# Patient Record
Sex: Female | Born: 1940 | Race: White | Hispanic: No | Marital: Married | Smoking: Current every day smoker
Health system: Southern US, Community
[De-identification: ages and names within clinical notes are randomized; demographics above are authoritative.]

## PROBLEM LIST (undated history)

## (undated) DIAGNOSIS — R000 Tachycardia, unspecified: Secondary | ICD-10-CM

## (undated) DIAGNOSIS — N184 Chronic kidney disease, stage 4 (severe): Secondary | ICD-10-CM

## (undated) DIAGNOSIS — F419 Anxiety disorder, unspecified: Secondary | ICD-10-CM

## (undated) DIAGNOSIS — R51 Headache: Secondary | ICD-10-CM

## (undated) DIAGNOSIS — N390 Urinary tract infection, site not specified: Secondary | ICD-10-CM

## (undated) DIAGNOSIS — I2510 Atherosclerotic heart disease of native coronary artery without angina pectoris: Secondary | ICD-10-CM

## (undated) DIAGNOSIS — Z720 Tobacco use: Secondary | ICD-10-CM

## (undated) DIAGNOSIS — M545 Low back pain, unspecified: Secondary | ICD-10-CM

## (undated) DIAGNOSIS — K219 Gastro-esophageal reflux disease without esophagitis: Secondary | ICD-10-CM

## (undated) DIAGNOSIS — I700 Atherosclerosis of aorta: Secondary | ICD-10-CM

## (undated) DIAGNOSIS — C541 Malignant neoplasm of endometrium: Secondary | ICD-10-CM

## (undated) DIAGNOSIS — F32A Depression, unspecified: Secondary | ICD-10-CM

## (undated) DIAGNOSIS — D72829 Elevated white blood cell count, unspecified: Secondary | ICD-10-CM

## (undated) DIAGNOSIS — C55 Malignant neoplasm of uterus, part unspecified: Secondary | ICD-10-CM

## (undated) DIAGNOSIS — R0602 Shortness of breath: Secondary | ICD-10-CM

## (undated) DIAGNOSIS — Z9981 Dependence on supplemental oxygen: Secondary | ICD-10-CM

## (undated) DIAGNOSIS — G8929 Other chronic pain: Secondary | ICD-10-CM

## (undated) DIAGNOSIS — F329 Major depressive disorder, single episode, unspecified: Secondary | ICD-10-CM

## (undated) DIAGNOSIS — J449 Chronic obstructive pulmonary disease, unspecified: Secondary | ICD-10-CM

## (undated) DIAGNOSIS — R519 Headache, unspecified: Secondary | ICD-10-CM

## (undated) DIAGNOSIS — I739 Peripheral vascular disease, unspecified: Secondary | ICD-10-CM

## (undated) DIAGNOSIS — Z9289 Personal history of other medical treatment: Secondary | ICD-10-CM

## (undated) DIAGNOSIS — K922 Gastrointestinal hemorrhage, unspecified: Secondary | ICD-10-CM

## (undated) DIAGNOSIS — I4510 Unspecified right bundle-branch block: Secondary | ICD-10-CM

## (undated) DIAGNOSIS — E7800 Pure hypercholesterolemia, unspecified: Secondary | ICD-10-CM

## (undated) DIAGNOSIS — I509 Heart failure, unspecified: Secondary | ICD-10-CM

## (undated) DIAGNOSIS — D649 Anemia, unspecified: Secondary | ICD-10-CM

## (undated) DIAGNOSIS — I10 Essential (primary) hypertension: Secondary | ICD-10-CM

## (undated) DIAGNOSIS — I219 Acute myocardial infarction, unspecified: Secondary | ICD-10-CM

## (undated) DIAGNOSIS — M316 Other giant cell arteritis: Secondary | ICD-10-CM

## (undated) DIAGNOSIS — M1990 Unspecified osteoarthritis, unspecified site: Secondary | ICD-10-CM

## (undated) HISTORY — DX: Elevated white blood cell count, unspecified: D72.829

## (undated) HISTORY — DX: Anxiety disorder, unspecified: F41.9

## (undated) HISTORY — DX: Gastro-esophageal reflux disease without esophagitis: K21.9

## (undated) HISTORY — PX: CORONARY ANGIOPLASTY WITH STENT PLACEMENT: SHX49

## (undated) HISTORY — DX: Atherosclerotic heart disease of native coronary artery without angina pectoris: I25.10

## (undated) HISTORY — PX: FEMORAL ARTERY STENT: SHX1583

## (undated) HISTORY — DX: Tachycardia, unspecified: R00.0

## (undated) HISTORY — PX: FOREARM FRACTURE SURGERY: SHX649

## (undated) HISTORY — PX: CAROTID STENT: SHX1301

## (undated) HISTORY — PX: TONSILLECTOMY: SUR1361

## (undated) HISTORY — DX: Dependence on supplemental oxygen: Z99.81

## (undated) HISTORY — DX: Tobacco use: Z72.0

## (undated) HISTORY — DX: Pure hypercholesterolemia, unspecified: E78.00

## (undated) HISTORY — PX: ILIAC ARTERY STENT: SHX1786

## (undated) HISTORY — DX: Unspecified right bundle-branch block: I45.10

## (undated) HISTORY — PX: VAGINAL HYSTERECTOMY: SUR661

## (undated) HISTORY — PX: WRIST FRACTURE SURGERY: SHX121

## (undated) HISTORY — DX: Essential (primary) hypertension: I10

## (undated) HISTORY — DX: Malignant neoplasm of endometrium: C54.1

## (undated) HISTORY — DX: Atherosclerosis of aorta: I70.0

## (undated) HISTORY — DX: Chronic obstructive pulmonary disease, unspecified: J44.9

## (undated) HISTORY — DX: Peripheral vascular disease, unspecified: I73.9

## (undated) HISTORY — PX: REDUCTION MAMMAPLASTY: SUR839

---

## 2000-08-21 ENCOUNTER — Encounter: Payer: Self-pay | Admitting: Family Medicine

## 2000-08-21 ENCOUNTER — Encounter: Admission: RE | Admit: 2000-08-21 | Discharge: 2000-08-21 | Payer: Self-pay | Admitting: Family Medicine

## 2001-10-10 ENCOUNTER — Encounter: Admission: RE | Admit: 2001-10-10 | Discharge: 2001-10-10 | Payer: Self-pay | Admitting: Family Medicine

## 2001-10-10 ENCOUNTER — Encounter: Payer: Self-pay | Admitting: Family Medicine

## 2001-10-23 ENCOUNTER — Encounter: Payer: Self-pay | Admitting: Family Medicine

## 2001-10-23 ENCOUNTER — Ambulatory Visit (HOSPITAL_COMMUNITY): Admission: RE | Admit: 2001-10-23 | Discharge: 2001-10-23 | Payer: Self-pay | Admitting: Family Medicine

## 2001-10-26 ENCOUNTER — Encounter: Payer: Self-pay | Admitting: Family Medicine

## 2001-10-26 ENCOUNTER — Ambulatory Visit (HOSPITAL_COMMUNITY): Admission: RE | Admit: 2001-10-26 | Discharge: 2001-10-26 | Payer: Self-pay | Admitting: Family Medicine

## 2001-11-07 ENCOUNTER — Encounter: Admission: RE | Admit: 2001-11-07 | Discharge: 2001-11-07 | Payer: Self-pay | Admitting: *Deleted

## 2001-11-07 ENCOUNTER — Encounter: Payer: Self-pay | Admitting: *Deleted

## 2001-12-11 ENCOUNTER — Encounter: Admission: RE | Admit: 2001-12-11 | Discharge: 2001-12-11 | Payer: Self-pay | Admitting: Family Medicine

## 2001-12-11 ENCOUNTER — Encounter: Payer: Self-pay | Admitting: Family Medicine

## 2002-02-10 ENCOUNTER — Observation Stay (HOSPITAL_COMMUNITY): Admission: RE | Admit: 2002-02-10 | Discharge: 2002-02-10 | Payer: Self-pay | Admitting: Orthopedic Surgery

## 2002-02-10 ENCOUNTER — Encounter: Payer: Self-pay | Admitting: Orthopedic Surgery

## 2002-06-10 ENCOUNTER — Encounter: Payer: Self-pay | Admitting: *Deleted

## 2002-06-10 ENCOUNTER — Encounter: Admission: RE | Admit: 2002-06-10 | Discharge: 2002-06-10 | Payer: Self-pay | Admitting: *Deleted

## 2006-07-26 ENCOUNTER — Ambulatory Visit (HOSPITAL_COMMUNITY): Admission: RE | Admit: 2006-07-26 | Discharge: 2006-07-26 | Payer: Self-pay | Admitting: Family Medicine

## 2006-11-02 ENCOUNTER — Encounter: Admission: RE | Admit: 2006-11-02 | Discharge: 2006-11-02 | Payer: Self-pay | Admitting: Family Medicine

## 2006-12-20 ENCOUNTER — Encounter (INDEPENDENT_AMBULATORY_CARE_PROVIDER_SITE_OTHER): Payer: Self-pay | Admitting: Gastroenterology

## 2006-12-20 ENCOUNTER — Ambulatory Visit (HOSPITAL_COMMUNITY): Admission: RE | Admit: 2006-12-20 | Discharge: 2006-12-20 | Payer: Self-pay | Admitting: Gastroenterology

## 2007-11-12 ENCOUNTER — Ambulatory Visit: Payer: Self-pay | Admitting: Hematology and Oncology

## 2007-11-13 ENCOUNTER — Ambulatory Visit (HOSPITAL_COMMUNITY): Admission: RE | Admit: 2007-11-13 | Discharge: 2007-11-13 | Payer: Self-pay | Admitting: Hematology and Oncology

## 2007-11-13 LAB — CBC WITH DIFFERENTIAL/PLATELET
BASO%: 0.3 % (ref 0.0–2.0)
Basophils Absolute: 0 10*3/uL (ref 0.0–0.1)
Eosinophils Absolute: 0.1 10*3/uL (ref 0.0–0.5)
HCT: 39.7 % (ref 34.8–46.6)
HGB: 13.5 g/dL (ref 11.6–15.9)
MONO#: 0.7 10*3/uL (ref 0.1–0.9)
NEUT%: 74.1 % (ref 39.6–76.8)
WBC: 10.7 10*3/uL — ABNORMAL HIGH (ref 3.9–10.0)
lymph#: 1.9 10*3/uL (ref 0.9–3.3)

## 2007-11-13 LAB — MORPHOLOGY

## 2007-11-15 LAB — PROTEIN ELECTROPHORESIS, SERUM, WITH REFLEX
Albumin ELP: 52.2 % — ABNORMAL LOW (ref 55.8–66.1)
Alpha-1-Globulin: 5.4 % — ABNORMAL HIGH (ref 2.9–4.9)
Beta 2: 4.6 % (ref 3.2–6.5)
Gamma Globulin: 17.9 % (ref 11.1–18.8)

## 2007-11-15 LAB — COMPREHENSIVE METABOLIC PANEL
CO2: 25 mEq/L (ref 19–32)
Calcium: 9.4 mg/dL (ref 8.4–10.5)
Chloride: 99 mEq/L (ref 96–112)
Creatinine, Ser: 1.41 mg/dL — ABNORMAL HIGH (ref 0.40–1.20)
Glucose, Bld: 86 mg/dL (ref 70–99)
Total Bilirubin: 0.4 mg/dL (ref 0.3–1.2)

## 2007-11-15 LAB — LACTATE DEHYDROGENASE: LDH: 132 U/L (ref 94–250)

## 2007-11-15 LAB — ANA: Anti Nuclear Antibody(ANA): NEGATIVE

## 2007-11-22 LAB — BCR/ABL

## 2008-02-06 ENCOUNTER — Ambulatory Visit: Payer: Self-pay | Admitting: Vascular Surgery

## 2008-03-21 ENCOUNTER — Encounter: Payer: Self-pay | Admitting: Pulmonary Disease

## 2008-03-21 ENCOUNTER — Ambulatory Visit (HOSPITAL_COMMUNITY): Admission: RE | Admit: 2008-03-21 | Discharge: 2008-03-21 | Payer: Self-pay | Admitting: Cardiovascular Disease

## 2008-04-14 ENCOUNTER — Ambulatory Visit: Payer: Self-pay | Admitting: Cardiology

## 2008-04-15 ENCOUNTER — Inpatient Hospital Stay (HOSPITAL_COMMUNITY): Admission: EM | Admit: 2008-04-15 | Discharge: 2008-04-17 | Payer: Self-pay | Admitting: Emergency Medicine

## 2008-05-08 ENCOUNTER — Encounter (HOSPITAL_COMMUNITY): Admission: RE | Admit: 2008-05-08 | Discharge: 2008-06-04 | Payer: Self-pay | Admitting: Cardiovascular Disease

## 2008-05-20 ENCOUNTER — Ambulatory Visit: Payer: Self-pay | Admitting: *Deleted

## 2008-06-11 ENCOUNTER — Ambulatory Visit: Payer: Self-pay | Admitting: Vascular Surgery

## 2008-07-24 ENCOUNTER — Ambulatory Visit: Payer: Self-pay | Admitting: Pulmonary Disease

## 2008-07-24 DIAGNOSIS — I509 Heart failure, unspecified: Secondary | ICD-10-CM | POA: Insufficient documentation

## 2008-07-24 DIAGNOSIS — E785 Hyperlipidemia, unspecified: Secondary | ICD-10-CM

## 2008-07-24 DIAGNOSIS — J438 Other emphysema: Secondary | ICD-10-CM

## 2008-07-24 DIAGNOSIS — I10 Essential (primary) hypertension: Secondary | ICD-10-CM | POA: Insufficient documentation

## 2008-07-24 DIAGNOSIS — J9610 Chronic respiratory failure, unspecified whether with hypoxia or hypercapnia: Secondary | ICD-10-CM | POA: Insufficient documentation

## 2008-07-25 ENCOUNTER — Inpatient Hospital Stay (HOSPITAL_BASED_OUTPATIENT_CLINIC_OR_DEPARTMENT_OTHER): Admission: RE | Admit: 2008-07-25 | Discharge: 2008-07-25 | Payer: Self-pay | Admitting: Cardiovascular Disease

## 2008-07-28 ENCOUNTER — Telehealth (INDEPENDENT_AMBULATORY_CARE_PROVIDER_SITE_OTHER): Payer: Self-pay | Admitting: *Deleted

## 2008-08-15 ENCOUNTER — Ambulatory Visit: Payer: Self-pay | Admitting: Vascular Surgery

## 2008-08-21 ENCOUNTER — Ambulatory Visit: Payer: Self-pay | Admitting: Pulmonary Disease

## 2008-08-30 ENCOUNTER — Encounter: Payer: Self-pay | Admitting: Pulmonary Disease

## 2008-12-10 ENCOUNTER — Ambulatory Visit: Payer: Self-pay | Admitting: Vascular Surgery

## 2008-12-29 ENCOUNTER — Ambulatory Visit: Payer: Self-pay | Admitting: Pulmonary Disease

## 2009-06-10 LAB — LIPID PANEL
Cholesterol: 161 mg/dL (ref 0–200)
Triglycerides: 211 mg/dL — AB (ref 40–160)

## 2009-06-29 ENCOUNTER — Ambulatory Visit: Payer: Self-pay | Admitting: Pulmonary Disease

## 2009-07-01 ENCOUNTER — Ambulatory Visit: Payer: Self-pay | Admitting: Vascular Surgery

## 2009-12-08 ENCOUNTER — Encounter: Admission: RE | Admit: 2009-12-08 | Discharge: 2009-12-08 | Payer: Self-pay | Admitting: Family Medicine

## 2009-12-31 ENCOUNTER — Ambulatory Visit: Payer: Self-pay | Admitting: Internal Medicine

## 2009-12-31 DIAGNOSIS — Z8542 Personal history of malignant neoplasm of other parts of uterus: Secondary | ICD-10-CM

## 2010-01-07 ENCOUNTER — Ambulatory Visit: Admission: RE | Admit: 2010-01-07 | Discharge: 2010-01-07 | Payer: Self-pay | Admitting: Gynecologic Oncology

## 2010-01-07 ENCOUNTER — Telehealth: Payer: Self-pay | Admitting: Pulmonary Disease

## 2010-01-28 ENCOUNTER — Ambulatory Visit: Payer: Self-pay | Admitting: Vascular Surgery

## 2010-02-09 ENCOUNTER — Ambulatory Visit (HOSPITAL_COMMUNITY): Admission: RE | Admit: 2010-02-09 | Discharge: 2010-02-09 | Payer: Self-pay | Admitting: Gynecology

## 2010-02-23 ENCOUNTER — Ambulatory Visit (HOSPITAL_COMMUNITY): Admission: RE | Admit: 2010-02-23 | Discharge: 2010-02-24 | Payer: Self-pay | Admitting: Obstetrics & Gynecology

## 2010-02-23 ENCOUNTER — Encounter: Payer: Self-pay | Admitting: Obstetrics & Gynecology

## 2010-03-15 ENCOUNTER — Ambulatory Visit: Payer: Self-pay | Admitting: Cardiovascular Disease

## 2010-04-08 ENCOUNTER — Ambulatory Visit: Admission: RE | Admit: 2010-04-08 | Discharge: 2010-04-08 | Payer: Self-pay | Admitting: Gynecologic Oncology

## 2010-06-30 ENCOUNTER — Ambulatory Visit: Admit: 2010-06-30 | Payer: Self-pay | Admitting: Pulmonary Disease

## 2010-07-06 ENCOUNTER — Ambulatory Visit: Payer: Self-pay | Admitting: Cardiovascular Disease

## 2010-07-06 NOTE — Assessment & Plan Note (Signed)
Summary: rov for emphysema   Copy to:  [facility] Primary Provider/Referring Provider:  Doristine Counter  CC:  Pt is here for a 6 month f/u appt.  Pt states breathing is unchanged from last visit.  Pt denied any complaints with cough.  Pt denied any new complaints.  Pt is currently smoking 2 cigs per day.  Pt states she is currently only using neb tx once a day but is going to restart bid.  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  She is still smoking, and still not using her nebs compliantly.  She denies any recent acute exacerbation.  She denies signficant cough, purulence, congestion, or worsening sob.  She feels her exertional tolerance is at her usual baseline.  Current Medications (verified): 1)  Adult Aspirin Ec Low Strength 81 Mg Tbec (Aspirin) .... Take 1 Tablet By Mouth Once A Day 2)  Plavix 75 Mg Tabs (Clopidogrel Bisulfate) .... Take 1 Tablet By Mouth Once A Day 3)  Diltiazem Hcl Cr 240 Mg Xr24h-Cap (Diltiazem Hcl) .... Take 1 Tablet By Mouth Once A Day 4)  Effexor Xr 75 Mg Xr24h-Cap (Venlafaxine Hcl) .... Take 1 Tablet By Mouth Once A Day 5)  Hydrochlorothiazide 12.5 Mg Tabs (Hydrochlorothiazide) .... Take 1 Tablet By Mouth Once A Day 6)  Carvedilol 6.25 Mg Tabs (Carvedilol) .... Take 1 Tablet By Mouth Two Times A Day 7)  Nitroglycerin 0.4 Mg Subl (Nitroglycerin) .... As Needed 8)  Ventolin Hfa 108 (90 Base) Mcg/act Aers (Albuterol Sulfate) .... Inhale 2 Puffs Every 4 Hours As Needed 9)  Spiriva Handihaler 18 Mcg  Caps (Tiotropium Bromide Monohydrate) .... One Puff in Handihaler Daily 10)  Brovana 15 Mcg/16ml Nebu (Arformoterol Tartrate) .Marland Kitchen.. 1 in Nebulizer Once Daily 11)  Pulmicort 0.5 Mg/15ml Susp (Budesonide) .Marland Kitchen.. 1 in Nebulizer Once Daily 12)  Pepcid Ac 10 Mg Tabs (Famotidine) .... Take 1 Tablet By Mouth Once A Day 13)  Stool Softner 14)  Laxative  Otc 15)  Tylenol Arthritis Pain 650 Mg Cr-Tabs  (Acetaminophen) 16)  Advil 17)  Nicoderm Cq 21 Mg/24hr Pt24 (Nicotine) .... Take As Directed  Allergies (verified): 1)  ! Tetracycline 2)  ! * Ivp Dye  Social History: Patient states former smoker. Quit smoking 11/09.  Smoked 50+ years 1ppd.  Pt restarted smoking July 2010.  2 cigs per day. 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    Review of Systems      See HPI  Vital Signs:  Patient profile:   70 year old female Height:      68.5 inches Weight:      213.38 pounds BMI:     32.09 O2 Sat:      96 % on 3 LPM pulsed Temp:     98.2 degrees F oral Pulse rate:   85 / minute BP sitting:   130 / 62  (right arm) Cuff size:   large  Vitals Entered By: Arman Filter LPN (June 29, 2009 10:13 AM)  O2 Flow:  3 LPM pulsed CC: Pt is here for a 6 month f/u appt.  Pt states breathing is unchanged from last visit.  Pt denied any complaints with cough.  Pt denied any new complaints.  Pt is currently smoking 2 cigs per day.  Pt states she is currently only using neb tx once a day but is going to restart bid.   Comments Medications reviewed  with patient Arman Filter LPN  June 29, 2009 10:23 AM    Physical Exam  General:  ow female in nad Nose:  no drainage noted. Lungs:  decreased bs throughout, no wheezing or rhonchi Heart:  rrr, no mrg Extremities:  1+ ankle edema, no cyanosis Neurologic:  alert and oriented, moves all 4.   Impression & Recommendations:  Problem # 1:  EMPHYSEMA (ICD-492.8)  the pt has emphysema with chronic respiratory failure.  She is near her usual baseline, but unfortunately is still smoking and not staying compliant with her meds.  We are doing everything we can medically, and further improvement will only come with behavioral changes.  She will f/u with me in 6mos.  Medications Added to Medication List This Visit: 1)  Brovana 15 Mcg/34ml Nebu (Arformoterol tartrate) .Marland Kitchen.. 1 in nebulizer once daily 2)  Pulmicort 0.5 Mg/38ml Susp (Budesonide) .Marland Kitchen..  1 in nebulizer once daily 3)  Nicoderm Cq 21 Mg/24hr Pt24 (Nicotine) .... Take as directed  Other Orders: Est. Patient Level III (28413)  Patient Instructions: 1)  stop smoking 100% 2)  stay on nebs two times a day, along with other meds. 3)  consider pulmonary rehab program 4)  followup with me in 6mos.   Immunization History:  Influenza Immunization History:    Influenza:  historical (03/06/2009)  Pneumovax Immunization History:    Pneumovax:  historical (06/07/2007)

## 2010-07-06 NOTE — Progress Notes (Signed)
Summary: surgical clearance- lmtcb x 1   Phone Note From Other Clinic Call back at [phone]   Caller: gyn oncology ( nancy Call For: clance Summary of Call: pt need surgical clearance Initial call taken by: Rickard Patience,  January 07, 2010 4:31 PM  Follow-up for Phone Call        pt was last seen by [facility] on 12-31-2009.  called and spoke with nurse, Harriett Sine, who states pt has endometrial cancer and they are wanting to do a vaginal hysterectomy.  Surgery has not been scheduled yet.  Harriett Sine is aware KC out of office this week and will return next week.  Harriett Sine was ok for this message to wait until Dr. Shelle Iron returned to the office next week.  Arman Filter LPN  January 07, 2010 4:44 PM   Additional Follow-up for Phone Call Additional follow up Details #1::        megan, she was cleared in my last note.  Can fax that to them. Additional Follow-up by: Barbaraann Share MD,  January 08, 2010 9:14 AM    Additional Follow-up for Phone Call Additional follow up Details #2::    [facility]- we just need a fax # Vernie Murders  January 08, 2010 9:28 AM   called and fax number is [phone]----the last ov note has been faxed over for surgical clearance---ok per [facility]. Randell Loop CMA  January 08, 2010 10:21 AM

## 2010-07-06 NOTE — Assessment & Plan Note (Signed)
Summary: rov for emphysema   Copy to:  [facility] Primary Provider/Referring Provider:  Doristine Counter  CC:  Pt is here for a 6 month f/u appt on her emphysema.   Pt states breathing is unchanged from last visit.  still has sob with exertion.  Pt c/o chest congestion and coughing up white sputum.  Pt states she has increased smoking but refused to tell me how much.  Pt states she has recently been dx with uterine cancer. Marland Kitchen  History of Present Illness: the pt comes in today for f/u of her known emphysema with chronic respiratory failure.  She is staying compliant with her med changes and her oxygen therapy.  Unfortunately, she continues to smoke, and has a chronic cough with white foamy mucus.  She has recently been diagnosed with uterine cancer, and may need hysterectomy.  Current Medications (verified): 1)  Adult Aspirin Ec Low Strength 81 Mg Tbec (Aspirin) .... Take 1 Tablet By Mouth Once A Day 2)  Plavix 75 Mg Tabs (Clopidogrel Bisulfate) .... Take 1 Tablet By Mouth Once A Day 3)  Diltiazem Hcl Cr 240 Mg Xr24h-Cap (Diltiazem Hcl) .... Take 1 Tablet By Mouth Once A Day 4)  Effexor Xr 75 Mg Xr24h-Cap (Venlafaxine Hcl) .... Take 1 Tablet By Mouth Once A Day 5)  Hydrochlorothiazide 12.5 Mg Tabs (Hydrochlorothiazide) .... Take 1 Tablet By Mouth Once A Day 6)  Carvedilol 6.25 Mg Tabs (Carvedilol) .... Take 1 Tablet By Mouth Two Times A Day 7)  Nitroglycerin 0.4 Mg Subl (Nitroglycerin) .... As Needed 8)  Ventolin Hfa 108 (90 Base) Mcg/act Aers (Albuterol Sulfate) .... Inhale 2 Puffs Every 4 Hours As Needed 9)  Spiriva Handihaler 18 Mcg  Caps (Tiotropium Bromide Monohydrate) .... One Puff in Handihaler Daily 10)  Brovana 15 Mcg/71ml Nebu (Arformoterol Tartrate) .Marland Kitchen.. 1 Vial in Nebulizer Two Times A Day 11)  Pulmicort 0.5 Mg/94ml Susp (Budesonide) .Marland Kitchen.. 1 Vial Via Nebulizer Two Times A Day 12)  Pepcid Ac 10 Mg Tabs (Famotidine) .... Take 1 Tablet By Mouth Once A Day As Needed 13)  Stool Softner 14)  Laxative   Otc 15)  Tylenol Arthritis Pain 650 Mg Cr-Tabs (Acetaminophen) 16)  Advil 17)  Iron 325 (65 Fe) Mg Tabs (Ferrous Sulfate) .... Take 1 Tablet By Mouth Once A Day 18)  Vitamin C 500 Mg Tabs (Ascorbic Acid) .... Take 1 Tablet By Mouth Once A Day 19)  Vitamin D 500 .... Take 1 Tablet By Mouth Once A Day  Allergies (verified): 1)  ! Tetracycline 2)  ! * Ivp Dye  Past History:  Past Medical History: Emphysema Hyperlipidemia Hypertension Myocardial Infarction/ PCI 11/09 by [facility]--s/p stenting iliacs UTERINE CANCER (ICD-V10.42)  Review of Systems       The patient complains of shortness of breath with activity, productive cough, acid heartburn, indigestion, headaches, and hand/feet swelling.  The patient denies shortness of breath at rest, non-productive cough, coughing up blood, chest pain, irregular heartbeats, loss of appetite, weight change, abdominal pain, difficulty swallowing, sore throat, tooth/dental problems, nasal congestion/difficulty breathing through nose, sneezing, itching, ear ache, anxiety, depression, joint stiffness or pain, rash, change in color of mucus, and fever.    Vital Signs:  Patient profile:   70 year old female Height:      68.5 inches Weight:      212.25 pounds BMI:     31.92 O2 Sat:      98 % on 3 LPM pulsed Temp:     97.8 degrees F  oral Pulse rate:   82 / minute BP sitting:   130 / 70  (right arm) Cuff size:   large  Vitals Entered By: Arman Filter LPN (December 31, 2009 3:58 PM)  O2 Flow:  3 LPM pulsed CC: Pt is here for a 6 month f/u appt on her emphysema.   Pt states breathing is unchanged from last visit.  still has sob with exertion.  Pt c/o chest congestion and coughing up white sputum.  Pt states she has increased smoking but refused to tell me how much.  Pt states she has recently been dx with uterine cancer.  Comments Medications reviewed with patient Arman Filter LPN  December 31, 2009 3:58 PM    Physical Exam  General:  obese female  in nad Lungs:  decreased bs throughout, no wheezing or rhonchi Heart:  rrr Extremities:  mild LE edema, no cyanosis Neurologic:  alert and oriented, moves all 4.   Impression & Recommendations:  Problem # 1:  EMPHYSEMA (ICD-492.8)  the pt has known emphysema, and is on a very good bronchodilator regimen.  She continues to smoke, and understands that she will not improve until she totally quits.  I have asked her to stay on current meds, and to work on smoking cessation.  She has no pulmonary contraindications for hysterectomy, but is at moderate to high risk for postop pulmonary complications.  Problem # 2:  CHRONIC RESPIRATORY FAILURE (ICD-518.83)  the pt has adequate sats on current supplemental oxygen.     Medications Added to Medication List This Visit: 1)  Pepcid Ac 10 Mg Tabs (Famotidine) .... Take 1 tablet by mouth once a day as needed 2)  Iron 325 (65 Fe) Mg Tabs (Ferrous sulfate) .... Take 1 tablet by mouth once a day 3)  Vitamin C 500 Mg Tabs (Ascorbic acid) .... Take 1 tablet by mouth once a day 4)  Vitamin D 500  .... Take 1 tablet by mouth once a day  Other Orders: Est. Patient Level III (16109)  Patient Instructions: 1)  no change in meds. 2)  stop smoking.  Don't forget those who stop smoking 2 weeks prior to general anesthesia have better outcomes. 3)  followup with me in 6mos.

## 2010-07-14 ENCOUNTER — Encounter: Payer: Self-pay | Admitting: Pulmonary Disease

## 2010-07-14 ENCOUNTER — Ambulatory Visit (INDEPENDENT_AMBULATORY_CARE_PROVIDER_SITE_OTHER): Payer: Federal, State, Local not specified - PPO | Admitting: Pulmonary Disease

## 2010-07-14 DIAGNOSIS — J438 Other emphysema: Secondary | ICD-10-CM

## 2010-07-14 DIAGNOSIS — J9610 Chronic respiratory failure, unspecified whether with hypoxia or hypercapnia: Secondary | ICD-10-CM

## 2010-07-16 ENCOUNTER — Telehealth (INDEPENDENT_AMBULATORY_CARE_PROVIDER_SITE_OTHER): Payer: Self-pay | Admitting: *Deleted

## 2010-07-22 ENCOUNTER — Ambulatory Visit (INDEPENDENT_AMBULATORY_CARE_PROVIDER_SITE_OTHER): Payer: Federal, State, Local not specified - PPO | Admitting: Cardiovascular Disease

## 2010-07-22 DIAGNOSIS — I119 Hypertensive heart disease without heart failure: Secondary | ICD-10-CM

## 2010-07-22 DIAGNOSIS — J449 Chronic obstructive pulmonary disease, unspecified: Secondary | ICD-10-CM

## 2010-07-22 NOTE — Assessment & Plan Note (Signed)
Summary: rov for emphysema, chronic respiratory failure   Copy to:  [facility] Primary Provider/Referring Provider:  Doristine Counter  CC:  6 month f/u appt for Emphysema.   Pt requesting 90 day rx for Spiriva and Brovana and Pulmicort.  And also requests just a 30 day supply for Ventolin.  Currently still smoking "at least 10 cigs a day."   C/o increased sob with exertion and coughing up clear to yellow thick sputum. Marland Kitchen  History of Present Illness: the pt comes in today for f/u of her known emphysema with chronic respiratory failure.  She is staying on her bronchodilator regimen, but is only using once a day instead of prescribed twice a day.  She is staying on her oxygen.  She feels her exertional tolerance is at her usual baseline, and she continues with mild cough productive of scant mucus due to ongoing smoking.    Preventive Screening-Counseling & Management  Alcohol-Tobacco     Smoking Status: current     Smoking Cessation Counseling: yes     Packs/Day: 0.5     Tobacco Counseling: to quit use of tobacco products  Current Medications (verified): 1)  Adult Aspirin Ec Low Strength 81 Mg Tbec (Aspirin) .... Take 1 Tablet By Mouth Once A Day 2)  Plavix 75 Mg Tabs (Clopidogrel Bisulfate) .... Take 1 Tablet By Mouth Once A Day 3)  Diltiazem Hcl Cr 240 Mg Xr24h-Cap (Diltiazem Hcl) .... Take 1 Tablet By Mouth Once A Day 4)  Effexor Xr 75 Mg Xr24h-Cap (Venlafaxine Hcl) .... Take 1 Tablet By Mouth Once A Day 5)  Hydrochlorothiazide 12.5 Mg Tabs (Hydrochlorothiazide) .... Take 1 Tablet By Mouth Once A Day 6)  Carvedilol 6.25 Mg Tabs (Carvedilol) .... Take 1 Tablet By Mouth Two Times A Day 7)  Nitroglycerin 0.4 Mg Subl (Nitroglycerin) .... As Needed 8)  Ventolin Hfa 108 (90 Base) Mcg/act Aers (Albuterol Sulfate) .... Inhale 2 Puffs Every 4 Hours As Needed 9)  Spiriva Handihaler 18 Mcg  Caps (Tiotropium Bromide Monohydrate) .... One Puff in Handihaler Daily 10)  Brovana 15 Mcg/35ml Nebu (Arformoterol  Tartrate) .Marland Kitchen.. 1 Vial in Nebulizer Once Daily 11)  Pulmicort 0.5 Mg/62ml Susp (Budesonide) .Marland Kitchen.. 1 Vial Via Nebulizer Once Daily 12)  Pepcid Ac 10 Mg Tabs (Famotidine) .... Take 1 Tablet By Mouth Once A Day As Needed 13)  Stool Softner 14)  Laxative  Otc 15)  Tylenol Arthritis Pain 650 Mg Cr-Tabs (Acetaminophen) 16)  Advil 17)  Vitamin C 500 Mg Tabs (Ascorbic Acid) .... Take 1 Tablet By Mouth Once A Day 18)  Vitamin D 500 .... Take 1 Tablet By Mouth Once A Day 19)  Amlodipine Besylate 5 Mg Tabs (Amlodipine Besylate) .... Take 1/2 Tab By Mouth Daily 20)  Allopurinol 100 Mg Tabs (Allopurinol) .... Take 1 Tablet By Mouth Two Times A Day  Allergies (verified): 1)  ! Tetracycline 2)  ! * Ivp Dye  Social History: Patient states former smoker. Quit smoking 11/09.  Smoked 50+ years 1ppd.  Pt restarted smoking July 2010.  10 cigs a day. 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   Smoking Status:  current Packs/Day:  0.5  Review of Systems       The patient complains of shortness of breath with activity, productive cough, acid heartburn, indigestion, loss of appetite, weight change, anxiety, hand/feet swelling, and change in color of mucus.  The patient denies shortness of breath at rest, non-productive cough, coughing up blood, chest pain, irregular heartbeats, abdominal pain,  difficulty swallowing, sore throat, tooth/dental problems, headaches, nasal congestion/difficulty breathing through nose, sneezing, itching, ear ache, depression, joint stiffness or pain, rash, and fever.    Vital Signs:  Patient profile:   70 year old female Height:      68.5 inches Weight:      196.13 pounds BMI:     29.49 O2 Sat:      96 % on 3 LPM pulsed Temp:     97.7 degrees F oral Pulse rate:   81 / minute BP sitting:   134 / 72  (right arm) Cuff size:   large  Vitals Entered By: Arman Filter LPN (July 14, 2010 10:49 AM)  O2 Flow:  3 LPM pulsed CC: 6 month f/u appt for Emphysema.   Pt  requesting 90 day rx for Spiriva, Brovana and Pulmicort.  And also requests just a 30 day supply for Ventolin.  Currently still smoking "at least 10 cigs a day."   C/o increased sob with exertion and coughing up clear to yellow thick sputum.  Comments Medications reviewed with patient Arman Filter LPN  July 14, 2010 10:50 AM    Physical Exam  General:  wd female in nad Lungs:  decreased bs throughout, no wheezing noted. Heart:  rrr, no mrg Extremities:  mild ankle edema, no cyanosis  Neurologic:  alert and oriented, moves all 4.   Impression & Recommendations:  Problem # 1:  EMPHYSEMA (ICD-492.8)  the pt has known emphysema with chronic respiratory failure.  She is on a very good bronchodilator regimen, but unfortunately continues to smoke.  She understands that her breathing will never improve until she quits smoking, and that she is prone to recurrent pulmonary infections as well.  I have asked her to take her neb treatments twice a day , and to stay as active as possible.    Medications Added to Medication List This Visit: 1)  Brovana 15 Mcg/67ml Nebu (Arformoterol tartrate) .Marland Kitchen.. 1 vial in nebulizer once daily 2)  Pulmicort 0.5 Mg/39ml Susp (Budesonide) .Marland Kitchen.. 1 vial via nebulizer once daily 3)  Amlodipine Besylate 5 Mg Tabs (Amlodipine besylate) .... Take 1/2 tab by mouth daily 4)  Allopurinol 100 Mg Tabs (Allopurinol) .... Take 1 tablet by mouth two times a day  Other Orders: Tobacco use cessation intermediate 3-10 minutes (99406) Est. Patient Level III (04540)  Patient Instructions: 1)  stop smoking! 2)  no change in pulmonary meds, but take brovana/budesonide twice a day. 3)  followup with me in 6mos, or sooner if having issues.   Prescriptions: VENTOLIN HFA 108 (90 BASE) MCG/ACT AERS (ALBUTEROL SULFATE) Inhale 2 puffs every 4 hours as needed  #1 x 11   Entered and Authorized by:   Barbaraann Share MD   Signed by:   Barbaraann Share MD on 07/14/2010   Method used:   Print  then Give to Patient   RxID:   [id number] PULMICORT 0.5 MG/2ML SUSP (BUDESONIDE) 1 vial via nebulizer once daily  #180 x 4   Entered and Authorized by:   Barbaraann Share MD   Signed by:   Barbaraann Share MD on 07/14/2010   Method used:   Print then Give to Patient   RxID:   [id number] 15 MCG/2ML NEBU (ARFORMOTEROL TARTRATE) 1 vial in nebulizer once daily  #180 x 4   Entered and Authorized by:   Barbaraann Share MD   Signed by:   Barbaraann Share MD on 07/14/2010  Method used:   Print then Give to Patient   RxID:   [id number] SPIRIVA HANDIHALER 18 MCG  CAPS (TIOTROPIUM BROMIDE MONOHYDRATE) one puff in handihaler daily  #90 x 4   Entered and Authorized by:   Barbaraann Share MD   Signed by:   Barbaraann Share MD on 07/14/2010   Method used:   Print then Give to Patient   RxID:   [id number]

## 2010-07-22 NOTE — Progress Notes (Signed)
Summary: clarrification on med instructions  Phone Note Call from Patient Call back at Home Phone [phone]   Caller: Spouse Roger  Call For: clance Summary of Call: spouse wants to know if there is a generic for spiriva avail through [employer]. ALSO: wants to know if there is a generic (or a cheaper version) of pulmicort, ALSO: wants clarification re: directions for brovana as he thought pt was to take 2 vials daily.  Initial call taken by: Tivis Ringer, CNA,  July 16, 2010 11:54 AM  Follow-up for Phone Call        Called, spoke with pt's spouse, Fredrik Cove.  He is requesting clarrification on brovana and budesonide directions.  Per pt instructions from 07/14/10, no change in pulmonary meds, but take brovana/budesonide twice a day.  However, the rxs were both written for once daily.  KC, pls advise if pt is supposed to take these meds once or twice daily.  Thanks! Follow-up by: Gweneth Dimitri RN,  July 16, 2010 2:03 PM  Additional Follow-up for Phone Call Additional follow up Details #1::        brovana and budesonide are supposed to be two times a day please send a new rx if needed. Additional Follow-up by: Barbaraann Share MD,  July 16, 2010 4:31 PM    Additional Follow-up for Phone Call Additional follow up Details #2::    Called, spoke with pt.  She was informed of above per [facility] and verbalized understanding of this.  Requesting to pick up new rxs so they can send them in to mail order when ready.  Rxs printed and signed by [facility].  They were placed at front for pick up -- pt aware. Follow-up by: Gweneth Dimitri RN,  July 16, 2010 4:52 PM  New/Updated Medications: BROVANA 15 MCG/2ML NEBU (ARFORMOTEROL TARTRATE) 1 vial in nebulizer two times a day PULMICORT 0.5 MG/2ML SUSP (BUDESONIDE) 1 vial via nebulizer two times a day Prescriptions: PULMICORT 0.5 MG/2ML SUSP (BUDESONIDE) 1 vial via nebulizer two times a day  #180 x 0   Entered by:   Gweneth Dimitri RN   Authorized by:    Barbaraann Share MD   Signed by:   Gweneth Dimitri RN on 07/16/2010   Method used:   Print then Give to Patient   RxID:   [id number] BROVANA 15 MCG/2ML NEBU (ARFORMOTEROL TARTRATE) 1 vial in nebulizer two times a day  #180 x 0   Entered by:   Gweneth Dimitri RN   Authorized by:   Barbaraann Share MD   Signed by:   Gweneth Dimitri RN on 07/16/2010   Method used:   Print then Give to Patient   RxID:   [id number]

## 2010-08-10 ENCOUNTER — Encounter: Payer: Self-pay | Admitting: Cardiovascular Disease

## 2010-08-10 DIAGNOSIS — Z9981 Dependence on supplemental oxygen: Secondary | ICD-10-CM | POA: Insufficient documentation

## 2010-08-10 DIAGNOSIS — K219 Gastro-esophageal reflux disease without esophagitis: Secondary | ICD-10-CM | POA: Insufficient documentation

## 2010-08-10 DIAGNOSIS — R000 Tachycardia, unspecified: Secondary | ICD-10-CM | POA: Insufficient documentation

## 2010-08-10 DIAGNOSIS — I4510 Unspecified right bundle-branch block: Secondary | ICD-10-CM | POA: Insufficient documentation

## 2010-08-10 DIAGNOSIS — Z720 Tobacco use: Secondary | ICD-10-CM | POA: Insufficient documentation

## 2010-08-10 DIAGNOSIS — I700 Atherosclerosis of aorta: Secondary | ICD-10-CM | POA: Insufficient documentation

## 2010-08-10 DIAGNOSIS — J449 Chronic obstructive pulmonary disease, unspecified: Secondary | ICD-10-CM | POA: Insufficient documentation

## 2010-08-10 DIAGNOSIS — F419 Anxiety disorder, unspecified: Secondary | ICD-10-CM | POA: Insufficient documentation

## 2010-08-10 DIAGNOSIS — N289 Disorder of kidney and ureter, unspecified: Secondary | ICD-10-CM | POA: Insufficient documentation

## 2010-08-10 DIAGNOSIS — I739 Peripheral vascular disease, unspecified: Secondary | ICD-10-CM | POA: Insufficient documentation

## 2010-08-10 DIAGNOSIS — I2510 Atherosclerotic heart disease of native coronary artery without angina pectoris: Secondary | ICD-10-CM | POA: Insufficient documentation

## 2010-08-18 ENCOUNTER — Ambulatory Visit (INDEPENDENT_AMBULATORY_CARE_PROVIDER_SITE_OTHER): Payer: Federal, State, Local not specified - PPO | Admitting: Nurse Practitioner

## 2010-08-18 ENCOUNTER — Other Ambulatory Visit: Payer: Self-pay | Admitting: Cardiovascular Disease

## 2010-08-18 DIAGNOSIS — I2510 Atherosclerotic heart disease of native coronary artery without angina pectoris: Secondary | ICD-10-CM

## 2010-08-18 DIAGNOSIS — I10 Essential (primary) hypertension: Secondary | ICD-10-CM

## 2010-08-18 DIAGNOSIS — N189 Chronic kidney disease, unspecified: Secondary | ICD-10-CM

## 2010-08-18 DIAGNOSIS — N289 Disorder of kidney and ureter, unspecified: Secondary | ICD-10-CM

## 2010-08-19 LAB — CBC
HCT: 31.8 % — ABNORMAL LOW (ref 36.0–46.0)
HCT: 32.8 % — ABNORMAL LOW (ref 36.0–46.0)
Hemoglobin: 10.6 g/dL — ABNORMAL LOW (ref 12.0–15.0)
MCH: 30 pg (ref 26.0–34.0)
MCH: 30.2 pg (ref 26.0–34.0)
MCHC: 33.6 g/dL (ref 30.0–36.0)
MCV: 89.8 fL (ref 78.0–100.0)
MCV: 90.5 fL (ref 78.0–100.0)
Platelets: 269 10*3/uL (ref 150–400)
RBC: 3.52 MIL/uL — ABNORMAL LOW (ref 3.87–5.11)
RDW: 15.6 % — ABNORMAL HIGH (ref 11.5–15.5)
WBC: 10.7 10*3/uL — ABNORMAL HIGH (ref 4.0–10.5)

## 2010-08-19 LAB — SAMPLE TO BLOOD BANK

## 2010-08-19 LAB — COMPREHENSIVE METABOLIC PANEL
Albumin: 3.6 g/dL (ref 3.5–5.2)
Alkaline Phosphatase: 71 U/L (ref 39–117)
BUN: 21 mg/dL (ref 6–23)
Creatinine, Ser: 1.65 mg/dL — ABNORMAL HIGH (ref 0.4–1.2)
Glucose, Bld: 104 mg/dL — ABNORMAL HIGH (ref 70–99)
Potassium: 4.7 mEq/L (ref 3.5–5.1)
Total Protein: 7.6 g/dL (ref 6.0–8.3)

## 2010-08-19 LAB — DIFFERENTIAL
Basophils Absolute: 0 10*3/uL (ref 0.0–0.1)
Basophils Relative: 0 % (ref 0–1)
Lymphocytes Relative: 14 % (ref 12–46)
Monocytes Relative: 7 % (ref 3–12)
Neutro Abs: 8.3 10*3/uL — ABNORMAL HIGH (ref 1.7–7.7)
Neutrophils Relative %: 77 % (ref 43–77)

## 2010-08-19 LAB — BASIC METABOLIC PANEL
Creatinine, Ser: 1.55 mg/dL — ABNORMAL HIGH (ref 0.4–1.2)
Sodium: 137 mEq/L (ref 135–145)

## 2010-08-19 LAB — SURGICAL PCR SCREEN: MRSA, PCR: NEGATIVE

## 2010-08-19 LAB — CREATININE, SERUM: GFR calc non Af Amer: 36 mL/min — ABNORMAL LOW (ref >60)

## 2010-08-20 ENCOUNTER — Other Ambulatory Visit: Payer: Federal, State, Local not specified - PPO

## 2010-08-23 ENCOUNTER — Other Ambulatory Visit: Payer: Federal, State, Local not specified - PPO

## 2010-08-24 ENCOUNTER — Ambulatory Visit
Admission: RE | Admit: 2010-08-24 | Discharge: 2010-08-24 | Disposition: A | Discharge status: Home / Self Care | Payer: Federal, State, Local not specified - PPO | Source: Ambulatory Visit | Attending: Cardiovascular Disease | Admitting: Cardiovascular Disease

## 2010-08-24 DIAGNOSIS — N289 Disorder of kidney and ureter, unspecified: Secondary | ICD-10-CM

## 2010-08-27 ENCOUNTER — Other Ambulatory Visit: Payer: Self-pay | Admitting: Cardiovascular Disease

## 2010-08-27 DIAGNOSIS — I10 Essential (primary) hypertension: Secondary | ICD-10-CM

## 2010-08-30 ENCOUNTER — Encounter (INDEPENDENT_AMBULATORY_CARE_PROVIDER_SITE_OTHER): Payer: Federal, State, Local not specified - PPO | Admitting: Cardiology

## 2010-08-30 DIAGNOSIS — I10 Essential (primary) hypertension: Secondary | ICD-10-CM

## 2010-09-03 ENCOUNTER — Telehealth: Payer: Self-pay | Admitting: Pulmonary Disease

## 2010-09-03 ENCOUNTER — Encounter: Payer: Self-pay | Admitting: Cardiovascular Disease

## 2010-09-03 ENCOUNTER — Ambulatory Visit (INDEPENDENT_AMBULATORY_CARE_PROVIDER_SITE_OTHER): Payer: Federal, State, Local not specified - PPO | Admitting: Cardiovascular Disease

## 2010-09-03 VITALS — BP 150/70 | HR 72 | Wt 195.0 lb

## 2010-09-03 DIAGNOSIS — I10 Essential (primary) hypertension: Secondary | ICD-10-CM

## 2010-09-03 DIAGNOSIS — I739 Peripheral vascular disease, unspecified: Secondary | ICD-10-CM

## 2010-09-03 DIAGNOSIS — I2510 Atherosclerotic heart disease of native coronary artery without angina pectoris: Secondary | ICD-10-CM

## 2010-09-03 NOTE — Patient Instructions (Addendum)
Stop smoking.  Call us back sooner if the leg pain persists or if you develop discoloration on your feet or toes.

## 2010-09-03 NOTE — Assessment & Plan Note (Signed)
She's not had any episodes of chest pain. I've asked her to stop smoking.

## 2010-09-03 NOTE — Progress Notes (Signed)
History of Present Illness: Wanda Yang is an elderly female with a history of coronary artery disease. She is status post PTCA and stenting of her left circumflex artery as well as a right coronary artery. She also has a history of severe COPD and is on home oxygen. Despite this she continues to smoke. She has a history of hypertension.  She also complains of some leg pain. It was actually her husband who told me about the leg pain. She admits Not been able to walk much because the pain but she really did not want to have anything done about that at this time.  She was seen by a very last week. Because of her high blood pressure she had a renal ultrasound which revealed a small right renal kidney. She was told that it may be nonfunctional.  Current Outpatient Prescriptions on File Prior to Visit  Medication Sig Dispense Refill  . ALBUTEROL IN Inhale into the lungs as needed.        Marland Kitchen arformoterol (BROVANA) 15 MCG/2ML NEBU Take 15 mcg by nebulization 2 (two) times daily.        . Ascorbic Acid (VITAMIN C) 500 MG tablet Take 500 mg by mouth daily.        Marland Kitchen aspirin 81 MG tablet Take 81 mg by mouth daily.        . budesonide (PULMICORT) 0.5 MG/2ML nebulizer solution Take 0.5 mg by nebulization 2 (two) times daily.        . cholecalciferol (VITAMIN D-400) 400 UNITS TABS Take 400 Units by mouth daily.        . clopidogrel (PLAVIX) 75 MG tablet Take 75 mg by mouth daily.        Marland Kitchen diltiazem (DILACOR XR) 240 MG 24 hr capsule Take 240 mg by mouth daily.        . Famotidine (PEPCID PO) Take by mouth as needed.        . nebivolol (BYSTOLIC) 10 MG tablet Take 5 mg by mouth daily.       . nitroGLYCERIN (NITROSTAT) 0.4 MG SL tablet Place 0.4 mg under the tongue every 5 (five) minutes as needed.        . OXYGEN-HELIUM IN Inhale 2 L into the lungs daily.        . rosuvastatin (CRESTOR) 10 MG tablet Take 5 mg by mouth daily.        Marland Kitchen tiotropium (SPIRIVA) 18 MCG inhalation capsule Place 18 mcg into inhaler and inhale  daily.        Marland Kitchen venlafaxine (EFFEXOR) 75 MG tablet Take 75 mg by mouth daily.        Marland Kitchen allopurinol (ZYLOPRIM) 100 MG tablet Take 100 mg by mouth 2 (two) times daily.        Marland Kitchen amLODipine (NORVASC) 5 MG tablet Take 5 mg by mouth daily.        . furosemide (LASIX) 40 MG tablet Take 40 mg by mouth every other day as needed.        . hydrochlorothiazide 25 MG tablet Take 25 mg by mouth daily.        Marland Kitchen HYDROCODONE-HOMATROPINE PO Take by mouth as needed.        . potassium chloride (KLOR-CON) 10 MEQ CR tablet Take 10 mEq by mouth daily.          Allergies  Allergen Reactions  . Ambien   . Uloric (Febuxostat)   . Vicodin (Hydrocodone-Acetaminophen)   . Tetracycline Rash  Past Medical History  Diagnosis Date  . CAD (coronary artery disease)   . Atherosclerosis of abdominal aorta   . On home oxygen therapy   . Tobacco abuse   . HTN (hypertension)   . COPD (chronic obstructive pulmonary disease)   . PVD (peripheral vascular disease)   . Anxiety   . GERD (gastroesophageal reflux disease)   . RBBB (right bundle branch block)   . Tachycardia   . High cholesterol   . Renal insufficiency   . Leukocytosis   . Endometrial carcinoma     Past Surgical History  Procedure Date  . Coronary angioplasty   . Abdominal hysterectomy   . Carotid stent   . Tonsillectomy   . Arm surgery     PLATES AND SCREWS  . Breast reduction surgery     History  Smoking status  . Former Smoker  . Types: Cigarettes  . Quit date: 04/06/2008  Smokeless tobacco  . Not on file    History  Alcohol Use No    Family History  Problem Relation Age of Onset  . Heart attack Father   . Coronary artery disease Father   . Leukemia Mother   . Leukemia Brother     Reviw of Systems:  The patient denies any heat or cold intolerance.  No weight gain or weight loss.  The patient denies headaches or blurry vision.  There is no cough or sputum production.  The patient denies dizziness.  There is no hematuria or  hematochezia.  The patient denies any muscle aches or arthritis.  The patient denies any rash.  The patient denies frequent falling or instability.  There is no history of depression or anxiety.  All other systems were reviewed and are negative.  Physical Exam: BP 150/70  Pulse 72  Wt 195 lb (88.451 kg) The patient is alert and oriented x 3.  The mood and affect are normal.  The skin is warm and dry.  Color is normal.  The HEENT exam reveals that the sclera are nonicteric.  The mucous membranes are moist.  The carotids are 2+ without bruits.  There is no thyromegaly.  There is no JVD.  The lungs are clear.  The chest wall is non tender.  The heart exam reveals a regular rate with a normal S1 and S2.  There are no murmurs, gallops, or rubs.  The PMI is not displaced.   Abdominal exam reveals good bowel sounds.  There is no guarding or rebound.  There is no hepatosplenomegaly or tenderness.  There are no masses.  Exam of the legs reveal no clubbing, cyanosis, or edema.  The legs are without rashes.  The distal pulses Very weak.  Cranial nerves II - XII are intact.  Motor and sensory functions are intact.  The gait is normal.  ECG:  Assessment / Plan:

## 2010-09-03 NOTE — Telephone Encounter (Signed)
Faxed Renal Artery Duplex to Amy at [facility] ([phone]).

## 2010-09-03 NOTE — Assessment & Plan Note (Signed)
Her leg pains are most likely due to claudication. She was not interested in following up with any additional studies at this time. She has seen Dr. Darrick Penna in the past for peripheral vascular disease. I've encouraged her to call him if she has any worsening of her claudication.

## 2010-09-03 NOTE — Assessment & Plan Note (Signed)
Her blood pressure remains elevated. I told her that much of this because she continues to smoke. She has severe peripheral vascular disease I suspect. We will continue with her same medications. I've asked her to watch her salt intake. She has a moderately elevated creatinine and her renal dysfunction is also starting to affect her blood pressure.

## 2010-09-09 ENCOUNTER — Encounter: Payer: Self-pay | Admitting: Cardiovascular Disease

## 2010-09-24 ENCOUNTER — Telehealth: Payer: Self-pay | Admitting: Cardiovascular Disease

## 2010-09-24 NOTE — Telephone Encounter (Signed)
Faxed Renal Arterial Duplex to Diane @ [facility] ([phone]).

## 2010-10-06 ENCOUNTER — Encounter (HOSPITAL_COMMUNITY): Payer: Federal, State, Local not specified - PPO | Attending: Nephrology

## 2010-10-06 DIAGNOSIS — D509 Iron deficiency anemia, unspecified: Secondary | ICD-10-CM | POA: Insufficient documentation

## 2010-10-13 ENCOUNTER — Encounter (HOSPITAL_COMMUNITY): Payer: Federal, State, Local not specified - PPO

## 2010-10-15 ENCOUNTER — Encounter: Payer: Self-pay | Admitting: Cardiovascular Disease

## 2010-10-19 NOTE — Assessment & Plan Note (Signed)
OFFICE VISIT   Wanda Yang, Wanda Yang  DOB:  1940/10/04                                       12/10/2008  [id number]#:[id number]   Wanda Yang returns for followup today.  We have been following her for  bilateral lower extremity claudication.  She also has significant COPD  and now requires 3 liters of oxygen continuously.  Unfortunately, she  continues to smoke.  I cautioned her against smoking during the use of  home oxygen therapy today.  Hopefully, she will not continue to do this  in the future due to the fire risk.   She states that she still has some symptoms in her right leg consistent  with claudication.  This usually occurs at 1 to 2 blocks.  She states  that these usually occurs before shortness of breath.  She does not feel  significantly debilitated by her symptoms but overall she is a little  bit depressed due to her multiple comorbidities and she is not able to  do the things that she wishes she could do secondary to her shortness of  breath and claudication symptoms.   On physical exam today, blood pressure is 148/79 in the right arm, pulse  is 90 and regular.  O2 saturations 94% on 3 liters O2.  Lower extremity  exam shows 2+ femoral pulses bilaterally.  She has 1+ dorsalis pedis  pulse and has absent pedal pulses on the left foot.  She had bilateral  ABIs performed today which were 0.94 on the right and triphasic.  They  were 0.63 on the left and monophasic.   Although Wanda Yang has some complaints of claudication symptoms in  her right leg, her ABIs actually are fairly reasonable in the right leg.  I believe that some of her symptoms in the right leg are probably  multifactorial with musculoskeletal problems contributing as well as  arterial occlusive disease.  The left lower extremity actually has a  significantly lower ABI than the right but she does not complain of  claudication symptoms in this leg.  However, she is overall very  debilitated by her COPD and requires home oxygen.  I would not consider  any revascularization in her unless she had limb-threatening ischemia.  She is certainly not near that at this point.  I believe the best option  for her is continued medical management with risk factor modification.  She will continue to take her aspirin and Plavix.  She will follow up in  6 months' time for repeat ABIs.  She will also try to quit smoking.  Several minutes were devoted today to smoking cessation counseling.   Janetta Hora. Fields, MD  Electronically Signed   CEF/MEDQ  D:  12/10/2008  T:  12/11/2008  Job:  [id number]   cc:   [name], M.D.

## 2010-10-19 NOTE — Discharge Summary (Signed)
Wanda Yang, Wanda Yang              ACCOUNT NO.:  [id number]   MEDICAL RECORD NO.:  [id number]          PATIENT TYPE:  INP   LOCATION:  6522                         FACILITY:  [facility]   PHYSICIAN:  [name], M.D. DATE OF BIRTH:  Dec 08, 1940   DATE OF ADMISSION:  04/15/2008  DATE OF DISCHARGE:  04/17/2008                               DISCHARGE SUMMARY   DISCHARGE DIAGNOSES:  1. Coronary artery disease - status post percutaneous transluminal      coronary angioplasty and stenting of the mid-right coronary artery      and the first obtuse marginal artery.  2. Severe chronic obstructive pulmonary disease.  3. Presumed hyperlipidemia.  4. Hypertension.  5. Long history of cigarette smoking.   DISCHARGE MEDICATIONS:  1. Aspirin 81 mg a day.  2. Plavix 75 mg a day.  3. Nitroglycerin 0.4 mg sublingually as needed.  4. Crestor 20 mg a day.  5. Cardizem CD 240 mg a day.  6. Albuterol HFA as needed.  7. Effexor 75 mg a day.  8. Prednisone 10 mg a day or as otherwise directed by Dr. Ace Gins.  9. Pepcid AC as needed.  10.Hydrochlorothiazide 12.5 mg a day.  11.Coreg 6.25 mg twice a day.   DISPOSITION:  The patient will see Dr. Elease Hashimoto in 1-2 weeks.  She is to  see Dr. Larina Bras in a week or so for her wheezing.   Dr. Larina Bras will also handle the prednisone taper following this week.   HISTORY:  Wanda Yang is a 70 year old female with a long history of  COPD.  She was recently admitted with some chest and throat pain and was  found to have positive cardiac enzymes.  She was admitted for further  evaluation.  Please see dictated H&P for further details.   HOSPITAL COURSE:  1. Coronary artery disease.  The patient had positive cardiac enzymes.      She had a heart catheterization later that day.  She was found to      have an extremely tight right coronary artery, stenosis run off,      and severe left circumflex stenosis.  Her left anterior descending      artery had  minor luminal irregularities.  She underwent successful      PTCA and stenting of her right coronary artery using a 3.0 x 18-mm      Promus stent.  It was postdilated using a 3.25-mm balloon.   The circumflex artery was stented the following day.  We have replaced  another 3.0 x 18-mm stent postdilated using a 3.25-mm balloon.   The patient tolerated the procedure quite well.  She did not have any  significant EKG changes.  She will be followed by Dr. Elease Hashimoto and was  also enrolled in cardiac rehab.  1. Respiratory.  The patient had some wheezing.  During the admission,      we continued the prednisone and gave her albuterol.  She complained      of having some worsening shortness breath for which she took Advair  and the Spiriva.  We will have her see Dr. Larina Bras for further      management of this.      [name], M.D.  Electronically Signed     PJN/MEDQ  D:  04/17/2008  T:  04/17/2008  Job:  [id number]

## 2010-10-19 NOTE — Procedures (Signed)
VASCULAR LAB EXAM   INDICATION:  Leg claudication, history of stenting, evaluate for PAD.   HISTORY:  Diabetes:  No.  Cardiac:  Stenting.  Hypertension:  Yes.  Other:  Known left lower extremity PVD.   EXAM:  Left lower extremity arterial duplex.   IMPRESSION:  1. Biphasic Doppler waveforms noted in the left common femoral and      profunda femoral arteries and monophasic Doppler waveforms noted in      the popliteal and tibial arteries with no increase in velocities.  2. No flow could be detected in the left proximal to distal      superficial femoral artery, which is suggestive of a possible total      occlusion.  3. Preliminary report was faxed to Dr. Harvie Bridge office on May 20, 2008.     ___________________________________________  P. Liliane Bade, M.D.   CH/MEDQ  D:  05/20/2008  T:  05/20/2008  Job:  [id number]

## 2010-10-19 NOTE — H&P (Signed)
Wanda Yang, Wanda Yang              ACCOUNT NO.:  [id number]   MEDICAL RECORD NO.:  [id number]          PATIENT TYPE:  INP   LOCATION:  6522                         FACILITY:  [facility]   PHYSICIAN:  Wanda Friends. Dietrich Pates, MD, FACCDATE OF BIRTH:  [birth year]   DATE OF ADMISSION:  04/14/2008  DATE OF DISCHARGE:                              HISTORY & PHYSICAL   PRIMARY CARE PHYSICIAN:  Wanda Gins, MD   PRIMARY CARDIOLOGIST:  Wanda Mixer, MD   HISTORY OF PRESENT ILLNESS:  A 70 year old woman without known coronary  artery disease with a history of peripheral vascular disease presents  with a probable acute coronary syndrome.  Wanda Yang's vascular  history dates back to 2003 when right and left iliac stents were placed  for claudication.  She has done very well with respect to peripheral  vascular disease since that procedure.  She has severe COPD with 50-pack-  year history of cigarette smoking that continues and is chronically  short of breath with class III symptoms.  She has been followed by Dr.  Elease Yang for history of supraventricular tachycardia.  The exact nature of  her arrhythmia is uncertain.  She has been treated with diltiazem and  low-dose beta-blockers because of her history of bronchospasm with  uncertain control of her arrhythmia.  The night prior to admission, she  developed an episode of tachy palpitations associated with severe neck  pain that was not clearly related to neck position or movement.  There  was an associated increase in her baseline dyspnea.  She had no  diaphoresis or nausea.  Symptoms persisted for a matter of hours.  She  felt worse when she was supine, so she sat up in a chair most of the  night.  Eventually, she returned to her baseline state, but neck  discomfort recurred this evening prompting her husband to drive her to  the emergency department.  Initial EKG showed a normal sinus rhythm with  a right bundle-branch block, which is old and  nonspecific ST-T wave  abnormalities.  Initial cardiac markers were abnormal with a troponin of  0.21 and a CPK-MB of 14.  Symptoms have resolved spontaneously.  She  again feels back to baseline.  She had a minimal amount of associated  discomfort in the left midchest as well as some left arm discomfort, but  she has experienced similar left arm symptoms in the past without any of  the other aspects of her current symptom complex.   She has noted intermittent palpitations for years, but dates her current  arrhythmia to 6 months ago when treatment with beta-blockers was  stopped.  She developed increasing palpitations.  Initial medical  management by her primary care physician was unsuccessful prompting  referral to Wanda Yang.  An echocardiogram and pharmacologic stress  tests were reportedly negative.  Minor modifications remained in her  medical regime, but palpitations have continued.  She has also noted  increasing pedal edema.   PAST MEDICAL HISTORY:  Otherwise notable for a left arm fracture in 2003  requiring ORIF.  She has had some hyperlipidemia, but this  was not  thought to be severe enough to prompt pharmacologic therapy.  She had  colonic polyps excised via colonoscopy by Wanda Yang a few years ago.   SOCIAL HISTORY:  Worked inside the home; continues to smoke 1 pack of  cigarettes per day.  Married and lives locally; no history of excessive  alcohol use.   FAMILY HISTORY:  Positive for myocardial infarction in her father at the  age of 47.   REVIEW OF SYSTEMS:  Notable for intermittent blurred vision, which she  attributes to her hypertension and to her drugs for hypertension,  chronic changes in the skin over her nose resulting in bluish  discoloration.  All other systems reviewed and are negative.   ALLERGIES:  She reports an allergy to TETRACYCLINE and to INTRAVENOUS  CONTRAST (developed urticaria in the past).   CURRENT MEDICATIONS:  1. Aspirin 81 mg daily.  2.  Carvedilol 6.25 mg b.i.d.  3. Diltiazem 180 mg daily.  4. Effexor 75 mg daily.  5. Hydrochlorothiazide 12.5 mg daily.  6. Nexium 40 mg every other day.  7. Ventolin by MDI, which she uses rarely.   PHYSICAL EXAMINATION:  GENERAL:  On exam, pleasant woman in no acute  distress.  VITAL SIGNS:  The heart rate is 95 and regular, respirations 16 and  unlabored, temperature 97.9, blood pressure initially 195/96 and  subsequently 155/81, O2 saturation 96% on nasal oxygen.  HEENT:  Bluish discoloration over the anterior aspect of the nose; EOMs  full; normal lids and conjunctiva; normal oral mucosa.  NECK:  No jugular venous distention; normal carotid upstrokes without  bruits.  ENDOCRINE:  No thyromegaly.  HEMATOPOIETIC:  No adenopathy.  LUNGS:  Prolonged expiratory phase with expiratory wheezing.  CARDIAC:  Normal first and second heart sounds; fourth heart sound  present.  ABDOMEN:  Soft and nontender; liver edge at right costal margin; no  masses; no organomegaly; no bruits.  EXTREMITIES:  Pitting pretibial and ankle edema of 2+; distal pulses  intact.  NEUROLOGIC:  Symmetric strength and tone; normal cranial nerves.   LABORATORY DATA:  EKG:  Sinus tachycardia; borderline left atrial  abnormality; right bundle-branch block; right axis deviation;  nonspecific ST-T wave abnormality.  When compared to a prior tracing of  February 10, 2002, conduction abnormalities are unchanged; ST-T waves  were normal on the prior tracing.   Laboratory otherwise notable for a white count of 15,000 with normal  hemoglobin and elevated polys.  Chemistry profile shows mildly elevated  BUN and creatinine of 29 and 1.4 respectively.  Glucose is 133.  CPK 79  with a CPK-MB of 7.7 and a troponin of 0.41.   Chest x-ray:  Normal heart size; increased interstitial markings;  emphysematous changes.  No acute abnormalities.   IMPRESSION:  [name] presents with symptoms that are far from  classic, but  certainly worrisome for an acute ischemic syndrome.  She  has changed EKG, but no diagnostic findings for ischemia.  She has  abnormal cardiac markers, but this minor degree of elevation could be  caused by chronic obstructive pulmonary disease and very mild renal  insufficiency, perhaps with some superimposed congestive heart failure.  Initial treatment will be with aspirin, clopidogrel, intravenous  heparin, and intravenous nitroglycerin.  Her low-dose beta-blocker will  be discontinued due to bronchospasm, which will be treated with  nebulizer therapy.  She needs to discontinue cigarettes.  Nicotine  replacement will be offered.  Prednisone will be given in light of her  history of contrast allergy  in preparation for possible coronary angiography.  She also received  intramuscular dexamethasone from her primary care physician within the  past day or two.  She will be kept n.p.o. and hydrated.  Wanda Yang will  render the final decision regarding cardiac catheterization when he sees  her in the morning.      Wanda Friends. Dietrich Pates, MD, [facility]  Electronically Signed     RMR/MEDQ  D:  04/15/2008  T:  04/15/2008  Job:  [id number]

## 2010-10-19 NOTE — Assessment & Plan Note (Signed)
OFFICE VISIT   Wanda Yang, Wanda Yang  DOB:  07-30-1940                                       08/15/2008  [id number]#:[id number]   Wanda Yang presents today with CT of her aorta to rule out  aneurysmal pathology.  I have been asked to see her cardiac cath films  by Dr. Kristeen Miss due to irregular flow in her infrarenal aorta.  It  appeared that she did have some evidence of mural thrombus, and it was  impossible to rule out an aneurysm based on the single injection at the  time of her cardiac catheterization.  I had recommended that she undergo  CT scan, and I am seeing her today for evaluation of this.  She had seen  Dr. Fabienne Bruns in our office with an extensive prior workup for her  lower extremity arterial insufficiency on June 11, 2008.  She is now  on home oxygen with worsening COPD and emphysema.  She denies  claudication-type symptoms and does not have any prior history of  aneurysm.   PHYSICAL EXAMINATION:  Well-developed white female appearing stated age  of 75.  She does have shortness of breath and is on chronic oxygen  therapy.  ABDOMEN:  Exam reveals moderate obesity.  She does have palpable femoral pulses bilaterally and actually does have  palpable right dorsalis pedis pulse.   I reviewed her CAT scan with her.  This does show extensive  atherosclerotic changes of her aorta but does not show any evidence of  aneurysm.  She has a lot of calcification.  I discussed this with Wanda Yang and her husband present.  She had been scheduled to have a  followup with Dr. Darrick Penna, and we will have her see him again for  continued evaluation of her arterial insufficiency with Dr. Darrick Penna in 3  months.   Wanda Yang, M.D.  Electronically Signed   TFE/MEDQ  D:  08/15/2008  T:  08/18/2008  Job:  [id number]   cc:   [name], M.D.

## 2010-10-19 NOTE — Assessment & Plan Note (Signed)
OFFICE VISIT   KINGA, CASSAR  DOB:  May 10, 1941                                       06/11/2008  ZOXWR#:[id number]   Ms. Jeane is a 70 year old female referred by Dr. Elease Hashimoto for  evaluation of lower extremity claudication symptoms.  Her primary  atherosclerotic risk factors include coronary artery disease,  hypertension and elevated cholesterol.  She is a former smoker, but quit  in November 2009.  She has also had bilateral iliac stents placed in  2004.  Her primary complaints are of pain and swelling in both legs,  left greater than right.  She also has some burning in her toes at  nighttime.  She also complains of right thigh pain with walking.  She  states that these symptoms have been going on for several years.   PAST MEDICAL HISTORY:  1. Coronary stenting on two prior occasions.  2. She also has COPD.  3. Otherwise, history is as mentioned above.   FAMILY HISTORY:  Her father had coronary artery disease at an age less  than 74.   SOCIAL HISTORY:  She is married and has two children.  Smoking history  as listed above.  She does not consume alcohol regularly.   REVIEW OF SYSTEMS:  CONSTITUTIONAL:  She is 5 feet 8 and 204 pounds.  She has had some recent weight gain.  CARDIAC:  She has shortness of  breath with exertion and a cardiac murmur.  PULMONARY:  She has occasional wheezing.  GI:  She has reflux.  ORTHOPEDIC:  She has multiple joint arthritis.  PSYCHIATRIC:  She has some anxiety.  HEMATOLOGIC:  She has had a history of chronic anemia.   ENT, neurologic and renal review of systems are all negative.   MEDICATIONS:  1. Effexor caps 75 mg once a day.  2. Nexium.  3. Esomeprazole 40 mg every other day.  4. Hydrochlorothiazide 12.5 mg once a day.  5. Aspirin 81 mg once a day.  6. Ventolin inhaler p.r.n.  7. Advair 250/50 twice a day.  8. Diltiazem 240 mg once a day.   ALLERGIES:  SHE HAS HAD A REACTION TO CONTRAST MEDIA IN THE  PAST, WHICH  CAUSED HER TO HAVE HIVES.  THIS OCCURRED WITH HER ILIAC STENTING.  SHE  ALSO DEVELOPS HIVES WITH TETRACYCLINE.   PHYSICAL EXAMINATION:  VITAL SIGNS:  Blood pressure is 144/79 in the  right arm.  Pulse is 79 and regular.  HEENT:  Unremarkable.  NECK:  Has 2+ carotid pulses without bruit.  CHEST:  Clear to auscultation.  CARDIAC:  Regular rate and rhythm without murmur.  ABDOMEN:  Soft, nontender, nondistended with no masses.  EXTREMITIES:  She has 2+ femoral pulses bilaterally.  She has absent  popliteal and pedal pulses bilaterally.  She has trace edema in both  lower extremities.  She has a few scattered varicosities in the calf in  both legs.  Upper extremities, she has 2+ brachial and radial pulse in  the right arm.  She has absent brachial and radial pulse in the left  arm.   She has no ulcerations on the feet.   She had arterial duplex with ABIs performed on May 20, 2008.  This  showed an ABI on the right side of 1.04 with triphasic Doppler signals.  On the left side, the ABI was  0.62 with biphasic signal.  Duplex  ultrasound also showed occlusion of her left proximal to distal  superficial femoral artery.   In summary, Ms. Markwell primarily complains of some swelling and  burning sensation in both feet.  She actually does not classically  describe claudication symptoms in her left lower extremity.  It is  difficult to determine how disabled she is from her claudication since  she also has dyspnea with minimal amounts of exertion.  I believe the  best option for her initially will be conservative management.  I have  started on walking program of 30 minutes daily.  Also risk factor  modification will hopefully improve her lower extremity symptoms  somewhat.  I did discuss with her the possibility of intervention at  some point in the future.  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  I do not know that her symptoms are  debilitating enough or that her overall condition would be good enough  for an elective femoral popliteal bypass for claudication symptoms  alone.  We will see how she does with her walking program and she will  return for follow-up in 3 months' time.  She had ABIs with exercise at  that time.   Janetta Hora. Fields, MD  Electronically Signed   CEF/MEDQ  D:  06/11/2008  T:  06/12/2008  Job:  [id number]   cc:   Ace Gins, MD  [name], M.D.

## 2010-10-19 NOTE — H&P (Signed)
NAME:  MIKE, BERNTSEN NO.:  [id number]   MEDICAL RECORD NO.:  [id number]           PATIENT TYPE:   LOCATION:                                 FACILITY:   PHYSICIAN:  [name], M.D. DATE OF BIRTH:  07-25-1940   DATE OF ADMISSION:  DATE OF DISCHARGE:                              HISTORY & PHYSICAL   Taylor Spilde is a 70 year old female with a history of end-stage COPD  and coronary artery disease.  She is admitted to the hospital now with  episodes of increased shortness of breath and chest pain for a heart  catheterization.   Ms. Holzheimer is a 70 year old female with a history of COPD.  She also  has a history of peripheral vascular disease.  She has a history of  coronary artery disease and we have performed PTCA and stenting of her  right coronary artery and her circumflex artery.   Her right coronary artery has a 3.0 x 18 mm Promus positioned stent.  It  was post dilated using a 3.25 x 15 mm Voyager [place].   The circumflex stent is also a 3.0 x 18 mm XIENCE stent.  It was  postdilated using a 3.25 x 15-mm Quantum Maverick up to 14 atmospheres.   The circumflex did have a 50% stenosis in the distal aspect of the  vessel.  The stenosis remained unchanged and we did not treat that.   She initially did fairly well, but over the past week or so has  developed severe shortness of breath.   It should be noted that at the time of her initial heart  catheterization, the circumflex stenosis was about 85-90%.  The right  coronary artery had 95% stenosis mid vessel.   The patient denies any syncope or presyncope.  She denies any cough or  sputum production.  She denies any wheezing.  She quit smoking in  November 2009.   CURRENT MEDICATIONS:  1. Effexor 75 mg a day.  2. Hydrochlorothiazide 12.5 mg a day.  3. Aspirin 81 mg a day.  4. Carvedilol 12.5 mg twice a day.  5. Diltiazem slow release 240 mg a day.  6. Plavix 75 mg a day.  7. Crestor 20 mg a  day.  8. Vitamin C.  9. Vitamin D once a day.  10.Omega-3 fatty acids once a day.   She is allergic to TETRACYCLINE.  IV DYE causes a rash.  She tolerated  her heart catheterizations without any incident.   PAST MEDICAL HISTORY:  1. Coronary artery disease.  She is status post PTCA and stenting of      her left circumflex artery and a right coronary artery.  2. Severe end-stage COPD.  3. Peripheral vascular disease.  She is status post iliac stenting.  4. Hypertension.   SOCIAL HISTORY:  The patient quit smoking in November 2009.   FAMILY HISTORY:  Negative for cardiac disease.   REVIEW OF SYSTEMS:  As noted in the HPI.  All other systems were  reviewed and are negative.   PHYSICAL EXAMINATION:  GENERAL:  She is a middle-aged  female, who  appears to be somewhat older than her stated age.  She is alert and  oriented x3.  Mood and affect are normal.  VITAL SIGNS:  Her weight is 207, blood pressure is 146/84, heart rate of  88.  HEENT:  2+ carotids.  There is no bruits, no JVD, no thyromegaly.  She  is atraumatic and normocephalic.  NECK:  Supple.  LUNGS:  Clear.  BACK:  Nontender.  HEART:  Regular rate, S1 and S2.  There is no chest wall tenderness.  PMI is nondisplaced.  ABDOMEN:  Good bowel sounds.  There is no guarding or rebound.  There is  no hepatosplenomegaly.  SKIN:  Warm and dry.  EXTREMITIES:  She has no clubbing, cyanosis, or edema.  She has 1+  pulses.  NEUROLOGIC:  Cranial nerves II through XII are intact.  Her motor and  sensory function intact.  Her gait is normal.   Ms. Verdejo presents with some severe shortness of breath and chest  pain.  I worry that she may have restenosis of one of her stents.  We  have decided to proceed with heart catheterization for further  evaluation.  We have discussed the risks, benefits, and options of heart  catheterization.  She understands and agrees to proceed.      [name], M.D.  Electronically  Signed     PJN/MEDQ  D:  07/23/2008  T:  07/24/2008  Job:  [id number]   cc:   Marjory Lies, M.D.

## 2010-10-19 NOTE — Cardiovascular Report (Signed)
NAMEMarland Kitchen  CHUNDRA, SAUERWEIN NO.:  [id number]   MEDICAL RECORD NO.:  [id number]          PATIENT TYPE:  INP   LOCATION:  6522                         FACILITY:  [facility]   PHYSICIAN:  [name], M.D. DATE OF BIRTH:  06/23/1940   DATE OF PROCEDURE:  04/16/2008  DATE OF DISCHARGE:                            CARDIAC CATHETERIZATION   Wanda Yang is a 70 year old female with a history of severe COPD.  She has coronary artery disease.  She underwent PTCA and stenting of her  right coronary artery yesterday.  She is brought back today for the  second of the stage procedure for PCI of her left circumflex artery.   The procedure was PTCA and stenting of the left circumflex artery.   The right femoral artery was easily cannulated using modified Seldinger  technique.   Angiomax was given.  ACT was 351.   The left main was engaged using a 3.5 CLS catheter.  Angiography  revealed an 80-90% eccentric left circumflex stenosis just prior to the  bifurcation of the large obtuse marginal branch.  There is a 50%  stenosis in the distal aspect of the circumflex vessel.   A 3.0 x 15 mm apex was positioned across the stenosis.  It was inflated  up to 6 atmospheres for 30 seconds.  This resulted in some improvement  of the lumen, but with a definite dissection plane.  At this point, a  Promus 3.0 x 18 mm stent was positioned across the entire lesion  including the dissection flap.  It was deployed at 12 atmospheres for 20  seconds.   Post-stent dilatation was achieved using a 3.25 x 15 mm Quantum  [place].  It was positioned distally and the stent was inflated up to  14 atmospheres for 30 seconds.  It was then pulled back proximally and  then inflated up to 14 atmospheres for 30 seconds.  This gave [place] a very  nice angiographic result.  The remaining of the circumflex artery looked  good at the bifurcation.  The 50% stenosis in the distal more aspect of  the circumflex artery  remained unchanged and we decided not to  angioplasty that.  We will treat this lesion medically.   COMPLICATIONS:  None.   CONCLUSION:  Successful PTCA and stenting of the left circumflex artery.      [name], M.D.  Electronically Signed     PJN/MEDQ  D:  04/16/2008  T:  04/16/2008  Job:  [id number]   cc:   Ursula Beath, MD

## 2010-10-19 NOTE — Op Note (Signed)
Wanda Yang, Wanda Yang              ACCOUNT NO.:  [id number]   MEDICAL RECORD NO.:  [id number]          PATIENT TYPE:  AMB   LOCATION:  ENDO                         FACILITY:  [facility]   PHYSICIAN:  Anselmo Rod, M.D.  DATE OF BIRTH:  February 16, 1941   DATE OF PROCEDURE:  12/20/2006  DATE OF DISCHARGE:                               OPERATIVE REPORT   PROCEDURE PERFORMED:  Esophagogastroduodenoscopy with small-bowel  biopsies.   ENDOSCOPIST:  Anselmo Rod, M.D.   INSTRUMENT USED:  Pentax video panendoscope.   INDICATIONS FOR PROCEDURE:  A 70 year old white female with a history of  blood in her stool, reflux and severe gas and bloating undergoing an  EGD.  The patient had a history of anemia, rule out peptic ulcer  disease, sprue etc.   PREPROCEDURE PREPARATION:  Informed consent was procured from the  patient. The patient was fasted for 8 hours prior to the procedure. The  risks and benefits of the procedure were discussed with the patient in  detail.   PREPROCEDURE PHYSICAL:  VITAL SIGNS:  The patient had stable vital  signs.  NECK:  Supple.  CHEST:  Clear to auscultation.  S1 and S2 regular.  ABDOMEN:  Soft with normal bowel sounds.   DESCRIPTION OF PROCEDURE:  The patient was placed in the left lateral  decubitus position and sedated with 50 mcg of Fentanyl and 5 mg of  Versed given intravenously in slow incremental doses. Once the patient  was adequately sedated and maintained on low-flow oxygen and continuous  cardiac monitoring, the Pentax video panendoscope was advanced through  the mouthpiece over the tongue, into the esophagus under direct vision.  The entire esophagus was widely patent, with no evidence of ring,  stricture, masses, esophagitis or Barrett's mucosa. The scope was then  advanced into the stomach.  Moderate antral gastritis was noted; the  rest of the gastric mucosa appeared healthy. There was no evidence of a  hiatal hernia on high retroflexion. The  proximal small bowel appeared  normal. Small-bowel biopsies were done to rule out sprue. There was no  outlet obstruction.  The patient tolerated the procedure well without  immediate complications.   IMPRESSION:  1. Normal-appearing esophagus.  2. Moderate antral gastritis.  3. Normal proximal small bowel.  4. Small bowel biopsies done to rule out sprue.   RECOMMENDATIONS:  1. Await pathology results.  2. Avoid all nonsteroidals including aspirin for the next 2 weeks.  3. Proceed with a colonoscopy at this time.  Further recommendations      are made thereafter.      Anselmo Rod, M.D.  Electronically Signed     JNM/MEDQ  D:  12/20/2006  T:  12/20/2006  Job:  [id number]   cc:   Ace Gins, MD

## 2010-10-19 NOTE — Cardiovascular Report (Signed)
NAME:  [name], [name] NO.:  [id number]   MEDICAL RECORD NO.:  [id number]           PATIENT TYPE:   LOCATION:                                 FACILITY:   PHYSICIAN:  [name], M.D. DATE OF BIRTH:  Aug 05, 1940   DATE OF PROCEDURE:  04/15/2008  DATE OF DISCHARGE:                            CARDIAC CATHETERIZATION   Wanda Yang is a 70 year old female with a history of end-stage COPD.  She presented to the hospital with episodes of chest pressure and  palpitations last night.  She had minimally positive cardiac enzymes  consistent with an acute coronary syndrome.  She was set up for cath  this afternoon.   The procedure was left heart catheterization with coronary angiography  with PTCA and stenting of the mid right coronary artery.   The right femoral artery was easily cannulated using modified Seldinger  technique.   HEMODYNAMICS:  LV pressure is 153/12 with an aortic pressure of 154/70.   Angiography:  Left main:  The left main is fairly smooth and normal.   The left anterior descending artery has mild irregularities.  The first  diagonal artery has mild irregularities.   The left circumflex artery is a fairly large vessel.  There is an 85-90%  stenosis just prior to giving off the first obtuse marginal artery.  There is a 50% stenosis in the distal circumflex, just after this 90%  lesion.   The first obtuse marginal artery is a moderate-sized vessel and has  minor luminal irregularities.   The right coronary artery is a large vessel.  This is dominant.  There  is a 95% stenosis in the mid vessel.  The posterior descending artery is  unremarkable.   The left ventriculogram was performed in 30 RAO position.  It reveals  mild inferobasilar hypokinesis.  The overall ejection fraction is normal  with an EF of 60%.   PCI:  The right coronary artery was engaged using a 6-French Judkins  right 4 guide.  Angiomax was given.  ACT was 322.  A  [place] guidewire  was placed down into the distal right coronary artery.  Predilatation  was achieved using a 3.0 x 15 mm Apex.  We inflated it up to 8  atmospheres for 35 seconds.  This gave [place] a little bit better opening  but with a clear-cut dissection.  There was some degree of dissection  needed before the case.   At this point, a 3.0 x 18 mm Promus stent was positioned across the  stenosis.  It was deployed at 12 atmospheres for 19 seconds.  Post stent  dilatation was achieved using a 3.25 x 15 mm Voyager [place].  It was  positioned distally and the stent balloon was inflated up to 14  atmospheres for 20 seconds.  It was then pulled back to the proximal  edge of the stent and was inflated up to 16 atmospheres for 14 seconds.  This gave [place] a very nice angiographic result.   COMPLICATIONS:  None.   CONCLUSION:  Successful PTCA and stenting of the mid right coronary  artery.  We will schedule the second of the staged procedure for the PCI  of the circumflex artery tomorrow.   TOTAL CONTRAST USED:  Approximately 210 mL.      [name], M.D.  Electronically Signed     PJN/MEDQ  D:  04/15/2008  T:  04/16/2008  Job:  [id number]

## 2010-10-19 NOTE — Op Note (Signed)
Wanda Yang, Wanda Yang              ACCOUNT NO.:  [id number]   MEDICAL RECORD NO.:  [id number]          PATIENT TYPE:  AMB   LOCATION:  ENDO                         FACILITY:  [facility]   PHYSICIAN:  Anselmo Rod, M.D.  DATE OF BIRTH:  1941-05-13   DATE OF PROCEDURE:  12/20/2006  DATE OF DISCHARGE:                               OPERATIVE REPORT   PROCEDURE PERFORMED:  Colonoscopy with snare polypectomy x 5.   ENDOSCOPIST:  Anselmo Rod, M.D.   INSTRUMENT USED:  Pentax video colonoscope.   INDICATIONS FOR PROCEDURE:  A 70 year old white female with a history of  rectal bleeding and a personal history of colon polyps removed in the  past  undergoing a repeat colonoscopy for CRC screening to rule out  colonic polyps, masses, etc.  Patient has a history of hemorrhoids.   PRE-PROCEDURE PREPARATION:  Informed consent was procured from the  patient. The patient fasted for eight hours prior to the procedure and  prepped with a bottle of magnesium citrate and a gallon of NuLyte the  night prior to the procedure.  She also took two tablets of Dulcolax  prior to magnesium citrate.  Risks and benefits of the procedure,  including a 10% missed rate of cancer and polyps, were discussed with  the patient as well. The patient received 1 gm of Ancef for MVP  prophylaxis prior to the procedure.   PRE-PROCEDURE PHYSICAL EXAMINATION:  VITAL SIGNS:  Stable vital signs.  NECK:  Supple.  CHEST:  Clear to auscultation.  HEART:  S1 and S2.  Regular.  ABDOMEN:  Soft with normal bowel sounds.   DESCRIPTION OF PROCEDURE:  The patient was placed in the left lateral  decubitus position and sedated with an additional 100 mcg of Fentanyl  and 8 mg of Versed given intravenously in slow, incremental doses.  Once  the patient was adequately sedated, maintained on low flow oxygen, and  continuous cardiac monitoring, the Pentax video colonoscope was advanced  from the rectum to the cecum.  Multiple washings  were done. Small  internal hemorrhoids were seen on retroflexion, and small external  hemorrhoids are seen on anal inspection. Two small sessile polyps are  removed by a cold snare from the rectosigmoid colon.  Two polyps were  removed from the left colon at 90 and 40 cm, respectively, by cold snare  as well.  These are small sessile polyps.  One small sessile polyp was  removed via cold snare from the cecum.  The appendicle orifices and  ileocecal valve were clearly visualized and photographed. The terminal  ileum appeared healthy and without lesions. There was no evidence of  diverticulosis.  The patient tolerated the procedure well without  immediate complications.   IMPRESSION:  1. Small internal and external hemorrhoids.  2. Multiple polyps snared from the left colon and rectosigmoid colon      (cold snare x4).  3. Another small sessile polyp removed by cold snare from the cecum.  4. Normal terminal ileum.   RECOMMENDATIONS:  1. Await pathology results.  2. Avoid all nonsteroidals, including aspirin for the next  two weeks.  3. Repeat colonoscopy depending on pathology results.  4. Outpatient followup in the next two weeks.      Anselmo Rod, M.D.  Electronically Signed     JNM/MEDQ  D:  12/20/2006  T:  12/20/2006  Job:  [id number]   cc:   Ace Gins, MD

## 2010-10-19 NOTE — Cardiovascular Report (Signed)
NAME:  Wanda Yang, Wanda Yang NO.:  [id number]   MEDICAL RECORD NO.:  [id number]          PATIENT TYPE:  OIB   LOCATION:  1961                         FACILITY:  [facility]   PHYSICIAN:  [name], M.D. DATE OF BIRTH:  Dec 05, 1940   DATE OF PROCEDURE:  DATE OF DISCHARGE:  07/25/2008                            CARDIAC CATHETERIZATION   Wanda Yang is a 70 year old female with a history of severe COPD.  She has a history of coronary artery disease and is status post stenting  of her left circumflex artery and right coronary artery.  She also has a  history of peripheral vascular disease and is status post stenting of  her iliac artery.   She presents with a progressive shortness of breath especially with  exertion.  She is referred for heart catheterization for further  evaluation.   PROCEDURE:  Left heart catheterization with coronary angiography.   HEMODYNAMICS:  The left ventricular pressure was 141/24.  Aortic  pressure was 141/60.   ANGIOGRAPHY:  Left main.  The left main is fairly short and fairly  normal.  There is almost 2 separate ostia for the LAD and circumflex  artery.   The left anterior descending artery has mild luminal irregularities  proximally.  There is a mid 30-40% stenosis.  There is diffuse mild  disease throughout the course of the LAD.  The distal LAD has mild  irregularities.   The first diagonal artery is quite small, but is unremarkable.  The  second diagonal artery is fairly large sized and has minimal  irregularities.   The left circumflex artery is a large vessel.  The proximal stent is  completely normal.  There is no evidence of in-stent restenosis.  The  obtuse marginal artery is normal.   The distal circumflex artery has a moderate 40% stenosis, but it is not  obstructive.   The right coronary artery is large and dominant.  There is a stent in  the mid vessel.  The stent is completely normal.  There is no edge  dissection.  There is minor to moderate irregularities in the proximal  distal segment.  The posterior descending artery has minor luminal  irregularities.  The posterolateral segment artery is normal.   Left ventriculogram was performed in the 30 RAO position.  It reveals  overall normal/hyperdynamic left ventricular systolic function.  Ejection fraction at least 70-75%.  There are no segmental wall motion  abnormalities.   An aortogram was performed because of some difficulty in getting up to  the central circulation.  It reveals an ulcerated plaque/abdominal  aortic aneurysm just below the renal arteries.  This ulceration is  somewhat ill-defined, but certainly appears to be aneurysmal.  There is  moderate disease in the iliac arteries.  The stent is patent.   COMPLICATIONS:  None.   CONCLUSION:  1. Coronary artery disease, but with patent stents in the left      circumflex artery and right coronary artery.  2. Normal/hyperdynamic left ventricular systolic function.  She may      have some degree of diastolic dysfunction.  She also  has mildly      elevated end-diastolic pressures which we will need to treat.   She has an abdominal aortic aneurysm or at least an ulcerated plaque in  her abdominal aorta.  We have discussed the case with Dr. Gretta Began.  We will get a CT angiogram the next week or so and we will refer the  patient to Dr. Arbie Cookey for further evaluation.  The patient is stable,  leaving the lab.      [name], M.D.  Electronically Signed     PJN/MEDQ  D:  07/25/2008  T:  07/25/2008  Job:  [id number]   cc:   Larina Earthly, M.D.  Marjory Lies, M.D.

## 2010-10-22 NOTE — Op Note (Signed)
Wanda Yang, Wanda Yang                       ACCOUNT NO.:  [id number]   MEDICAL RECORD NO.:  [id number]                   PATIENT TYPE:  OBV   LOCATION:  0445                                 FACILITY:  [facility]   PHYSICIAN:  Almedia Balls. Ranell Patrick, M.D.              DATE OF BIRTH:  February 10, 1941   DATE OF PROCEDURE:  DATE OF DISCHARGE:                                 OPERATIVE REPORT   PREOPERATIVE DIAGNOSIS:  Left wrist fracture, displaced intra-articular.   POSTOPERATIVE DIAGNOSIS:  Left wrist fracture, displaced intra-articular.   PROCEDURE:  Open reduction internal fixation of left distal radius fracture  utilizing hand innovations plate system.   SURGEON:  Almedia Balls. Ranell Patrick, M.D.   ASSISTANT:  Clarene Reamer, P.A.-C.   ANESTHESIA:  General anesthesia.   ESTIMATED BLOOD LOSS:  Minimal.   FLUID REPLACEMENT:  1200 cc crystalloid.   TOURNIQUET TIME:  1 hour and 10 minutes.   COUNTS:  Instrument counts were correct.   COMPLICATIONS:  There were no complications.   ANTIBIOTICS:  No antibiotics were given.   INDICATIONS:  The patient is a 70 year old female who fell onto an  outstretched left upper extremity and sustained an injury to the left wrist.  The patient presented to the emergency room with a deformed painful wrist.  X-rays demonstrated a comminuted displaced intra-articular distal radius  fracture.  The patient had a closed reduction under hematoma block which  initially had a successful alignment.  The patient presented one week later  to the emergency clinic, and there was noted to be loss of alignment.  The  patient was referred to Dr. Ranell Patrick' service for evaluation and possible  surgical treatment.  After discussing the patient and husband concerns over  loss of dorsal height and volar tilt and the possible long-term  complications, namely arthritis and instability, surgery is recommended.  The patient and her husband concurred.  Informed consent was obtained.   DESCRIPTION OF PROCEDURE:  After an adequate level of anesthesia was  achieved, and  g of Ancef was preoperatively, the patient was positioned  supine on the operating table.  A nonsterile tourniquet on the left proximal  arm.  The left arm was then prepped and draped in the usual sterile fashion.  After elevation and exsanguination of the arm using an Esmarch bandage, a  longitudinal incision was created overlying the flexor carpi radialis tendon  near the wrist.  Dissection was carried down through the subcutaneous  tissues using tenotomy scissors.  Loupe magnification was used.  The FCR was  identified, its sheath opened.  The tendon retracted medially and then the  floor of the sheath incised utilizing scissors.  The pronator quadratus was  identified and incised sharply using the Bovie electrocautery down to the  distal radius.  The fracture site was encountered.  Subperiosteal dissection  of the distal radial fracture was done.  There was noted to be a comminuted  fracture which was displaced.  This was reduced using manual traction and a  reduction maneuver.  C-arm fluoroscopy was used to demonstrate acceptable  induction.  Next, a hand innovations distal radial plate was applied to the  volar radius and attached utilizing bicortical screws for the radial shaft.  A combination of threaded locking screws and threaded locking pins were  utilized to fix the distal fragment.  There was noted to be a small bipunch  portion of the lunate fossa of the distal radius which was nearly  anatomically reduced but was so small that it could not hold a screw.  At  this point, the arm was fluoroscoped with multiple planes and demonstrated  appropriate fracture reduction.  Once this was confirmed, the wound was  thoroughly irrigated.  The pronator quadratus was repaired using 2-0 Vicryl,  subcu closure with 2-0 Vicryl, and 4-0 Monocryl for the skin.  Steri-Strips  were applied followed by a sterile  dressing and an above elbow splint.  The  patient was taken to the post anesthesia care unit in stable condition after  surgery.                                               Almedia Balls. Ranell Patrick, M.D.    SRN/MEDQ  D:  02/10/2002  T:  02/10/2002  Job:  [id number]

## 2010-11-09 ENCOUNTER — Telehealth: Payer: Self-pay | Admitting: Cardiovascular Disease

## 2010-11-09 NOTE — Telephone Encounter (Signed)
Has to have 2 teeth pulled on June 11. Does she need to come off of her plavix and asprin? Just leave message on machine she has another md appointment today.

## 2010-11-09 NOTE — Telephone Encounter (Signed)
Per dr Elease Hashimoto, only go off if dentist advises to,which she said he did. She was told to hold plavix 5 days and to continue asa. Pt verbalized understanding not to stop asa.Alfonso Ramus RN

## 2011-01-12 ENCOUNTER — Ambulatory Visit: Payer: Federal, State, Local not specified - PPO | Admitting: Pulmonary Disease

## 2011-02-03 ENCOUNTER — Ambulatory Visit (INDEPENDENT_AMBULATORY_CARE_PROVIDER_SITE_OTHER): Payer: Federal, State, Local not specified - PPO | Admitting: Pulmonary Disease

## 2011-02-03 ENCOUNTER — Encounter: Payer: Self-pay | Admitting: Pulmonary Disease

## 2011-02-03 VITALS — BP 152/74 | HR 85 | Temp 98.2°F | Ht 67.0 in | Wt 198.8 lb

## 2011-02-03 DIAGNOSIS — J449 Chronic obstructive pulmonary disease, unspecified: Secondary | ICD-10-CM

## 2011-02-03 DIAGNOSIS — Z23 Encounter for immunization: Secondary | ICD-10-CM

## 2011-02-03 MED ORDER — ARFORMOTEROL TARTRATE 15 MCG/2ML IN NEBU: 15.0000 ug | INHALATION_SOLUTION | Freq: Two times a day (BID) | RESPIRATORY_TRACT | Status: DC

## 2011-02-03 MED ORDER — BUDESONIDE 0.5 MG/2ML IN SUSP: 0.5000 mg | Freq: Two times a day (BID) | RESPIRATORY_TRACT | Status: DC

## 2011-02-03 NOTE — Assessment & Plan Note (Signed)
The patient has known severe emphysema with chronic respiratory failure, and unfortunately continues to smoke.  She really has no intention of quitting, and is totally noncompliant with her bronchodilator regimen.  It'll be she is wearing her oxygen consistently.  I have asked her to take her neb treatments b.i.d. As prescribed, to stop smoking, and to work on getting some form of exercise.

## 2011-02-03 NOTE — Progress Notes (Signed)
  Subjective:    Patient ID: Wanda Yang, female    DOB: 05/03/1941, 70 y.o.   MRN: [id number]  HPI The patient comes in today for followup of her known severe emphysema with chronic respiratory failure.  She continues to be noncompliant with her inhaler regimen, and also continues to smoke.  She is basically told me that she has no intention of quitting.  She denies any acute exacerbation recently, or a pulmonary infection.  She continues on her oxygen.   Review of Systems  Constitutional: Negative for fever and unexpected weight change.  HENT: Negative for ear pain, nosebleeds, congestion, sore throat, rhinorrhea, sneezing, trouble swallowing, dental problem, postnasal drip and sinus pressure.   Eyes: Positive for redness. Negative for itching.  Respiratory: Positive for cough. Negative for chest tightness, shortness of breath and wheezing.   Cardiovascular: Positive for leg swelling. Negative for palpitations.  Gastrointestinal: Positive for nausea. Negative for vomiting.  Genitourinary: Negative for dysuria.  Musculoskeletal: Positive for joint swelling.  Skin: Negative for rash.  Neurological: Positive for headaches.  Hematological: Bruises/bleeds easily.  Psychiatric/Behavioral: Negative for dysphoric mood. The patient is not nervous/anxious.        Objective:   Physical Exam Overweight female in no acute distress Nose is patent without discharge or obvious purulent. Chest with decreased breath sounds throughout, no wheezes or rhonchi Cardiac exam with regular rate and rhythm, 2/6 systolic murmur Lower extremities with 1+ edema bilaterally, no cyanosis Alert and oriented, moves all 4 extremities.       Assessment & Plan:

## 2011-02-03 NOTE — Patient Instructions (Signed)
Will give flu shot today Take nebulizer treatments twice a day Stop smoking followup with me in 6mos

## 2011-02-03 NOTE — Progress Notes (Signed)
Addended by: Salli Quarry on: 02/03/2011 12:48 PM   Modules accepted: Orders

## 2011-03-08 LAB — CBC
HCT: 31.8 — ABNORMAL LOW
HCT: 35.4 — ABNORMAL LOW
Hemoglobin: 11.9 — ABNORMAL LOW
Hemoglobin: 13.7
MCHC: 32.5
MCHC: 33.5
MCHC: 33.9
MCV: 89.7
MCV: 91.9
Platelets: 217
Platelets: 249
RBC: 3.12 — ABNORMAL LOW
RBC: 3.95
RBC: 4.47
RDW: 16.4 — ABNORMAL HIGH
WBC: 15.1 — ABNORMAL HIGH
WBC: 18.3 — ABNORMAL HIGH

## 2011-03-08 LAB — COMPREHENSIVE METABOLIC PANEL
AST: 31
Albumin: 2.9 — ABNORMAL LOW
Alkaline Phosphatase: 95
CO2: 27
Chloride: 100
Creatinine, Ser: 1.43 — ABNORMAL HIGH
GFR calc Af Amer: 44 — ABNORMAL LOW
GFR calc non Af Amer: 37 — ABNORMAL LOW
Potassium: 4.3
Total Bilirubin: 0.5

## 2011-03-08 LAB — DIFFERENTIAL
Basophils Absolute: 0
Basophils Relative: 0
Lymphocytes Relative: 5 — ABNORMAL LOW
Monocytes Absolute: 0.5
Neutro Abs: 13.9 — ABNORMAL HIGH
Neutrophils Relative %: 92 — ABNORMAL HIGH

## 2011-03-08 LAB — BASIC METABOLIC PANEL
BUN: 27 — ABNORMAL HIGH
BUN: 28 — ABNORMAL HIGH
BUN: 43 — ABNORMAL HIGH
CO2: 24
CO2: 25
Calcium: 8.7
Chloride: 106
Creatinine, Ser: 1.32 — ABNORMAL HIGH
Creatinine, Ser: 1.41 — ABNORMAL HIGH
Creatinine, Ser: 1.53 — ABNORMAL HIGH
GFR calc Af Amer: 45 — ABNORMAL LOW
GFR calc non Af Amer: 40 — ABNORMAL LOW
Glucose, Bld: 129 — ABNORMAL HIGH
Potassium: 3.9

## 2011-03-08 LAB — POCT I-STAT, CHEM 8
BUN: 29 — ABNORMAL HIGH
Chloride: 102
Creatinine, Ser: 1.4 — ABNORMAL HIGH
Glucose, Bld: 133 — ABNORMAL HIGH
Hemoglobin: 14.3
Potassium: 4
Sodium: 135

## 2011-03-08 LAB — TROPONIN I: Troponin I: 0.41 — ABNORMAL HIGH

## 2011-03-08 LAB — PROTIME-INR: INR: 1.1

## 2011-03-08 LAB — CARDIAC PANEL(CRET KIN+CKTOT+MB+TROPI)
Relative Index: 12.3 — ABNORMAL HIGH
Troponin I: 2.94

## 2011-03-08 LAB — CK TOTAL AND CKMB (NOT AT ARMC)
CK, MB: 7.7 — ABNORMAL HIGH
Relative Index: INVALID

## 2011-03-08 LAB — POCT CARDIAC MARKERS
CKMB, poc: 14.1
Troponin i, poc: 0.21 — ABNORMAL HIGH

## 2011-03-08 LAB — LIPID PANEL: Cholesterol: 233 — ABNORMAL HIGH

## 2011-03-08 LAB — APTT: aPTT: 94 — ABNORMAL HIGH

## 2011-03-09 ENCOUNTER — Encounter: Payer: Self-pay | Admitting: Cardiovascular Disease

## 2011-04-06 ENCOUNTER — Ambulatory Visit (INDEPENDENT_AMBULATORY_CARE_PROVIDER_SITE_OTHER): Payer: Federal, State, Local not specified - PPO | Admitting: Cardiovascular Disease

## 2011-04-06 ENCOUNTER — Encounter: Payer: Self-pay | Admitting: Cardiovascular Disease

## 2011-04-06 VITALS — BP 127/77 | HR 72 | Ht 67.5 in | Wt 196.1 lb

## 2011-04-06 DIAGNOSIS — I2510 Atherosclerotic heart disease of native coronary artery without angina pectoris: Secondary | ICD-10-CM

## 2011-04-06 NOTE — Patient Instructions (Signed)
Your physician wants you to follow-up in: 6 months You will receive a reminder letter in the mail two months in advance. If you don't receive a letter, please call our office to schedule the follow-up appointment.  Your physician recommends that you return for a FASTING lipid profile: 6 months   Your physician recommends that you continue on your current medications as directed. Please refer to the Current Medication list given to you today.  

## 2011-04-06 NOTE — Progress Notes (Signed)
Wanda Yang Date of Birth  1940-12-23 [facility] [address]    [address] [place], [place]  [address] [phone]  Fax  [phone]  History of Present Illness:  70 yo female with hx of CAD - s/p PCI.  She has a significant history of COPD. And partially she continues to smoke. She quit for about 4 weeks but unfortunately started back.  Has a history of hypertension. She also has a history of an abdominal aortic ulceration  Current Outpatient Prescriptions on File Prior to Visit  Medication Sig Dispense Refill  . albuterol (PROVENTIL HFA;VENTOLIN HFA) 108 (90 BASE) MCG/ACT inhaler Inhale 2 puffs into the lungs every 6 (six) hours as needed.        Marland Kitchen allopurinol (ZYLOPRIM) 100 MG tablet Take 100 mg by mouth 2 (two) times daily.        Marland Kitchen arformoterol (BROVANA) 15 MCG/2ML NEBU Take 2 mLs (15 mcg total) by nebulization 2 (two) times daily.  360 mL  3  . aspirin 81 MG tablet Take 81 mg by mouth daily.        . budesonide (PULMICORT) 0.5 MG/2ML nebulizer solution Take 2 mLs (0.5 mg total) by nebulization 2 (two) times daily.  360 mL  3  . clopidogrel (PLAVIX) 75 MG tablet Take 75 mg by mouth daily.        Marland Kitchen diltiazem (DILACOR XR) 240 MG 24 hr capsule Take 240 mg by mouth daily.        . Famotidine (PEPCID PO) Take by mouth as needed.        . furosemide (LASIX) 40 MG tablet Take 40 mg by mouth daily.       . nebivolol (BYSTOLIC) 10 MG tablet Take 5 mg by mouth daily.       . nitroGLYCERIN (NITROSTAT) 0.4 MG SL tablet Place 0.4 mg under the tongue every 5 (five) minutes as needed.        . OXYGEN-HELIUM IN Inhale 2 L into the lungs daily.        . rosuvastatin (CRESTOR) 10 MG tablet Take 10 mg by mouth daily.       Marland Kitchen tiotropium (SPIRIVA) 18 MCG inhalation capsule Place 18 mcg into inhaler and inhale daily.        Marland Kitchen venlafaxine (EFFEXOR) 75 MG tablet Take 75 mg by mouth daily.          Allergies  Allergen Reactions  . Ambien   . Uloric (Febuxostat)   . Vicodin  (Hydrocodone-Acetaminophen)   . Tetracycline Rash    Past Medical History  Diagnosis Date  . CAD (coronary artery disease)   . Atherosclerosis of abdominal aorta   . On home oxygen therapy   . Tobacco abuse   . HTN (hypertension)   . COPD (chronic obstructive pulmonary disease)   . PVD (peripheral vascular disease)   . Anxiety   . GERD (gastroesophageal reflux disease)   . RBBB (right bundle branch block)   . Tachycardia   . High cholesterol   . Renal insufficiency   . Leukocytosis   . Endometrial carcinoma     Past Surgical History  Procedure Date  . Coronary angioplasty   . Abdominal hysterectomy   . Carotid stent   . Tonsillectomy   . Arm surgery     PLATES AND SCREWS  . Breast reduction surgery     History  Smoking status  . Current Everyday Smoker -- 1.0 packs/day for 53 years  . Types:  Cigarettes  Smokeless tobacco  . Not on file    History  Alcohol Use No    Family History  Problem Relation Age of Onset  . Heart attack Father   . Coronary artery disease Father   . Leukemia Mother   . Leukemia Brother     Reviw of Systems:  Reviewed in the HPI.  All other systems are negative.  Physical Exam: BP 127/77  Pulse 72  Ht 5' 7.5" (1.715 m)  Wt 196 lb 1.9 oz (88.959 kg)  BMI 30.26 kg/m2 The patient is alert and oriented x 3.  The mood and affect are normal.   Skin: warm and dry.  Color is normal.    HEENT:   Normal carotids, no jvd  Lungs: decreased breath sounds in the upper lung fields   Heart: RR    Abdomen: non tender, normal BS  Extremities:  Trace- 1+ edema  Neuro:  Normal.    EKG: Normal sinus rhythm. She has a right bundle branch block.  There are no changes from her previous tracing.  Assessment / Plan:

## 2011-04-06 NOTE — Assessment & Plan Note (Signed)
Wanda Yang is doing very well. We'll continue with her same medications. Unfortunately she continues to smoke at least a half pack cigarettes a. I'll see her  again in 6 months. We'll check a lipid level, basic metabolic profile, and HFP at that time.

## 2011-06-08 ENCOUNTER — Encounter: Payer: Self-pay | Admitting: Gynecologic Oncology

## 2011-06-10 ENCOUNTER — Encounter: Payer: Self-pay | Admitting: Gynecology

## 2011-06-10 ENCOUNTER — Other Ambulatory Visit: Payer: Self-pay | Admitting: *Deleted

## 2011-06-10 ENCOUNTER — Ambulatory Visit: Payer: Federal, State, Local not specified - PPO | Attending: Gynecology | Admitting: Gynecology

## 2011-06-10 ENCOUNTER — Other Ambulatory Visit (HOSPITAL_COMMUNITY)
Admission: RE | Admit: 2011-06-10 | Discharge: 2011-06-10 | Disposition: A | Discharge status: Home / Self Care | Payer: Federal, State, Local not specified - PPO | Source: Ambulatory Visit | Attending: Gynecology | Admitting: Gynecology

## 2011-06-10 VITALS — BP 142/64 | HR 78 | Temp 97.7°F | Resp 22 | Ht 66.73 in | Wt 196.4 lb

## 2011-06-10 DIAGNOSIS — Z01419 Encounter for gynecological examination (general) (routine) without abnormal findings: Secondary | ICD-10-CM | POA: Insufficient documentation

## 2011-06-10 DIAGNOSIS — J449 Chronic obstructive pulmonary disease, unspecified: Secondary | ICD-10-CM | POA: Insufficient documentation

## 2011-06-10 DIAGNOSIS — I10 Essential (primary) hypertension: Secondary | ICD-10-CM | POA: Insufficient documentation

## 2011-06-10 DIAGNOSIS — I739 Peripheral vascular disease, unspecified: Secondary | ICD-10-CM | POA: Insufficient documentation

## 2011-06-10 DIAGNOSIS — I2510 Atherosclerotic heart disease of native coronary artery without angina pectoris: Secondary | ICD-10-CM | POA: Insufficient documentation

## 2011-06-10 DIAGNOSIS — F17200 Nicotine dependence, unspecified, uncomplicated: Secondary | ICD-10-CM | POA: Insufficient documentation

## 2011-06-10 DIAGNOSIS — C541 Malignant neoplasm of endometrium: Secondary | ICD-10-CM

## 2011-06-10 DIAGNOSIS — Z79899 Other long term (current) drug therapy: Secondary | ICD-10-CM | POA: Insufficient documentation

## 2011-06-10 DIAGNOSIS — J4489 Other specified chronic obstructive pulmonary disease: Secondary | ICD-10-CM | POA: Insufficient documentation

## 2011-06-10 DIAGNOSIS — I700 Atherosclerosis of aorta: Secondary | ICD-10-CM | POA: Insufficient documentation

## 2011-06-10 DIAGNOSIS — Z9981 Dependence on supplemental oxygen: Secondary | ICD-10-CM | POA: Insufficient documentation

## 2011-06-10 DIAGNOSIS — C549 Malignant neoplasm of corpus uteri, unspecified: Secondary | ICD-10-CM | POA: Insufficient documentation

## 2011-06-10 DIAGNOSIS — K219 Gastro-esophageal reflux disease without esophagitis: Secondary | ICD-10-CM | POA: Insufficient documentation

## 2011-06-10 MED ORDER — DILTIAZEM HCL ER 240 MG PO CP24: 240.0000 mg | ORAL_CAPSULE | Freq: Every day | ORAL | Status: DC

## 2011-06-10 NOTE — Patient Instructions (Signed)
Return to GYN oncology clinic in one year

## 2011-06-10 NOTE — Telephone Encounter (Signed)
Fax Received. Refill Completed. Wanda Yang (R.M.A)   

## 2011-06-10 NOTE — Progress Notes (Signed)
Consult Note: Gyn-Onc   Wanda Yang 71 y.o. female  Chief Complaint  Patient presents with  . Endometrial cancer    Follow-up    Interval History: The patient returns today for scheduled followup. Since her last visit she saw Dr. Teodora Yang. She reports no GI or GU symptoms are functional status is stable. 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  HPI: The patient underwent a vaginal hysterectomy and bilateral salpingo-oophorectomy on 02/23/2010 for endometrial cancer. Final pathology showed a grade 2 endometrial carcinoma confined to an endometrial polyp. No additional therapy was recommended. (Patient underwent vaginal hysterectomy because of her significant medical comorbidities)  Allergies  Allergen Reactions  . Ambien   . Uloric (Febuxostat)   . Vicodin (Hydrocodone-Acetaminophen)   . Tetracycline Rash    Past Medical History  Diagnosis Date  . CAD (coronary artery disease)   . Atherosclerosis of abdominal aorta   . On home oxygen therapy   . Tobacco abuse   . HTN (hypertension)   . COPD (chronic obstructive pulmonary disease)   . PVD (peripheral vascular disease)   . Anxiety   . GERD (gastroesophageal reflux disease)   . RBBB (right bundle branch block)   . Tachycardia   . High cholesterol   . Renal insufficiency   . Leukocytosis   . Endometrial carcinoma     Past Surgical History  Procedure Date  . Coronary angioplasty   . Abdominal hysterectomy   . Carotid stent   . Tonsillectomy   . Arm surgery     PLATES AND SCREWS  . Breast reduction surgery     Current Outpatient Prescriptions  Medication Sig Dispense Refill  . albuterol (PROVENTIL HFA;VENTOLIN HFA) 108 (90 BASE) MCG/ACT inhaler Inhale 2 puffs into the lungs every 6 (six) hours as needed.        Marland Kitchen arformoterol (BROVANA) 15 MCG/2ML NEBU Take 2 mLs (15 mcg total) by nebulization 2 (two) times  daily.  360 mL  3  . aspirin 81 MG tablet Take 81 mg by mouth daily.        . budesonide (PULMICORT) 0.5 MG/2ML nebulizer solution Take 2 mLs (0.5 mg total) by nebulization 2 (two) times daily.  360 mL  3  . Casanthranol-Docusate Sodium 30-100 MG CAPS Take by mouth as needed.        . clopidogrel (PLAVIX) 75 MG tablet Take 75 mg by mouth daily.        . Famotidine (PEPCID PO) Take by mouth as needed.        . febuxostat (ULORIC) 40 MG tablet Take 40 mg by mouth daily.        . furosemide (LASIX) 40 MG tablet Take 40 mg by mouth daily.       . IRON PO Take 65 mg by mouth daily.        . nebivolol (BYSTOLIC) 10 MG tablet Take 5 mg by mouth daily.       . nitroGLYCERIN (NITROSTAT) 0.4 MG SL tablet Place 0.4 mg under the tongue every 5 (five) minutes as needed.        . OXYGEN-HELIUM IN Inhale 2 L into the lungs daily.        . rosuvastatin (CRESTOR) 10 MG tablet Take 10 mg by mouth daily.       Marland Kitchen tiotropium (SPIRIVA) 18 MCG inhalation capsule Place 18 mcg into  inhaler and inhale daily.        Marland Kitchen venlafaxine (EFFEXOR) 75 MG tablet Take 75 mg by mouth daily.        . Vitamin D, Ergocalciferol, (DRISDOL) 50000 UNITS CAPS Take 50,000 Units by mouth once a week.        Marland Kitchen allopurinol (ZYLOPRIM) 100 MG tablet Take 100 mg by mouth 2 (two) times daily.        Marland Kitchen diltiazem (DILACOR XR) 240 MG 24 hr capsule Take 1 capsule (240 mg total) by mouth daily.  30 capsule  3    History   Social History  . Marital Status: Married    Spouse Name: N/A    Number of Children: N/A  . Years of Education: N/A   Occupational History  . Not on file.   Social History Main Topics  . Smoking status: Current Everyday Smoker -- 1.0 packs/day for 53 years    Types: Cigarettes  . Smokeless tobacco: Not on file  . Alcohol Use: Yes     occassional  . Drug Use: No  . Sexually Active: No   Other Topics Concern  . Not on file   Social History Narrative  . No narrative on file    Family History  Problem Relation  Age of Onset  . Heart attack Father   . Coronary artery disease Father   . Leukemia Mother   . Leukemia Brother     Review of Systems: 10 point review of systems is negative except as noted above  Vitals: Blood pressure 142/64, pulse 78, temperature 97.7 F (36.5 C), temperature source Oral, resp. rate 22, height 5' 6.73" (1.695 m), weight 196 lb 6.4 oz (89.086 kg).  Physical Exam: General the patient is a pleasant white female with nasal oxygen on.  HEENT is negative  Neck is supple without thyromegaly  There is no supraclavicular or inguinal adenopathy  The abdomen is soft nontender no masses organomegaly ascites or hernias are noted  Pelvic exam  EGBUS vagina bladder urethra are normal  The vaginal cuff is well healed and no lesions are noted Pap smears or obtained  Cervix and uterus are surgically absent  Bimanual examination reveals no masses induration or nodularity  Rectovaginal exam confirms  Lower extremities are 1+ ankle edema.  Assessment/Plan: Stage IA grade 2 endometrial carcinoma undergo initial surgery September 2011. The patient is clinically free of disease  Pap smears or obtained  She return to see Dr. Chevis Yang her in 6 months and return to see [place] in one year   Wanda Corpus, MD 06/10/2011, 3:36 PM                         Consult Note: Gyn-Onc   Wanda Yang 71 y.o. female  Chief Complaint  Patient presents with  . Endometrial cancer    Follow-up    Interval History:   HPI:  Allergies  Allergen Reactions  . Ambien   . Uloric (Febuxostat)   . Vicodin (Hydrocodone-Acetaminophen)   . Tetracycline Rash    Past Medical History  Diagnosis Date  . CAD (coronary artery disease)   . Atherosclerosis of abdominal aorta   . On home oxygen therapy   . Tobacco abuse   . HTN (hypertension)   . COPD (chronic obstructive pulmonary disease)   . PVD (peripheral vascular disease)   . Anxiety   . GERD  (gastroesophageal reflux disease)   . RBBB (right bundle branch block)   .  Tachycardia   . High cholesterol   . Renal insufficiency   . Leukocytosis   . Endometrial carcinoma     Past Surgical History  Procedure Date  . Coronary angioplasty   . Abdominal hysterectomy   . Carotid stent   . Tonsillectomy   . Arm surgery     PLATES AND SCREWS  . Breast reduction surgery     Current Outpatient Prescriptions  Medication Sig Dispense Refill  . albuterol (PROVENTIL HFA;VENTOLIN HFA) 108 (90 BASE) MCG/ACT inhaler Inhale 2 puffs into the lungs every 6 (six) hours as needed.        Marland Kitchen arformoterol (BROVANA) 15 MCG/2ML NEBU Take 2 mLs (15 mcg total) by nebulization 2 (two) times daily.  360 mL  3  . aspirin 81 MG tablet Take 81 mg by mouth daily.        . budesonide (PULMICORT) 0.5 MG/2ML nebulizer solution Take 2 mLs (0.5 mg total) by nebulization 2 (two) times daily.  360 mL  3  . Casanthranol-Docusate Sodium 30-100 MG CAPS Take by mouth as needed.        . clopidogrel (PLAVIX) 75 MG tablet Take 75 mg by mouth daily.        . Famotidine (PEPCID PO) Take by mouth as needed.        . febuxostat (ULORIC) 40 MG tablet Take 40 mg by mouth daily.        . furosemide (LASIX) 40 MG tablet Take 40 mg by mouth daily.       . IRON PO Take 65 mg by mouth daily.        . nebivolol (BYSTOLIC) 10 MG tablet Take 5 mg by mouth daily.       . nitroGLYCERIN (NITROSTAT) 0.4 MG SL tablet Place 0.4 mg under the tongue every 5 (five) minutes as needed.        . OXYGEN-HELIUM IN Inhale 2 L into the lungs daily.        . rosuvastatin (CRESTOR) 10 MG tablet Take 10 mg by mouth daily.       Marland Kitchen tiotropium (SPIRIVA) 18 MCG inhalation capsule Place 18 mcg into inhaler and inhale daily.        Marland Kitchen venlafaxine (EFFEXOR) 75 MG tablet Take 75 mg by mouth daily.        . Vitamin D, Ergocalciferol, (DRISDOL) 50000 UNITS CAPS Take 50,000 Units by mouth once a week.        Marland Kitchen allopurinol (ZYLOPRIM) 100 MG tablet Take 100 mg by  mouth 2 (two) times daily.        Marland Kitchen diltiazem (DILACOR XR) 240 MG 24 hr capsule Take 1 capsule (240 mg total) by mouth daily.  30 capsule  3    History   Social History  . Marital Status: Married    Spouse Name: N/A    Number of Children: N/A  . Years of Education: N/A   Occupational History  . Not on file.   Social History Main Topics  . Smoking status: Current Everyday Smoker -- 1.0 packs/day for 53 years    Types: Cigarettes  . Smokeless tobacco: Not on file  . Alcohol Use: Yes     occassional  . Drug Use: No  . Sexually Active: No   Other Topics Concern  . Not on file   Social History Narrative  . No narrative on file    Family History  Problem Relation Age of Onset  . Heart attack Father   .  Coronary artery disease Father   . Leukemia Mother   . Leukemia Brother     Review of Systems:  Vitals: Blood pressure 142/64, pulse 78, temperature 97.7 F (36.5 C), temperature source Oral, resp. rate 22, height 5' 6.73" (1.695 m), weight 196 lb 6.4 oz (89.086 kg).  Physical Exam:  Assessment/Plan:   Wanda Corpus, MD 06/10/2011, 3:36 PM

## 2011-07-29 ENCOUNTER — Encounter: Payer: Self-pay | Admitting: Pulmonary Disease

## 2011-07-29 ENCOUNTER — Ambulatory Visit (INDEPENDENT_AMBULATORY_CARE_PROVIDER_SITE_OTHER): Payer: Federal, State, Local not specified - PPO | Admitting: Pulmonary Disease

## 2011-07-29 DIAGNOSIS — J449 Chronic obstructive pulmonary disease, unspecified: Secondary | ICD-10-CM

## 2011-07-29 DIAGNOSIS — J9610 Chronic respiratory failure, unspecified whether with hypoxia or hypercapnia: Secondary | ICD-10-CM

## 2011-07-29 MED ORDER — PREDNISONE 10 MG PO TABS: ORAL_TABLET | ORAL | Status: DC

## 2011-07-29 NOTE — Patient Instructions (Signed)
Stay on current medications, and work hard on smoking cessation. Will treat with a short course of prednisone to see if we can improve your breathing. Stay on your oxygen followup with me in 6mos.

## 2011-07-29 NOTE — Assessment & Plan Note (Signed)
The patient has been staying on her bronchodilator regimen and oxygen, but continues to smoke.  Her history today is suggestive of an early COPD exacerbation due to airway inflammation, and therefore I will treat her with a short course of prednisone.  I have stressed to her again that she must stop smoking, or she will continue to have worsening of her lung function as well as recurrent flareups.  She will continue on her current bronchodilator regimen.

## 2011-07-29 NOTE — Progress Notes (Signed)
  Subjective:    Patient ID: Wanda Yang, female    DOB: June 24, 1940, 71 y.o.   MRN: [id number]  HPI The patient comes in today for followup of her known chronic respiratory failure secondary to severe emphysema.  She is wearing oxygen compliantly, and is using her nebulizer treatments.  Unfortunately, she continues to smoke, and notes a 3-4 week history of worsening dyspnea on exertion.  She has a mild increase in cough, but no congestion or purulent mucus.   Review of Systems  Constitutional: Negative for fever and unexpected weight change.  HENT: Positive for nosebleeds and sneezing. Negative for ear pain, congestion, sore throat, rhinorrhea, trouble swallowing, dental problem, postnasal drip and sinus pressure.   Eyes: Positive for itching. Negative for redness.  Respiratory: Positive for chest tightness and shortness of breath. Negative for cough and wheezing.   Cardiovascular: Positive for leg swelling. Negative for palpitations.  Gastrointestinal: Positive for nausea. Negative for vomiting.  Genitourinary: Negative for dysuria.  Musculoskeletal: Positive for joint swelling.  Skin: Negative for rash.  Neurological: Positive for headaches.  Hematological: Bruises/bleeds easily.  Psychiatric/Behavioral: Negative for dysphoric mood. The patient is not nervous/anxious.        Objective:   Physical Exam Overweight female in no acute distress Nose without purulence or discharge noted Chest with decreased breath sounds throughout, no wheezing  Cardiac exam with regular rate and rhythm Lower extremities with 1+ ankle edema, no cyanosis Alert and oriented, moves all 4 extremities.       Assessment & Plan:

## 2011-08-02 ENCOUNTER — Other Ambulatory Visit: Payer: Self-pay | Admitting: Cardiovascular Disease

## 2011-08-08 ENCOUNTER — Telehealth: Payer: Self-pay | Admitting: Pulmonary Disease

## 2011-08-08 ENCOUNTER — Other Ambulatory Visit: Payer: Self-pay | Admitting: Pulmonary Disease

## 2011-08-08 NOTE — Telephone Encounter (Signed)
Because these meds are inhaled, they are unlikely to cause her nose symptoms. Make sure she is not sniffing the meds up her nose.  More than likley is due to dryness from the winter/use of heat.  I would like her to get nasal saline spray and use 3-4 times a day for next 3 weeks to see if things improve.

## 2011-08-08 NOTE — Telephone Encounter (Signed)
Spoke with patient-states she has been having off and on for 2 years now, blood clots from nose. Is wondering if this could be caused by Pulmicort, Brovana, or Spiriva as it started when she started this combo. I informed patient I am not aware of these meds causing nose blood clots but will confirm with [facility] and call her back with response. Please advise. Thanks.

## 2011-08-08 NOTE — Telephone Encounter (Signed)
Pt aware of recs. She confirmed she is using the medications through her mouth. Carron Curie, CMA

## 2011-08-26 ENCOUNTER — Encounter: Payer: Self-pay | Admitting: Cardiovascular Disease

## 2011-09-19 ENCOUNTER — Other Ambulatory Visit: Payer: Self-pay | Admitting: Pulmonary Disease

## 2011-09-19 MED ORDER — ALBUTEROL SULFATE HFA 108 (90 BASE) MCG/ACT IN AERS: 2.0000 | INHALATION_SPRAY | Freq: Four times a day (QID) | RESPIRATORY_TRACT | Status: DC | PRN

## 2011-09-20 ENCOUNTER — Ambulatory Visit (INDEPENDENT_AMBULATORY_CARE_PROVIDER_SITE_OTHER): Payer: Federal, State, Local not specified - PPO | Admitting: Cardiovascular Disease

## 2011-09-20 ENCOUNTER — Encounter: Payer: Self-pay | Admitting: Cardiovascular Disease

## 2011-09-20 VITALS — BP 143/70 | HR 71 | Ht 68.0 in | Wt 197.8 lb

## 2011-09-20 DIAGNOSIS — Z720 Tobacco use: Secondary | ICD-10-CM

## 2011-09-20 DIAGNOSIS — F17200 Nicotine dependence, unspecified, uncomplicated: Secondary | ICD-10-CM

## 2011-09-20 DIAGNOSIS — I2510 Atherosclerotic heart disease of native coronary artery without angina pectoris: Secondary | ICD-10-CM

## 2011-09-20 MED ORDER — NEBIVOLOL HCL 10 MG PO TABS: 5.0000 mg | ORAL_TABLET | Freq: Every day | ORAL | Status: DC

## 2011-09-20 MED ORDER — FUROSEMIDE 40 MG PO TABS: 40.0000 mg | ORAL_TABLET | Freq: Every day | ORAL | Status: DC

## 2011-09-20 MED ORDER — DILTIAZEM HCL ER 240 MG PO CP24: 240.0000 mg | ORAL_CAPSULE | Freq: Every day | ORAL | Status: DC

## 2011-09-20 NOTE — Progress Notes (Signed)
Wanda Yang Date of Birth  Dec 09, 1940 [facility] [address]    [address] [place], [place]  [address] [phone]  Fax  [phone]  Problem: 1. Coronary artery disease-status post PTCA and stenting of her left complex artery as well as right coronary artery 2. Abdominal aortic ulceration/atherosclerosis 3. Continued cigarette use-even while using home oxygen 4. Hypertension 5. COPD 6. Gout- becomes dyspneic with allopurinol  History of Present Illness:  71 yo female with hx of CAD - s/p PCI.  She has a significant history of COPD.  she continues to smoke. She quit for about 4 weeks but unfortunately started back.  Has a history of hypertension. She also has a history of an abdominal aortic ulceration.  She continues to have significant dyspnea.  She complains of leg fatigue. She does not have any claudication.  She's been taking some allopurinol but apparently this makes her even more short of breath.  She also complains of burning in both her feet and toes.    Current Outpatient Prescriptions on File Prior to Visit  Medication Sig Dispense Refill  . albuterol (PROVENTIL HFA;VENTOLIN HFA) 108 (90 BASE) MCG/ACT inhaler Inhale 2 puffs into the lungs every 6 (six) hours as needed.  1 Inhaler  11  . allopurinol (ZYLOPRIM) 100 MG tablet Take 100 mg by mouth 2 (two) times daily.        Marland Kitchen arformoterol (BROVANA) 15 MCG/2ML NEBU Take 2 mLs (15 mcg total) by nebulization 2 (two) times daily.  360 mL  3  . aspirin 81 MG tablet Take 81 mg by mouth daily.        . budesonide (PULMICORT) 0.5 MG/2ML nebulizer solution Take 2 mLs (0.5 mg total) by nebulization 2 (two) times daily.  360 mL  3  . Casanthranol-Docusate Sodium 30-100 MG CAPS Take by mouth as needed.        . clopidogrel (PLAVIX) 75 MG tablet TAKE 1 TABLET DAILY  90 tablet  3  . diltiazem (DILACOR XR) 240 MG 24 hr capsule Take 1 capsule (240 mg total) by mouth daily.  30 capsule  3  . Famotidine (PEPCID PO) Take by  mouth as needed.        . furosemide (LASIX) 40 MG tablet Take 40 mg by mouth daily.       . IRON PO Take 65 mg by mouth daily.        . nebivolol (BYSTOLIC) 10 MG tablet Take 5 mg by mouth daily.       . nitroGLYCERIN (NITROSTAT) 0.4 MG SL tablet Place 0.4 mg under the tongue every 5 (five) minutes as needed.        . OXYGEN-HELIUM IN Inhale 2 L into the lungs daily.        . rosuvastatin (CRESTOR) 10 MG tablet Take 10 mg by mouth daily.       Marland Kitchen SPIRIVA HANDIHALER 18 MCG inhalation capsule INHALE THE CONTENTS OF 1   CAPSULE IN HANDIHALER DAILY  90 capsule  3  . venlafaxine (EFFEXOR) 75 MG tablet Take 75 mg by mouth 2 (two) times daily.         Allergies  Allergen Reactions  . Ambien   . Uloric (Febuxostat)   . Vicodin (Hydrocodone-Acetaminophen)   . Tetracycline Rash    Past Medical History  Diagnosis Date  . CAD (coronary artery disease)   . Atherosclerosis of abdominal aorta   . On home oxygen therapy   . Tobacco abuse   .  HTN (hypertension)   . COPD (chronic obstructive pulmonary disease)   . PVD (peripheral vascular disease)   . Anxiety   . GERD (gastroesophageal reflux disease)   . RBBB (right bundle branch block)   . Tachycardia   . High cholesterol   . Renal insufficiency   . Leukocytosis   . Endometrial carcinoma     Past Surgical History  Procedure Date  . Coronary angioplasty   . Abdominal hysterectomy   . Carotid stent   . Tonsillectomy   . Arm surgery     PLATES AND SCREWS  . Breast reduction surgery     History  Smoking status  . Current Everyday Smoker -- 1.0 packs/day for 53 years  . Types: Cigarettes  Smokeless tobacco  . Not on file  Comment: currently smoking 3/4 ppd.     History  Alcohol Use  . Yes    occassional    Family History  Problem Relation Age of Onset  . Heart attack Father   . Coronary artery disease Father   . Leukemia Mother   . Leukemia Brother     Reviw of Systems:  Reviewed in the HPI.  All other systems are  negative.  Physical Exam: BP 143/70  Pulse 71  Ht 5\' 8"  (1.727 m)  Wt 197 lb 12.8 oz (89.721 kg)  BMI 30.08 kg/m2 The patient is alert and oriented x 3.  The mood and affect are normal.   Skin: warm and dry.  Color is normal.    HEENT:   Normal carotids, no jvd  Lungs: decreased breath sounds in the upper lung fields   Heart: RR    Abdomen: non tender, normal BS  Extremities:  Trace- 1-2+ edema  Neuro:  Normal.    EKG: Normal sinus rhythm. She has a right bundle branch block.  There are no changes from her previous tracing.  Assessment / Plan:

## 2011-09-20 NOTE — Assessment & Plan Note (Signed)
Wanda Yang is not having episodes of chest pain. Once again encouraged her to stop smoking. 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

## 2011-09-20 NOTE — Patient Instructions (Addendum)
Your physician wants you to follow-up in: 6 months You will receive a reminder letter in the mail two months in advance. If you don't receive a letter, please call our office to schedule the follow-up appointment.  Your physician recommends that you return for a FASTING lipid profile: 6 months, PLEASE GET COPY OF LABS BMET/ CHOLESTEROL/ HEPATIC FOR YOUR CHART HERE, THANK YOU  Smoking Cessation This document explains the best ways for you to quit smoking and new treatments to help. It lists new medicines that can double or triple your chances of quitting and quitting for good. It also considers ways to avoid relapses and concerns you may have about quitting, including weight gain. NICOTINE: A POWERFUL ADDICTION If you have tried to quit smoking, you know how hard it can be. It is hard because nicotine is a very addictive drug. For some people, it can be as addictive as heroin or cocaine. Usually, people make 2 or 3 tries, or more, before finally being able to quit. Each time you try to quit, you can learn about what helps and what hurts. Quitting takes hard work and a lot of effort, but you can quit smoking. QUITTING SMOKING IS ONE OF THE MOST IMPORTANT THINGS YOU WILL EVER DO.  You will live longer, feel better, and live better.   The impact on your body of quitting smoking is felt almost immediately:   Within 20 minutes, blood pressure decreases. Pulse returns to its normal level.   After 8 hours, carbon monoxide levels in the blood return to normal. Oxygen level increases.   After 24 hours, chance of heart attack starts to decrease. Breath, hair, and body stop smelling like smoke.   After 48 hours, damaged nerve endings begin to recover. Sense of taste and smell improve.   After 72 hours, the body is virtually free of nicotine. Bronchial tubes relax and breathing becomes easier.   After 2 to 12 weeks, lungs can hold more air. Exercise becomes easier and circulation improves.   Quitting  will reduce your risk of having a heart attack, stroke, cancer, or lung disease:   After 1 year, the risk of coronary heart disease is cut in half.   After 5 years, the risk of stroke falls to the same as a nonsmoker.   After 10 years, the risk of lung cancer is cut in half and the risk of other cancers decreases significantly.   After 15 years, the risk of coronary heart disease drops, usually to the level of a nonsmoker.   If you are pregnant, quitting smoking will improve your chances of having a healthy baby.   The people you live with, especially your children, will be healthier.   You will have extra money to spend on things other than cigarettes.  FIVE KEYS TO QUITTING Studies have shown that these 5 steps will help you quit smoking and quit for good. You have the best chances of quitting if you use them together: 1. Get ready.  2. Get support and encouragement.  3. Learn new skills and behaviors.  4. Get medicine to reduce your nicotine addiction and use it correctly.  5. Be prepared for relapse or difficult situations. Be determined to continue trying to quit, even if you do not succeed at first.  1. GET READY  Set a quit date.   Change your environment.   Get rid of ALL cigarettes, ashtrays, matches, and lighters in your home, car, and place of work.   Do  not let people smoke in your home.   Review your past attempts to quit. Think about what worked and what did not.   Once you quit, do not smoke. NOT EVEN A PUFF!  2. GET SUPPORT AND ENCOURAGEMENT Studies have shown that you have a better chance of being successful if you have help. You can get support in many ways.  Tell your family, friends, and coworkers that you are going to quit and need their support. Ask them not to smoke around you.   Talk to your caregivers (doctor, dentist, nurse, pharmacist, psychologist, and/or smoking counselor).   Get individual, group, or telephone counseling and support. The more  counseling you have, the better your chances are of quitting. Programs are available at [employer] and health centers. Call your local health department for information about programs in your area.   Spiritual beliefs and practices may help some smokers quit.   Quit meters are Photographer that keep track of quit statistics, such as amount of "quit-time," cigarettes not smoked, and money saved.   Many smokers find one or more of the many self-help books available useful in helping them quit and stay off tobacco.  3. LEARN NEW SKILLS AND BEHAVIORS  Try to distract yourself from urges to smoke. Talk to someone, go for a walk, or occupy your time with a task.   When you first try to quit, change your routine. Take a different route to work. Drink tea instead of coffee. Eat breakfast in a different place.   Do something to reduce your stress. Take a hot bath, exercise, or read a book.   Plan something enjoyable to do every day. Reward yourself for not smoking.   Explore interactive web-based programs that specialize in helping you quit.  4. GET MEDICINE AND USE IT CORRECTLY Medicines can help you stop smoking and decrease the urge to smoke. Combining medicine with the above behavioral methods and support can quadruple your chances of successfully quitting smoking. The U.S. Food and Drug Administration (FDA) has approved 7 medicines to help you quit smoking. These medicines fall into 3 categories.  Nicotine replacement therapy (delivers nicotine to your body without the negative effects and risks of smoking):   Nicotine gum: Available over-the-counter.   Nicotine lozenges: Available over-the-counter.   Nicotine inhaler: Available by prescription.   Nicotine nasal spray: Available by prescription.   Nicotine skin patches (transdermal): Available by prescription and over-the-counter.   Antidepressant medicine (helps people abstain from smoking, but  how this works is unknown):   Bupropion sustained-release (SR) tablets: Available by prescription.   Nicotinic receptor partial agonist (simulates the effect of nicotine in your brain):   Varenicline tartrate tablets: Available by prescription.   Ask your caregiver for advice about which medicines to use and how to use them. Carefully read the information on the package.   Everyone who is trying to quit may benefit from using a medicine. If you are pregnant or trying to become pregnant, nursing an infant, you are under age 24, or you smoke fewer than 10 cigarettes per day, talk to your caregiver before taking any nicotine replacement medicines.   You should stop using a nicotine replacement product and call your caregiver if you experience nausea, dizziness, weakness, vomiting, fast or irregular heartbeat, mouth problems with the lozenge or gum, or redness or swelling of the skin around the patch that does not go away.   Do not use any other product containing  nicotine while using a nicotine replacement product.   Talk to your caregiver before using these products if you have diabetes, heart disease, asthma, stomach ulcers, you had a recent heart attack, you have high blood pressure that is not controlled with medicine, a history of irregular heartbeat, or you have been prescribed medicine to help you quit smoking.  5. BE PREPARED FOR RELAPSE OR DIFFICULT SITUATIONS  Most relapses occur within the first 3 months after quitting. Do not be discouraged if you start smoking again. Remember, most people try several times before they finally quit.   You may have symptoms of withdrawal because your body is used to nicotine. You may crave cigarettes, be irritable, feel very hungry, cough often, get headaches, or have difficulty concentrating.   The withdrawal symptoms are only temporary. They are strongest when you first quit, but they will go away within 10 to 14 days.  Here are some difficult  situations to watch for:  Alcohol. Avoid drinking alcohol. Drinking lowers your chances of successfully quitting.   Caffeine. Try to reduce the amount of caffeine you consume. It also lowers your chances of successfully quitting.   Other smokers. Being around smoking can make you want to smoke. Avoid smokers.   Weight gain. Many smokers will gain weight when they quit, usually less than 10 pounds. Eat a healthy diet and stay active. Do not let weight gain distract you from your main goal, quitting smoking. Some medicines that help you quit smoking may also help delay weight gain. You can always lose the weight gained after you quit.   Bad mood or depression. There are a lot of ways to improve your mood other than smoking.  If you are having problems with any of these situations, talk to your caregiver. SPECIAL SITUATIONS AND CONDITIONS Studies suggest that everyone can quit smoking. Your situation or condition can give you a special reason to quit.  Pregnant women/new mothers: By quitting, you protect your baby's health and your own.   Hospitalized patients: By quitting, you reduce health problems and help healing.   Heart attack patients: By quitting, you reduce your risk of a second heart attack.   Lung, head, and neck cancer patients: By quitting, you reduce your chance of a second cancer.   Parents of children and adolescents: By quitting, you protect your children from illnesses caused by secondhand smoke.  QUESTIONS TO THINK ABOUT Think about the following questions before you try to stop smoking. You may want to talk about your answers with your caregiver.  Why do you want to quit?   If you tried to quit in the past, what helped and what did not?   What will be the most difficult situations for you after you quit? How will you plan to handle them?   Who can help you through the tough times? Your family? Friends? Caregiver?   What pleasures do you get from smoking? What ways  can you still get pleasure if you quit?  Here are some questions to ask your caregiver:  How can you help me to be successful at quitting?   What medicine do you think would be best for me and how should I take it?   What should I do if I need more help?   What is smoking withdrawal like? How can I get information on withdrawal?  Quitting takes hard work and a lot of effort, but you can quit smoking. FOR MORE INFORMATION  Smokefree.gov (http://www.davis-sullivan.com/) provides free,  accurate, evidence-based information and professional assistance to help support the immediate and long-term needs of people trying to quit smoking. Document Released: 05/17/2001 Document Revised: 05/12/2011 Document Reviewed: 03/09/2009 [facility] Patient Information 2012 [place], [place].

## 2011-09-20 NOTE — Assessment & Plan Note (Signed)
Once again strongly remind her to stop smoking. We searched [facility]uerger's Disease in the shoulder some pictures of ischemic hands that were due to cigarette smoking. She tells me that her teeth do not look similar to this.  I have warned her about  smoking with her home oxygen. I discussed the fact that the swelling in her feet are due to right-sided heart failure which is due to her COPD.

## 2011-09-28 ENCOUNTER — Other Ambulatory Visit: Payer: Self-pay | Admitting: *Deleted

## 2011-09-28 MED ORDER — ALBUTEROL SULFATE HFA 108 (90 BASE) MCG/ACT IN AERS: 2.0000 | INHALATION_SPRAY | RESPIRATORY_TRACT | Status: DC | PRN

## 2011-10-03 ENCOUNTER — Other Ambulatory Visit: Payer: Self-pay | Admitting: *Deleted

## 2011-10-03 MED ORDER — DILTIAZEM HCL ER 240 MG PO CP24: 240.0000 mg | ORAL_CAPSULE | Freq: Every day | ORAL | Status: DC

## 2011-10-03 MED ORDER — NEBIVOLOL HCL 10 MG PO TABS: 5.0000 mg | ORAL_TABLET | Freq: Every day | ORAL | Status: DC

## 2011-10-03 MED ORDER — FUROSEMIDE 40 MG PO TABS: 40.0000 mg | ORAL_TABLET | Freq: Every day | ORAL | Status: DC

## 2011-11-16 ENCOUNTER — Other Ambulatory Visit: Payer: Self-pay | Admitting: Cardiovascular Disease

## 2011-11-16 MED ORDER — ROSUVASTATIN CALCIUM 10 MG PO TABS: 10.0000 mg | ORAL_TABLET | Freq: Every day | ORAL | Status: DC

## 2011-11-16 NOTE — Telephone Encounter (Signed)
Fax Received. Refill Completed. Tejay Hubert Chowoe (R.M.A)   

## 2011-11-16 NOTE — Telephone Encounter (Signed)
Out of pills 

## 2011-12-28 ENCOUNTER — Telehealth: Payer: Self-pay | Admitting: Cardiovascular Disease

## 2011-12-28 MED ORDER — NEBIVOLOL HCL 10 MG PO TABS: 5.0000 mg | ORAL_TABLET | Freq: Every day | ORAL | Status: DC

## 2011-12-28 NOTE — Telephone Encounter (Signed)
..   Requested Prescriptions   Signed Prescriptions Disp Refills  . nebivolol (BYSTOLIC) 10 MG tablet 90 tablet 2    Sig: Take 0.5 tablets (5 mg total) by mouth daily.    Authorizing Provider: [name]    Ordering User: Christella Hartigan, Daoud Lobue Judie Petit

## 2011-12-28 NOTE — Telephone Encounter (Signed)
New Problem:   Patient's husband called in wanting his wife's nebivolol (BYSTOLIC) 10 MG tablet to be filled for 90 individual tablets, not 45 individual tablets for 90 doses.  Please call back once the order has been placed.

## 2011-12-30 ENCOUNTER — Telehealth: Payer: Self-pay | Admitting: Pulmonary Disease

## 2011-12-30 NOTE — Telephone Encounter (Signed)
Called spoke with patient who reported that she is having some increased SOB - stated she is getting over "a bout with acute bronchitis" and is unable to totally recover.  Pt denies cough, wheezing, tightness in chest, f/c/s.  Advised pt [facility] is not in the office on Monday 7.29.13 but does have an opening on 7.30.13 @ 1030.  Pt declined ov with another provider.  Ov with KC scheduled 7.30.13 @ 1030.  Pt to seek emergency help if her breathing worsens prior to appt.  Pt verbalized her understanding.  Nothing further needed.

## 2012-01-01 ENCOUNTER — Encounter (HOSPITAL_COMMUNITY): Payer: Self-pay | Admitting: *Deleted

## 2012-01-01 ENCOUNTER — Emergency Department (HOSPITAL_COMMUNITY): Payer: Federal, State, Local not specified - PPO

## 2012-01-01 ENCOUNTER — Inpatient Hospital Stay (HOSPITAL_COMMUNITY)
Admission: EM | Admit: 2012-01-01 | Discharge: 2012-01-06 | DRG: 541 | Disposition: A | Discharge status: Home / Self Care | Payer: Federal, State, Local not specified - PPO | Attending: Internal Medicine | Admitting: Internal Medicine

## 2012-01-01 DIAGNOSIS — J9610 Chronic respiratory failure, unspecified whether with hypoxia or hypercapnia: Secondary | ICD-10-CM | POA: Diagnosis present

## 2012-01-01 DIAGNOSIS — I700 Atherosclerosis of aorta: Secondary | ICD-10-CM

## 2012-01-01 DIAGNOSIS — J441 Chronic obstructive pulmonary disease with (acute) exacerbation: Principal | ICD-10-CM | POA: Diagnosis present

## 2012-01-01 DIAGNOSIS — J449 Chronic obstructive pulmonary disease, unspecified: Secondary | ICD-10-CM

## 2012-01-01 DIAGNOSIS — Z7902 Long term (current) use of antithrombotics/antiplatelets: Secondary | ICD-10-CM

## 2012-01-01 DIAGNOSIS — D72829 Elevated white blood cell count, unspecified: Secondary | ICD-10-CM | POA: Diagnosis present

## 2012-01-01 DIAGNOSIS — R000 Tachycardia, unspecified: Secondary | ICD-10-CM

## 2012-01-01 DIAGNOSIS — I739 Peripheral vascular disease, unspecified: Secondary | ICD-10-CM | POA: Diagnosis present

## 2012-01-01 DIAGNOSIS — F17200 Nicotine dependence, unspecified, uncomplicated: Secondary | ICD-10-CM | POA: Diagnosis present

## 2012-01-01 DIAGNOSIS — Z7982 Long term (current) use of aspirin: Secondary | ICD-10-CM

## 2012-01-01 DIAGNOSIS — K219 Gastro-esophageal reflux disease without esophagitis: Secondary | ICD-10-CM

## 2012-01-01 DIAGNOSIS — Z720 Tobacco use: Secondary | ICD-10-CM | POA: Diagnosis present

## 2012-01-01 DIAGNOSIS — Z9981 Dependence on supplemental oxygen: Secondary | ICD-10-CM

## 2012-01-01 DIAGNOSIS — F329 Major depressive disorder, single episode, unspecified: Secondary | ICD-10-CM | POA: Diagnosis present

## 2012-01-01 DIAGNOSIS — E785 Hyperlipidemia, unspecified: Secondary | ICD-10-CM | POA: Diagnosis present

## 2012-01-01 DIAGNOSIS — F419 Anxiety disorder, unspecified: Secondary | ICD-10-CM

## 2012-01-01 DIAGNOSIS — F3289 Other specified depressive episodes: Secondary | ICD-10-CM | POA: Diagnosis present

## 2012-01-01 DIAGNOSIS — I509 Heart failure, unspecified: Secondary | ICD-10-CM | POA: Diagnosis present

## 2012-01-01 DIAGNOSIS — N289 Disorder of kidney and ureter, unspecified: Secondary | ICD-10-CM | POA: Diagnosis present

## 2012-01-01 DIAGNOSIS — N183 Chronic kidney disease, stage 3 unspecified: Secondary | ICD-10-CM | POA: Diagnosis present

## 2012-01-01 DIAGNOSIS — R0602 Shortness of breath: Secondary | ICD-10-CM

## 2012-01-01 DIAGNOSIS — I4510 Unspecified right bundle-branch block: Secondary | ICD-10-CM

## 2012-01-01 DIAGNOSIS — I10 Essential (primary) hypertension: Secondary | ICD-10-CM

## 2012-01-01 DIAGNOSIS — E875 Hyperkalemia: Secondary | ICD-10-CM | POA: Diagnosis present

## 2012-01-01 DIAGNOSIS — I129 Hypertensive chronic kidney disease with stage 1 through stage 4 chronic kidney disease, or unspecified chronic kidney disease: Secondary | ICD-10-CM | POA: Diagnosis present

## 2012-01-01 DIAGNOSIS — D649 Anemia, unspecified: Secondary | ICD-10-CM

## 2012-01-01 DIAGNOSIS — I2510 Atherosclerotic heart disease of native coronary artery without angina pectoris: Secondary | ICD-10-CM | POA: Diagnosis present

## 2012-01-01 DIAGNOSIS — Z8542 Personal history of malignant neoplasm of other parts of uterus: Secondary | ICD-10-CM

## 2012-01-01 DIAGNOSIS — Z79899 Other long term (current) drug therapy: Secondary | ICD-10-CM

## 2012-01-01 LAB — BASIC METABOLIC PANEL
CO2: 27 mEq/L (ref 19–32)
GFR calc non Af Amer: 33 mL/min — ABNORMAL LOW (ref >90)
Glucose, Bld: 106 mg/dL — ABNORMAL HIGH (ref 70–99)
Potassium: 4.1 mEq/L (ref 3.5–5.1)
Sodium: 136 mEq/L (ref 135–145)

## 2012-01-01 LAB — CBC WITH DIFFERENTIAL/PLATELET
HCT: 27.5 % — ABNORMAL LOW (ref 36.0–46.0)
Hemoglobin: 8.7 g/dL — ABNORMAL LOW (ref 12.0–15.0)
Lymphocytes Relative: 11 % — ABNORMAL LOW (ref 12–46)
Lymphs Abs: 1.1 10*3/uL (ref 0.7–4.0)
Neutrophils Relative %: 84 % — ABNORMAL HIGH (ref 43–77)
Platelets: 311 10*3/uL (ref 150–400)

## 2012-01-01 LAB — PROTIME-INR: Prothrombin Time: 14.3 seconds (ref 11.6–15.2)

## 2012-01-01 LAB — PRO B NATRIURETIC PEPTIDE: Pro B Natriuretic peptide (BNP): 1194 pg/mL — ABNORMAL HIGH (ref 0–125)

## 2012-01-01 LAB — COMPREHENSIVE METABOLIC PANEL
Albumin: 3 g/dL — ABNORMAL LOW (ref 3.5–5.2)
Alkaline Phosphatase: 92 U/L (ref 39–117)
BUN: 18 mg/dL (ref 6–23)
Potassium: 4 mEq/L (ref 3.5–5.1)
Total Protein: 7.2 g/dL (ref 6.0–8.3)

## 2012-01-01 LAB — APTT: aPTT: 30 seconds (ref 24–37)

## 2012-01-01 MED ORDER — FUROSEMIDE 10 MG/ML IJ SOLN
40.0000 mg | Freq: Once | INTRAMUSCULAR | Status: AC
  Administered 2012-01-01: 40 mg via INTRAVENOUS
  Filled 2012-01-01: qty 4

## 2012-01-01 MED ORDER — VENLAFAXINE HCL 75 MG PO TABS
75.0000 mg | ORAL_TABLET | Freq: Every day | ORAL | Status: DC
  Administered 2012-01-02 – 2012-01-06 (×5): 75 mg via ORAL
  Filled 2012-01-01 (×5): qty 1

## 2012-01-01 MED ORDER — CLOPIDOGREL BISULFATE 75 MG PO TABS
75.0000 mg | ORAL_TABLET | Freq: Every day | ORAL | Status: DC
  Administered 2012-01-02 – 2012-01-06 (×5): 75 mg via ORAL
  Filled 2012-01-01 (×6): qty 1

## 2012-01-01 MED ORDER — SENNOSIDES-DOCUSATE SODIUM 8.6-50 MG PO TABS
1.0000 | ORAL_TABLET | Freq: Two times a day (BID) | ORAL | Status: DC
  Administered 2012-01-01 – 2012-01-06 (×10): 1 via ORAL
  Filled 2012-01-01 (×11): qty 1

## 2012-01-01 MED ORDER — ALLOPURINOL 100 MG PO TABS
100.0000 mg | ORAL_TABLET | Freq: Two times a day (BID) | ORAL | Status: DC
  Administered 2012-01-01 – 2012-01-02 (×3): 100 mg via ORAL
  Filled 2012-01-01 (×5): qty 1

## 2012-01-01 MED ORDER — DILTIAZEM HCL ER 240 MG PO CP24
240.0000 mg | ORAL_CAPSULE | Freq: Every day | ORAL | Status: DC
  Administered 2012-01-02 – 2012-01-06 (×5): 240 mg via ORAL
  Filled 2012-01-01 (×5): qty 1

## 2012-01-01 MED ORDER — SODIUM CHLORIDE 0.9 % IJ SOLN: 3.0000 mL | INTRAMUSCULAR | Status: DC | PRN

## 2012-01-01 MED ORDER — ALBUTEROL SULFATE HFA 108 (90 BASE) MCG/ACT IN AERS: 2.0000 | INHALATION_SPRAY | RESPIRATORY_TRACT | Status: DC | PRN

## 2012-01-01 MED ORDER — ASPIRIN EC 81 MG PO TBEC
81.0000 mg | DELAYED_RELEASE_TABLET | Freq: Every day | ORAL | Status: DC
  Administered 2012-01-01 – 2012-01-06 (×6): 81 mg via ORAL
  Filled 2012-01-01 (×6): qty 1

## 2012-01-01 MED ORDER — ARFORMOTEROL TARTRATE 15 MCG/2ML IN NEBU
15.0000 ug | INHALATION_SOLUTION | Freq: Two times a day (BID) | RESPIRATORY_TRACT | Status: DC
  Administered 2012-01-01 – 2012-01-03 (×4): 15 ug via RESPIRATORY_TRACT
  Filled 2012-01-01 (×6): qty 2

## 2012-01-01 MED ORDER — NITROGLYCERIN 0.4 MG SL SUBL: 0.4000 mg | SUBLINGUAL_TABLET | SUBLINGUAL | Status: DC | PRN

## 2012-01-01 MED ORDER — BUDESONIDE 0.5 MG/2ML IN SUSP
0.5000 mg | Freq: Two times a day (BID) | RESPIRATORY_TRACT | Status: DC
  Administered 2012-01-01 – 2012-01-06 (×10): 0.5 mg via RESPIRATORY_TRACT
  Filled 2012-01-01 (×12): qty 2

## 2012-01-01 MED ORDER — HEPARIN SODIUM (PORCINE) 5000 UNIT/ML IJ SOLN
5000.0000 [IU] | Freq: Three times a day (TID) | INTRAMUSCULAR | Status: DC
  Administered 2012-01-01 – 2012-01-06 (×14): 5000 [IU] via SUBCUTANEOUS
  Filled 2012-01-01 (×17): qty 1

## 2012-01-01 MED ORDER — TIOTROPIUM BROMIDE MONOHYDRATE 18 MCG IN CAPS
18.0000 ug | ORAL_CAPSULE | Freq: Every day | RESPIRATORY_TRACT | Status: DC
  Administered 2012-01-02 – 2012-01-06 (×5): 18 ug via RESPIRATORY_TRACT
  Filled 2012-01-01 (×2): qty 5

## 2012-01-01 MED ORDER — SODIUM CHLORIDE 0.9 % IV SOLN: 250.0000 mL | INTRAVENOUS | Status: DC | PRN

## 2012-01-01 MED ORDER — ACETAMINOPHEN 325 MG PO TABS: 650.0000 mg | ORAL_TABLET | ORAL | Status: DC | PRN

## 2012-01-01 MED ORDER — FERROUS SULFATE 325 (65 FE) MG PO TABS
325.0000 mg | ORAL_TABLET | Freq: Two times a day (BID) | ORAL | Status: DC
  Administered 2012-01-02 – 2012-01-06 (×9): 325 mg via ORAL
  Filled 2012-01-01 (×11): qty 1

## 2012-01-01 MED ORDER — ASPIRIN 81 MG PO TABS: 81.0000 mg | ORAL_TABLET | Freq: Every day | ORAL | Status: DC

## 2012-01-01 MED ORDER — SODIUM CHLORIDE 0.9 % IJ SOLN
3.0000 mL | Freq: Two times a day (BID) | INTRAMUSCULAR | Status: DC
  Administered 2012-01-01 – 2012-01-02 (×3): 3 mL via INTRAVENOUS

## 2012-01-01 MED ORDER — IRON 325 (65 FE) MG PO TABS: 325.0000 mg | ORAL_TABLET | Freq: Two times a day (BID) | ORAL | Status: DC

## 2012-01-01 MED ORDER — NEBIVOLOL HCL 5 MG PO TABS
5.0000 mg | ORAL_TABLET | Freq: Every day | ORAL | Status: DC
  Administered 2012-01-01 – 2012-01-06 (×6): 5 mg via ORAL
  Filled 2012-01-01 (×6): qty 1

## 2012-01-01 MED ORDER — ONDANSETRON HCL 4 MG/2ML IJ SOLN: 4.0000 mg | Freq: Four times a day (QID) | INTRAMUSCULAR | Status: DC | PRN

## 2012-01-01 MED ORDER — ATORVASTATIN CALCIUM 20 MG PO TABS
20.0000 mg | ORAL_TABLET | Freq: Every day | ORAL | Status: DC
  Administered 2012-01-01 – 2012-01-05 (×5): 20 mg via ORAL
  Filled 2012-01-01 (×6): qty 1

## 2012-01-01 NOTE — ED Notes (Signed)
Patient transported to X-ray 

## 2012-01-01 NOTE — ED Notes (Signed)
Patient resting with husband at bedside.

## 2012-01-01 NOTE — ED Notes (Signed)
Report called to [facility] on 47[address].

## 2012-01-01 NOTE — H&P (Signed)
Wanda Yang is an 71 y.o. female.   Chief Complaint: Shortness of Breath/Dyspnea on Exertion HPI: 71 yo woman with PMH of CAD s/p stenting of LCx and RCA, chronic tobacco use, COPD with last known FEV1 of 0.93, PVD, stage III CKD, prior endometrial cancer s/p resection who has had 2 treated flares of COPD in the last 2 months with prednisone last taken approximately 3 weeks ago. Over the last 4-5 days she has noticed more shortness of breath, decreased exercise tolerance and now some PND waking up her at night over the last 1-2 days. She notes no significant change in diet, no fever/chills/night sweats/nausea/vomiting/diarrhea or other illness/sick contacts. No change in medications. She does not recall significant weight changes although she has not been weighed here yet and her last weight at home ~ 194 lbs was several days ago. She weighs 198 lbs when she visits the physician with clothes on. She called in to her [facility] and wanted an appointment Monday but no visit until Tuesday so she preferred to come to the ER because of progressive SOB/DOE. She has stable 2 pillow orthopnea. She typically notices a good response to oral lasix 20 mg but this has changed slightly.   Past Medical History  Diagnosis Date  . CAD (coronary artery disease)   . Atherosclerosis of abdominal aorta   . On home oxygen therapy   . Tobacco abuse   . HTN (hypertension)   . COPD (chronic obstructive pulmonary disease)   . PVD (peripheral vascular disease)   . Anxiety   . GERD (gastroesophageal reflux disease)   . RBBB (right bundle branch block)   . Tachycardia   . High cholesterol   . Renal insufficiency   . Leukocytosis   . Endometrial carcinoma     Past Surgical History  Procedure Date  . Coronary angioplasty   . Abdominal hysterectomy   . Carotid stent   . Tonsillectomy   . Arm surgery     PLATES AND SCREWS  . Breast reduction surgery   . Femoral artery stent     Family History  Problem  Relation Age of Onset  . Heart attack Father   . Coronary artery disease Father   . Leukemia Mother   . Leukemia Brother    Social History:  reports that she has been smoking Cigarettes.  She has a 53 pack-year smoking history. She does not have any smokeless tobacco history on file. She reports that she drinks alcohol. She reports that she does not use illicit drugs.  Allergies:  Allergies  Allergen Reactions  . Uloric (Febuxostat)   . Vicodin (Hydrocodone-Acetaminophen)   . Zolpidem Tartrate   . Tetracycline Rash      Review of Systems  Constitutional: Negative for fever, chills, weight loss and diaphoresis.  HENT: Negative for hearing loss, neck pain and tinnitus.   Eyes: Negative for blurred vision, double vision and photophobia.  Respiratory: Positive for shortness of breath. Negative for cough, hemoptysis and wheezing.   Cardiovascular: Positive for leg swelling and PND. Negative for chest pain and palpitations.  Gastrointestinal: Negative for nausea, vomiting, abdominal pain and diarrhea.  Genitourinary: Negative for dysuria, urgency and frequency.  Musculoskeletal: Positive for joint pain. Negative for myalgias.  Skin: Negative for itching and rash.  Neurological: Negative for dizziness, tingling, tremors and headaches.  Endo/Heme/Allergies: Negative for polydipsia.  Psychiatric/Behavioral: Positive for depression. Negative for suicidal ideas and hallucinations.       Continues to smoke tobacco  All  other systems reviewed and are negative.    Blood pressure 132/70, pulse 77, temperature 98.2 F (36.8 C), temperature source Oral, resp. rate 20, height 5' 8.5" (1.74 m), weight 87.998 kg (194 lb), SpO2 99.00%. Physical Exam  Nursing note and vitals reviewed. Constitutional: She is oriented to person, place, and time. She appears well-developed and well-nourished. No distress.  HENT:  Head: Normocephalic and atraumatic.  Mouth/Throat: Oropharynx is clear and moist. No  oropharyngeal exudate.       Nose normal shape, no drainage but skin pigmented diffusely  Eyes: Conjunctivae and EOM are normal. Pupils are equal, round, and reactive to light. No scleral icterus.  Neck: Normal range of motion. Neck supple. JVD present. No tracheal deviation present.       JVD 8 cm with +HJR  Cardiovascular: Normal rate, regular rhythm, normal heart sounds and intact distal pulses.  Exam reveals no gallop.   No murmur heard. Respiratory: Effort normal. No respiratory distress. She has no wheezes. She has no rales. She exhibits no tenderness.       Moderately poor air movement but clear, no wheezes or rales  GI: Soft. Bowel sounds are normal. She exhibits no distension. There is no tenderness. There is no rebound.  Musculoskeletal: Normal range of motion. She exhibits edema. She exhibits no tenderness.       Trace pitting edema, + nonpitting edema. Distal pulses 1 - present but not strong  Neurological: She is alert and oriented to person, place, and time. She displays normal reflexes. No cranial nerve deficit. Coordination normal.  Skin: Skin is warm and dry. No rash noted. She is not diaphoretic. No erythema.  Psychiatric: She has a normal mood and affect. Her behavior is normal.  Labs reviewed; K 4.1, Cr 1.54, na 136, Troponin negative, BNP 1200 from 200s, h/h 8.7/27, wbc 10.5, plt 351 PFTs from '07, FEV1 0.93, FVC 1.73, DLCO 8.3, 30%, small airways disease   EKG reviewed and compared with previous; fairly unchanged, RBBB, ST changes inferiorly Chest x-ray reviewed; cephalization, bibasilar opacities TTE reviewed from '09 with normal LVEF  Problem List Shortness of Breath/Dyspnea on Exertion/Decreased Exercise tolerance Lower extremity swelling Elevated BNP Elevated creatinine/Chronic Kidney Disease/insufficiency ? Right heart failure related to COPD/Pulmonary Hypertension Progressive Chronic Respiratory failure/COPD Prior known Coronary Artery Disease Anemia Tobacco  abuse Assessment/Plan 71 yo woman with PMH as above here with progressive SOB with multiple recent treatments for COPD flares who has elevated BNP, moderately elevated JVP, continued tobacco use, new PND with concern for development of worsening heart failure. Differential diagnosis is right heart failure/pulmonary hypertension, new systolic heart failure, recurrent COPD exacerbation, ACS and less likely liver disease or PE (no tachycardia). I favor a diagnosis of progressive heart failure (right-sided) with possible pulmonary hypertension component/etiology. However, chronic respiratory failure 2/2 COPD and continued smoking could be the driving force although exam not consistent with isolated pulmonary issue.  - on asa 81 mg - will trial 40 mg IV lasix, strict ins/outs; if responds then redose tomorrow as appropriate  - given patient is warm/wet, will continue beta blocker (dosed tomorrow AM)  - obtain repeat TTE in AM, order placed now - if signs of significant RVSP then further evaluation - smoking cessation counseling provided but patient prefers to continue smoking given her significant limitations in daily activity. She tells me she knows this is drastically shortening her lifespan.  - continuing home therapy for COPD(pulmicort, arformoterol, spiriva) but deferring initiation of steroids/antibiotics to assess response to focused heart  failure therapy; if no response, then would move towards COPD therapy - continue oxygen therapy at 3L - home dosing - heparin 5k tid for ppx, watch daily cbc  Dylan Monforte 01/01/2012, 6:45 PM

## 2012-01-01 NOTE — ED Notes (Signed)
Patient placed on her home oxygen at 3 liters and was ambulated to the restroom and her Sats dropped to 88%.

## 2012-01-01 NOTE — ED Notes (Signed)
Patient reports she has noticed her heart has been racing and she has increased sob with any activity.  She states her arm and legs get weak.  She also has experienced warm feeling in her arms and legs at times.  Denies any pain at this time

## 2012-01-01 NOTE — ED Provider Notes (Signed)
History     CSN: [id number]  Arrival date & time 01/01/12  1328   First MD Initiated Contact with Patient 01/01/12 [phone]      Chief Complaint  Patient presents with  . Shortness of Breath  . Weakness    (Consider location/radiation/quality/duration/timing/severity/associated sxs/prior treatment) Patient is a 71 y.o. female presenting with shortness of breath and weakness. The history is provided by the patient.  Shortness of Breath  The current episode started 3 to 5 days ago. The onset was gradual. The problem occurs frequently. The problem has been unchanged. The problem is moderate. The symptoms are relieved by rest. The symptoms are aggravated by activity. Associated symptoms include shortness of breath. Pertinent negatives include no chest pain, no chest pressure, no orthopnea, no fever, no rhinorrhea, no sore throat, no stridor, no cough and no wheezing. There was no intake of a foreign body. She was not exposed to toxic fumes. She has not inhaled smoke recently. She has been behaving normally. Urine output has been normal. The last void occurred less than 6 hours ago. There were no sick contacts. She has received no recent medical care.  Weakness Primary symptoms do not include headaches, seizures, dizziness, fever, nausea or vomiting.  Additional symptoms include weakness. Additional symptoms do not include neck stiffness.    Past Medical History  Diagnosis Date  . CAD (coronary artery disease)   . Atherosclerosis of abdominal aorta   . On home oxygen therapy   . Tobacco abuse   . HTN (hypertension)   . COPD (chronic obstructive pulmonary disease)   . PVD (peripheral vascular disease)   . Anxiety   . GERD (gastroesophageal reflux disease)   . RBBB (right bundle branch block)   . Tachycardia   . High cholesterol   . Renal insufficiency   . Leukocytosis   . Endometrial carcinoma     Past Surgical History  Procedure Date  . Coronary angioplasty   . Abdominal  hysterectomy   . Carotid stent   . Tonsillectomy   . Arm surgery     PLATES AND SCREWS  . Breast reduction surgery   . Femoral artery stent     Family History  Problem Relation Age of Onset  . Heart attack Father   . Coronary artery disease Father   . Leukemia Mother   . Leukemia Brother     History  Substance Use Topics  . Smoking status: Current Everyday Smoker -- 1.0 packs/day for 53 years    Types: Cigarettes  . Smokeless tobacco: Not on file   Comment: currently smoking 3/4 ppd.   . Alcohol Use: Yes     occassional    OB History    Grav Para Term Preterm Abortions TAB SAB Ect Mult Living                  Review of Systems  Constitutional: Negative for fever, chills, diaphoresis and fatigue.  HENT: Negative for ear pain, congestion, sore throat, facial swelling, rhinorrhea, mouth sores, trouble swallowing, neck pain and neck stiffness.   Eyes: Negative.   Respiratory: Positive for shortness of breath. Negative for apnea, cough, chest tightness, wheezing and stridor.   Cardiovascular: Negative for chest pain, palpitations, orthopnea and leg swelling.  Gastrointestinal: Negative for nausea, vomiting, abdominal pain, diarrhea and abdominal distention.  Genitourinary: Negative for hematuria, flank pain, vaginal discharge, difficulty urinating and menstrual problem.  Musculoskeletal: Negative for back pain and gait problem.  Skin: Negative for rash and wound.  Neurological: Positive for weakness. Negative for dizziness, tremors, seizures, syncope, facial asymmetry, numbness and headaches.  Psychiatric/Behavioral: Negative.   All other systems reviewed and are negative.    Allergies  Uloric; Vicodin; Zolpidem tartrate; and Tetracycline  Home Medications   Current Outpatient Rx  Name Route Sig Dispense Refill  . ALBUTEROL SULFATE HFA 108 (90 BASE) MCG/ACT IN AERS Inhalation Inhale 2 puffs into the lungs every 4 (four) hours as needed. 1 Inhaler 5  . ALLOPURINOL  100 MG PO TABS Oral Take 100 mg by mouth 2 (two) times daily.      . ARFORMOTEROL TARTRATE 15 MCG/2ML IN NEBU Nebulization Take 2 mLs (15 mcg total) by nebulization 2 (two) times daily. 360 mL 3  . ASPIRIN 81 MG PO TABS Oral Take 81 mg by mouth daily.      . BUDESONIDE 0.5 MG/2ML IN SUSP Nebulization Take 2 mLs (0.5 mg total) by nebulization 2 (two) times daily. 360 mL 3  . CASANTHRANOL-DOCUSATE SODIUM 30-100 MG PO CAPS Oral Take 1 capsule by mouth as needed.     . CLOPIDOGREL BISULFATE 75 MG PO TABS  TAKE 1 TABLET DAILY 90 tablet 3  . DILTIAZEM HCL ER 240 MG PO CP24 Oral Take 1 capsule (240 mg total) by mouth daily. 90 capsule 3  . FUROSEMIDE 40 MG PO TABS Oral Take 1 tablet (40 mg total) by mouth daily. 90 tablet 3  . IRON PO Oral Take 65 mg by mouth daily.      . NEBIVOLOL HCL 10 MG PO TABS Oral Take 0.5 tablets (5 mg total) by mouth daily. 90 tablet 2  . NITROGLYCERIN 0.4 MG SL SUBL Sublingual Place 0.4 mg under the tongue every 5 (five) minutes as needed.      Marland Kitchen ROSUVASTATIN CALCIUM 10 MG PO TABS Oral Take 1 tablet (10 mg total) by mouth daily. 90 tablet 3  . SPIRIVA HANDIHALER 18 MCG IN CAPS  INHALE THE CONTENTS OF 1   CAPSULE IN HANDIHALER DAILY 90 capsule 3  . VENLAFAXINE HCL 75 MG PO TABS Oral Take 75 mg by mouth daily.       BP 159/69  Pulse 70  Temp 98.2 F (36.8 C) (Oral)  Resp 24  Ht 5' 8.5" (1.74 m)  Wt 194 lb (87.998 kg)  BMI 29.07 kg/m2  SpO2 99%  Physical Exam  Nursing note and vitals reviewed. Constitutional: She is oriented to person, place, and time. She appears well-developed and well-nourished. No distress.  HENT:  Head: Normocephalic and atraumatic.  Right Ear: External ear normal.  Left Ear: External ear normal.  Nose: Nose normal.  Mouth/Throat: Oropharynx is clear and moist. No oropharyngeal exudate.  Eyes: Conjunctivae and EOM are normal. Pupils are equal, round, and reactive to light. Right eye exhibits no discharge. Left eye exhibits no discharge.    Neck: Normal range of motion. Neck supple. No JVD present. No tracheal deviation present. No thyromegaly present.  Cardiovascular: Normal rate, regular rhythm, normal heart sounds and intact distal pulses.  Exam reveals no gallop and no friction rub.   No murmur heard. Pulmonary/Chest: Effort normal and breath sounds normal. No respiratory distress. She has no wheezes. She has no rales. She exhibits no tenderness.  Abdominal: Soft. Bowel sounds are normal. She exhibits no distension. There is no tenderness. There is no rebound and no guarding.  Musculoskeletal: Normal range of motion.  Lymphadenopathy:    She has no cervical adenopathy.  Neurological: She is alert and oriented to  person, place, and time. No cranial nerve deficit. Coordination normal.  Skin: Skin is warm. No rash noted. She is not diaphoretic.  Psychiatric: She has a normal mood and affect. Her behavior is normal. Judgment and thought content normal.    ED Course  Procedures (including critical care time)  Labs Reviewed  CBC WITH DIFFERENTIAL - Abnormal; Notable for the following:    RBC 2.99 (*)     Hemoglobin 8.7 (*)     HCT 27.5 (*)     RDW 16.8 (*)     Neutrophils Relative 84 (*)     Neutro Abs 8.8 (*)     Lymphocytes Relative 11 (*)     All other components within normal limits  BASIC METABOLIC PANEL - Abnormal; Notable for the following:    Glucose, Bld 106 (*)     Creatinine, Ser 1.54 (*)     GFR calc non Af Amer 33 (*)     GFR calc Af Amer 38 (*)     All other components within normal limits  PRO B NATRIURETIC PEPTIDE - Abnormal; Notable for the following:    Pro B Natriuretic peptide (BNP) 1194.0 (*)     All other components within normal limits  TROPONIN I  URINALYSIS, ROUTINE W REFLEX MICROSCOPIC   Dg Chest 2 View  01/01/2012  *RADIOLOGY REPORT*  Clinical Data: Shortness of breath  CHEST - 2 VIEW  Comparison: 04/14/2008  Findings: Cardiomediastinal silhouette is stable.  No acute infiltrate or  pleural effusion.  No pulmonary edema.  Stable hyperinflation and streaky bilateral basilar scarring. Mild degenerative changes thoracic spine.  IMPRESSION: No active disease.  Stable hyperinflation and bilateral basilar scarring.  Original Report Authenticated By: Natasha Mead, M.D.     1. CHF (congestive heart failure)       MDM   71 year old female patient with past medical history of coronary artery disease hypertension COPD on 3 L home oxygen has had 4-5 days of worsening dyspnea on exertion. Patient without fever cough nausea vomiting or chest pain. Doubt pulmonary embolism as patient is well score is 0. Patient also without chest pain. Patient with worsening swelling of legs. Elevated BNP consistent with heart failure exacerbation, negative troponin after 5 days of symptoms is reassuring that this is not an N. STEMI. Patient with no evidence of pneumonia. Patient likely having heart failure exacerbation  Results for orders placed during the hospital encounter of 01/01/12  CBC WITH DIFFERENTIAL      Component Value Range   WBC 10.5  4.0 - 10.5 K/uL   RBC 2.99 (*) 3.87 - 5.11 MIL/uL   Hemoglobin 8.7 (*) 12.0 - 15.0 g/dL   HCT 04.5 (*) 40.9 - 81.1 %   MCV 92.0  78.0 - 100.0 fL   MCH 29.1  26.0 - 34.0 pg   MCHC 31.6  30.0 - 36.0 g/dL   RDW 91.4 (*) 78.2 - 95.6 %   Platelets 311  150 - 400 K/uL   Neutrophils Relative 84 (*) 43 - 77 %   Neutro Abs 8.8 (*) 1.7 - 7.7 K/uL   Lymphocytes Relative 11 (*) 12 - 46 %   Lymphs Abs 1.1  0.7 - 4.0 K/uL   Monocytes Relative 5  3 - 12 %   Monocytes Absolute 0.5  0.1 - 1.0 K/uL   Eosinophils Relative 1  0 - 5 %   Eosinophils Absolute 0.1  0.0 - 0.7 K/uL   Basophils Relative 0  0 - 1 %  Basophils Absolute 0.0  0.0 - 0.1 K/uL  BASIC METABOLIC PANEL      Component Value Range   Sodium 136  135 - 145 mEq/L   Potassium 4.1  3.5 - 5.1 mEq/L   Chloride 98  96 - 112 mEq/L   CO2 27  19 - 32 mEq/L   Glucose, Bld 106 (*) 70 - 99 mg/dL   BUN 18  6 -  23 mg/dL   Creatinine, Ser 1.61 (*) 0.50 - 1.10 mg/dL   Calcium 9.3  8.4 - 09.6 mg/dL   GFR calc non Af Amer 33 (*) >90 mL/min   GFR calc Af Amer 38 (*) >90 mL/min  TROPONIN I      Component Value Range   Troponin I <0.30  <0.30 ng/mL  PRO B NATRIURETIC PEPTIDE      Component Value Range   Pro B Natriuretic peptide (BNP) 1194.0 (*) 0 - 125 pg/mL       DG Chest 2 View (Final result)   Result time:01/01/12 1430    Final result by Rad Results In Interface (01/01/12 14:30:24)    Narrative:   *RADIOLOGY REPORT*  Clinical Data: Shortness of breath  CHEST - 2 VIEW  Comparison: 04/14/2008  Findings: Cardiomediastinal silhouette is stable. No acute infiltrate or pleural effusion. No pulmonary edema. Stable hyperinflation and streaky bilateral basilar scarring. Mild degenerative changes thoracic spine.  IMPRESSION: No active disease. Stable hyperinflation and bilateral basilar scarring.  Original Report Authenticated By: Natasha Mead, M.D.    Date: 01/01/2012  Rate: 77  Rhythm: normal sinus rhythm  QRS Axis: normal  Intervals: normal  ST/T Wave abnormalities: normal  Conduction Disutrbances:right bundle branch block  Narrative Interpretation:   Old EKG Reviewed: unchanged  Patient will be admitted to the cardiology service for management of her heart failure exacerbation  Case discussed with Macario Carls, MD 01/01/12 1721

## 2012-01-01 NOTE — ED Notes (Signed)
Husband remains at bedside

## 2012-01-01 NOTE — ED Notes (Signed)
Returned to room and placed on all monitors

## 2012-01-02 ENCOUNTER — Telehealth: Payer: Self-pay | Admitting: Cardiovascular Disease

## 2012-01-02 ENCOUNTER — Inpatient Hospital Stay (HOSPITAL_COMMUNITY): Payer: Federal, State, Local not specified - PPO

## 2012-01-02 DIAGNOSIS — I517 Cardiomegaly: Secondary | ICD-10-CM

## 2012-01-02 DIAGNOSIS — F17200 Nicotine dependence, unspecified, uncomplicated: Secondary | ICD-10-CM

## 2012-01-02 DIAGNOSIS — I509 Heart failure, unspecified: Secondary | ICD-10-CM

## 2012-01-02 DIAGNOSIS — J449 Chronic obstructive pulmonary disease, unspecified: Secondary | ICD-10-CM

## 2012-01-02 DIAGNOSIS — I10 Essential (primary) hypertension: Secondary | ICD-10-CM

## 2012-01-02 LAB — URINALYSIS, ROUTINE W REFLEX MICROSCOPIC
Ketones, ur: NEGATIVE mg/dL
Leukocytes, UA: NEGATIVE
Nitrite: NEGATIVE
Specific Gravity, Urine: 1.007 (ref 1.005–1.030)
Urobilinogen, UA: 0.2 mg/dL (ref 0.0–1.0)
pH: 7 (ref 5.0–8.0)

## 2012-01-02 LAB — LIPID PANEL: Cholesterol: 145 mg/dL (ref 0–200)

## 2012-01-02 LAB — CK TOTAL AND CKMB (NOT AT ARMC)
CK, MB: 1.8 ng/mL (ref 0.3–4.0)
Total CK: 45 U/L (ref 7–177)

## 2012-01-02 LAB — CBC WITH DIFFERENTIAL/PLATELET
Basophils Absolute: 0 10*3/uL (ref 0.0–0.1)
Basophils Relative: 0 % (ref 0–1)
Eosinophils Absolute: 0.1 10*3/uL (ref 0.0–0.7)
Eosinophils Relative: 1 % (ref 0–5)
MCH: 28.6 pg (ref 26.0–34.0)
MCHC: 31.1 g/dL (ref 30.0–36.0)
MCV: 91.8 fL (ref 78.0–100.0)
Platelets: 286 10*3/uL (ref 150–400)
RDW: 16.6 % — ABNORMAL HIGH (ref 11.5–15.5)
WBC: 9.9 10*3/uL (ref 4.0–10.5)

## 2012-01-02 LAB — PRO B NATRIURETIC PEPTIDE: Pro B Natriuretic peptide (BNP): 885.3 pg/mL — ABNORMAL HIGH (ref 0–125)

## 2012-01-02 LAB — BASIC METABOLIC PANEL
Calcium: 9.2 mg/dL (ref 8.4–10.5)
GFR calc non Af Amer: 32 mL/min — ABNORMAL LOW (ref >90)
Glucose, Bld: 102 mg/dL — ABNORMAL HIGH (ref 70–99)
Sodium: 141 mEq/L (ref 135–145)

## 2012-01-02 LAB — TSH: TSH: 2.507 u[IU]/mL (ref 0.350–4.500)

## 2012-01-02 LAB — HEMOGLOBIN A1C: Hgb A1c MFr Bld: 5.9 % — ABNORMAL HIGH (ref <5.7)

## 2012-01-02 LAB — GLUCOSE, CAPILLARY

## 2012-01-02 MED ORDER — ALBUTEROL SULFATE (5 MG/ML) 0.5% IN NEBU
2.5000 mg | INHALATION_SOLUTION | RESPIRATORY_TRACT | Status: DC
  Administered 2012-01-02: 2.5 mg via RESPIRATORY_TRACT
  Filled 2012-01-02 (×2): qty 0.5

## 2012-01-02 MED ORDER — NICOTINE 21 MG/24HR TD PT24
21.0000 mg | MEDICATED_PATCH | TRANSDERMAL | Status: DC
  Administered 2012-01-02 – 2012-01-06 (×5): 21 mg via TRANSDERMAL
  Filled 2012-01-02 (×5): qty 1

## 2012-01-02 MED ORDER — ALBUTEROL SULFATE (5 MG/ML) 0.5% IN NEBU: 2.5000 mg | INHALATION_SOLUTION | RESPIRATORY_TRACT | Status: DC | PRN

## 2012-01-02 MED ORDER — PANTOPRAZOLE SODIUM 40 MG PO TBEC
40.0000 mg | DELAYED_RELEASE_TABLET | Freq: Every day | ORAL | Status: DC
  Administered 2012-01-02 – 2012-01-05 (×4): 40 mg via ORAL
  Filled 2012-01-02 (×5): qty 1

## 2012-01-02 MED ORDER — IPRATROPIUM BROMIDE 0.02 % IN SOLN: 0.5000 mg | RESPIRATORY_TRACT | Status: DC

## 2012-01-02 MED ORDER — PREDNISONE 20 MG PO TABS
20.0000 mg | ORAL_TABLET | Freq: Every day | ORAL | Status: DC
  Administered 2012-01-02 – 2012-01-06 (×5): 20 mg via ORAL
  Filled 2012-01-02 (×7): qty 1

## 2012-01-02 MED ORDER — ALBUTEROL SULFATE (5 MG/ML) 0.5% IN NEBU
2.5000 mg | INHALATION_SOLUTION | Freq: Once | RESPIRATORY_TRACT | Status: AC
  Administered 2012-01-02: 2.5 mg via RESPIRATORY_TRACT

## 2012-01-02 NOTE — ED Provider Notes (Signed)
I have personally seen and examined the patient.  I have discussed the plan of care with the resident.  I have reviewed the documentation on PMH/FH/Soc. History.  I have reviewed the documentation of the resident and agree.  I have reviewed and agree with the ECG interpretation(s) documented by the resident.   Pt symptomatic with CHF, required admission for diuresis/monitoring  Joya Gaskins, MD 01/02/12 1605

## 2012-01-02 NOTE — Telephone Encounter (Signed)
LEFT MSG THAT I WILL CALL THEM TOMORROW.

## 2012-01-02 NOTE — Progress Notes (Signed)
Pt states that she doesn't feel like she needs Albuterol every 4hours.  She would rather have them PRN and just call when she needs them for her shortness of breath.  RT will continue to monitor.

## 2012-01-02 NOTE — Telephone Encounter (Signed)
New msg Pt's husband called back about bystolic rx. He said [facility] is charging him high copay for 10 mg rx.  Please call back to discuss

## 2012-01-02 NOTE — Progress Notes (Signed)
PFT done. Unconfirmed copy placed in shadow chart.  Raed Schalk Ferguson RRT, RCP 01/02/2012 12:34 PM  

## 2012-01-02 NOTE — Consult Note (Signed)
[facility]   Patient Demographics  Wanda Yang, is a 71 y.o. female  CSN: [id number]  MRN: [id number]  DOB - [birth year]  Admit date - 01/01/2012  Admitting Physician [name], MD  Outpatient Primary MD for the patient is Henri Medal, MD  LOS - 1   Consult requested in the Hospital by [name], MD, On 01/02/2012    Reason for consult Evaluation Of COPD.      Assessment & Plan  1. Principal Problem:  *CHF (congestive heart failure) Active Problems:  HYPERLIPIDEMIA  HYPERTENSION  CAD (coronary artery disease)  Tobacco abuse  COPD (chronic obstructive pulmonary disease)  Renal insufficiency  1. Dyspnea-this is most likely due to the progression of her COPD, as patient still actively smoking, multiple episodes of recent COPD exacerbation, and she is around her baseline oxygen requirement, and even though she does not appear to be in significant COPD exacerbation,, will start her on by mouth prednisone 40 mg daily, where she'll be able to be discharged on Medrol pack when cleared by primary team,  2. Anemia. Is been followed by patient's nephrology and primary care physician, continue on iron supplements, will check anemia panel in am, Hemoccult is ordered by primary team, patient is scheduled for colonoscopy in September.  3. Tobacco abuse. Patient was counseled, will start a nicotine patch.  4. Coronary artery disease. No chest pain, continue aspirin and  5. Hypertension. Controlled on Cardizem  6. Hyperlipidemia. Continue statin    DVT Prophylaxis -[place] Heparin   Please review my Orders  Family Communication - Plan of care including tests being ordered have been discussed with the patient  who indicate understanding and agree with the plan  Thank you for the consult, we will follow the patient with you in the Hospital.   With History of -  Principal Problem:  *CHF (congestive heart  failure) Active Problems:  HYPERLIPIDEMIA  HYPERTENSION  CAD (coronary artery disease)  Tobacco abuse  COPD (chronic obstructive pulmonary disease)  Renal insufficiency   Past Medical History  Diagnosis Date  . CAD (coronary artery disease)   . Atherosclerosis of abdominal aorta   . On home oxygen therapy   . Tobacco abuse   . HTN (hypertension)   . COPD (chronic obstructive pulmonary disease)   . PVD (peripheral vascular disease)   . Anxiety   . GERD (gastroesophageal reflux disease)   . RBBB (right bundle branch block)   . Tachycardia   . High cholesterol   . Renal insufficiency   . Leukocytosis   . Endometrial carcinoma      Past Surgical History  Procedure Date  . Coronary angioplasty   . Abdominal hysterectomy   . Carotid stent   . Tonsillectomy   . Arm surgery     PLATES AND SCREWS  . Breast reduction surgery   . Femoral artery stent     Past Surgical History  Procedure Date  . Coronary angioplasty   . Abdominal hysterectomy   . Carotid stent   . Tonsillectomy   . Arm surgery     PLATES AND SCREWS  . Breast reduction surgery   . Femoral artery stent     HPI:-  Wanda Yang [id number] is a 71 y.o. female, with contrast medical history of coronary artery disease, tobacco use, COPD multiple episodes of COPD exacerbation last before 2 weeks where she was tried on by mouth prednisone, presents with shortness of breath over the last 2 weeks, patient reports her  symptoms started last episodes of COPD exacerbation, reports she is on baseline 3 L oxygen at home 24/7, reports a progression of her dyspnea mainly upon exertion, denies any orthopnea, denies any worsening of her cough or productive phlegm, reports mild lower extremity edema upon presentation, patient and her BNP was elevated upon admission at 1194, patient was initially admitted for CHF exacerbation, was started on diuresis, which didn't have much significant edema, or JVD, and a chest  x-ray showed no COPD picture than volume overload, medical consult was called for management of her COPD.      Review of Systems    In addition to the HPI above,  No Fever-chills, No Headache, No changes with Vision or hearing, No problems swallowing food or Liquids, No Chest pain, no worsening of her baseline Cough, no productive sputum complaints of Shortness of Breath mainly exertional. No Abdominal pain, No Nausea or Vommitting, Bowel movements are regular, No Blood in stool or Urine, No dysuria, No new skin rashes or bruises, No new joints pains-aches,  No new weakness, tingling, numbness in any extremity, very mild pitting edema No recent weight gain or loss, No polyuria, polydypsia or polyphagia, No significant Mental Stressors.  A full 10 point review of systems was obtained except as dictated above all other review of systems are negative.     Social History History  Substance Use Topics  . Smoking status: Current Everyday Smoker -- 1.0 packs/day for 53 years    Types: Cigarettes  . Smokeless tobacco: Not on file   Comment: currently smoking 3/4 ppd.   . Alcohol Use: Yes     occassional     Family History Family History  Problem Relation Age of Onset  . Heart attack Father   . Coronary artery disease Father   . Leukemia Mother   . Leukemia Brother     Prior to Admission medications   Medication Sig Start Date End Date Taking? Authorizing Provider  albuterol (PROVENTIL HFA;VENTOLIN HFA) 108 (90 BASE) MCG/ACT inhaler Inhale 2 puffs into the lungs every 4 (four) hours as needed. 09/28/11  Yes Barbaraann Share, MD  allopurinol (ZYLOPRIM) 100 MG tablet Take 100 mg by mouth 2 (two) times daily.     Yes Historical Provider, MD  arformoterol (BROVANA) 15 MCG/2ML NEBU Take 2 mLs (15 mcg total) by nebulization 2 (two) times daily. 02/03/11  Yes Barbaraann Share, MD  aspirin 81 MG tablet Take 81 mg by mouth daily.     Yes Historical Provider, MD  budesonide (PULMICORT)  0.5 MG/2ML nebulizer solution Take 2 mLs (0.5 mg total) by nebulization 2 (two) times daily. 02/03/11  Yes Barbaraann Share, MD  Casanthranol-Docusate Sodium 30-100 MG CAPS Take 1 capsule by mouth as needed.    Yes Historical Provider, MD  clopidogrel (PLAVIX) 75 MG tablet TAKE 1 TABLET DAILY 08/02/11  Yes [name], MD  diltiazem (DILACOR XR) 240 MG 24 hr capsule Take 1 capsule (240 mg total) by mouth daily. 10/03/11  Yes [name], MD  furosemide (LASIX) 40 MG tablet Take 1 tablet (40 mg total) by mouth daily. 10/03/11  Yes [name], MD  IRON PO Take 65 mg by mouth daily.     Yes Historical Provider, MD  nebivolol (BYSTOLIC) 10 MG tablet Take 0.5 tablets (5 mg total) by mouth daily. 12/28/11  Yes [name], MD  nitroGLYCERIN (NITROSTAT) 0.4 MG SL tablet Place 0.4 mg under the tongue every 5 (five) minutes as  needed.     Yes Historical Provider, MD  rosuvastatin (CRESTOR) 10 MG tablet Take 1 tablet (10 mg total) by mouth daily. 11/16/11  Yes [name], MD  SPIRIVA HANDIHALER 18 MCG inhalation capsule INHALE THE CONTENTS OF 1   CAPSULE IN HANDIHALER DAILY 08/08/11  Yes Barbaraann Share, MD  venlafaxine (EFFEXOR) 75 MG tablet Take 75 mg by mouth daily.    Yes Historical Provider, MD    Allergies  Allergen Reactions  . Uloric (Febuxostat)   . Vicodin (Hydrocodone-Acetaminophen)   . Zolpidem Tartrate   . Tetracycline Rash     Physical Exam   Vitals  Blood pressure 151/69, pulse 74, temperature 98.1 F (36.7 C), temperature source Oral, resp. rate 18, height 5\' 8"  (1.727 m), weight 85.1 kg (187 lb 9.8 oz), SpO2 93.00%.   1. General elderly female lying in bed in NAD,   2. Normal affect and insight, Not Suicidal or Homicidal, Awake Alert, Oriented X 3.  3. No F.N deficits, ALL C.Nerves Intact, Strength 5/5 all 4 extremities, Sensation intact all   4 extremities, Plantars down going.  4. Ears and Eyes appear Normal, Conjunctivae clear, PERRLA. Moist Oral  Mucosa.  5. Supple Neck, No JVD, No cervical lymphadenopathy appriciated, No Carotid Bruits.  6. Symmetrical Chest wall movement, fair air movement bilaterally, no significant wheezing. 7. RRR, No Gallops, Rubs or Murmurs, No Parasternal Heave.  8. Positive Bowel Sounds, Abdomen Soft, Non tender, No organomegaly appriciated, No rebound -guarding or rigidity.  9.  No Cyanosis, Normal Skin Turgor, No Skin Rash or Bruise.  10. Good muscle tone,  joints appear normal , no effusions, Normal ROM.  11. No Palpable Lymph Nodes in Neck or Axillae   Data Review  CBC  Lab 01/02/12 0500 01/01/12 1424  WBC 9.9 10.5  HGB 8.0* 8.7*  HCT 25.7* 27.5*  PLT 286 311  MCV 91.8 92.0  MCH 28.6 29.1  MCHC 31.1 31.6  RDW 16.6* 16.8*  LYMPHSABS 1.4 1.1  MONOABS 0.5 0.5  EOSABS 0.1 0.1  BASOSABS 0.0 0.0  BANDABS -- --    Chemistries   Lab 01/02/12 0500 01/01/12 2012 01/01/12 1424  NA 141 137 136  K 3.9 4.0 4.1  CL 101 99 98  CO2 29 28 27   GLUCOSE 102* 98 106*  BUN 17 18 18   CREATININE 1.57* 1.50* 1.54*  CALCIUM 9.2 9.4 9.3  MG -- 2.1 --  AST -- 14 --  ALT -- 11 --  ALKPHOS -- 92 --  BILITOT -- 0.2* --   ------------------------------------------------------------------------------------------------------------------ estimated creatinine clearance is 38.1 ml/min (by C-G formula based on Cr of 1.57). ------------------------------------------------------------------------------------------------------------------ No results found for this basename: HGBA1C:2 in the last 72 hours ------------------------------------------------------------------------------------------------------------------  Basename 01/02/12 0500  CHOL 145  HDL 38*  LDLCALC 64  TRIG 161*  CHOLHDL 3.8  LDLDIRECT --   ------------------------------------------------------------------------------------------------------------------  Alvira Philips 01/01/12 2012  TSH 2.507  T4TOTAL --  T3FREE --  THYROIDAB --    ------------------------------------------------------------------------------------------------------------------ No results found for this basename: VITAMINB12:2,FOLATE:2,FERRITIN:2,TIBC:2,IRON:2,RETICCTPCT:2 in the last 72 hours  Coagulation profile  Lab 01/01/12 2012  INR 1.09  PROTIME --    No results found for this basename: DDIMER:2 in the last 72 hours  Cardiac Enzymes  Lab 01/02/12 0655 01/01/12 1423  CKMB 1.8 --  TROPONINI <0.30 <0.30  MYOGLOBIN -- --   ------------------------------------------------------------------------------------------------------------------ No components found with this basename: POCBNP:3  ------------------------------------------------------------------------------------------------------------------- Micro Results No results found for this or any previous visit (from the past  240 hour(s)). ------------------------------------------------------------------------------------------------------------------ Radiology Reports Dg Chest 2 View  01/01/2012  *RADIOLOGY REPORT*  Clinical Data: Shortness of breath  CHEST - 2 VIEW  Comparison: 04/14/2008  Findings: Cardiomediastinal silhouette is stable.  No acute infiltrate or pleural effusion.  No pulmonary edema.  Stable hyperinflation and streaky bilateral basilar scarring. Mild degenerative changes thoracic spine.  IMPRESSION: No active disease.  Stable hyperinflation and bilateral basilar scarring.  Original Report Authenticated By: Natasha Mead, M.D.    Scheduled Meds:   . ipratropium  0.5 mg Nebulization Q4H   And  . albuterol  2.5 mg Nebulization Q4H  . allopurinol  100 mg Oral BID  . arformoterol  15 mcg Nebulization BID  . aspirin EC  81 mg Oral Daily  . atorvastatin  20 mg Oral q1800  . budesonide  0.5 mg Nebulization BID  . clopidogrel  75 mg Oral Q breakfast  . diltiazem  240 mg Oral Daily  . ferrous sulfate  325 mg Oral BID WC  . furosemide  40 mg Intravenous Once  . heparin   5,000 Units Subcutaneous Q8H  . nebivolol  5 mg Oral Daily  . predniSONE  20 mg Oral Q breakfast  . senna-docusate  1 tablet Oral BID  . sodium chloride  3 mL Intravenous Q12H  . tiotropium  18 mcg Inhalation Daily  . venlafaxine  75 mg Oral Daily  . DISCONTD: aspirin  81 mg Oral Daily  . DISCONTD: Iron  325 mg Oral BID   Continuous Infusions:  PRN Meds:.sodium chloride, acetaminophen, albuterol, nitroGLYCERIN, ondansetron (ZOFRAN) IV, sodium chloride, DISCONTD: albuterol  Time Spent in minutes  ELGERGAWY, DAWOOD M.D on 01/02/2012 at 10:10 AM  [facility] Office  [phone]

## 2012-01-02 NOTE — Progress Notes (Signed)
PROGRESS NOTE  Subjective:   Wanda Yang is a 71 y.o. female with severe COPD, CAD, HTN, PVD, hyperlipidemia admitted to my service with progressive dyspnea.  She has continued to smoke.  She has moderate- severe anemia.  She diuresed last night but is still feeling the same.  She is OK at rest but gets quite dyspneic with exertion.    Objective:    Vital Signs:   Temp:  [98.1 F (36.7 C)-98.7 F (37.1 C)] 98.1 F (36.7 C) (07/29 0444) Pulse Rate:  [70-85] 74  (07/29 0444) Resp:  [16-24] 18  (07/29 0444) BP: (112-172)/(62-77) 151/69 mmHg (07/29 0444) SpO2:  [92 %-100 %] 98 % (07/29 0444) FiO2 (%):  [3 %] 3 % (07/28 1824) Weight:  [180 lb 8 oz (81.874 kg)-194 lb (87.998 kg)] 187 lb 9.8 oz (85.1 kg) (07/29 0444)  Last BM Date: 01/01/12   24-hour weight change: Weight change:   Weight trends: Filed Weights   01/01/12 1337 01/01/12 1900 01/02/12 0444  Weight: 194 lb (87.998 kg) 180 lb 8 oz (81.874 kg) 187 lb 9.8 oz (85.1 kg)    Intake/Output:  07/28 0701 - 07/29 0700 In: 360 [P.O.:360] Out: 1600 [Urine:1600]     Physical Exam: BP 151/69  Pulse 74  Temp 98.1 F (36.7 C) (Oral)  Resp 18  Ht 5\' 8"  (1.727 m)  Wt 187 lb 9.8 oz (85.1 kg)  BMI 28.53 kg/m2  SpO2 98%  General: Vital signs reviewed and noted. Well-developed, well-nourished, in no acute distress; alert, appropriate and cooperative .  Head: Normocephalic, atraumatic.  Eyes: conjunctivae/corneas clear.  EOM's intact.   Throat: normal  Neck: Supple. Normal carotids. No JVD  Lungs:  Decreased breath sounds in upper lung fields.  Heart: Regular rate,  With normal  S1 S2. No murmurs, gallops or rubs  Abdomen:  Soft, non-tender, non-distended with normoactive bowel sounds. No hepatomegaly. No rebound/guarding. No abdominal masses.  Extremities: Distal pedal pulses are 2+ .  No edema.    Neurologic: A&O X3, CN II - XII are grossly intact. Motor strength is 5/5 in the all 4 extremities.  Psych: Responds to  questions appropriately with normal affect.    Labs: BMET:  [facility] 01/01/12 2012 01/01/12 1424  NA 137 136  K 4.0 4.1  CL 99 98  CO2 28 27  GLUCOSE 98 106*  BUN 18 18  CREATININE 1.50* 1.54*  CALCIUM 9.4 9.3  MG 2.1 --  PHOS -- --    Liver function tests:  [facility] 01/01/12 2012  AST 14  ALT 11  ALKPHOS 92  BILITOT 0.2*  PROT 7.2  ALBUMIN 3.0*   No results found for this basename: LIPASE:2,AMYLASE:2 in the last 72 hours  CBC:  Basename 01/02/12 0500 01/01/12 1424  WBC 9.9 10.5  NEUTROABS 8.0* 8.8*  HGB 8.0* 8.7*  HCT 25.7* 27.5*  MCV 91.8 92.0  PLT 286 311    Cardiac Enzymes:  Basename 01/01/12 1423  CKTOTAL --  CKMB --  TROPONINI <0.30    Coagulation Studies:  Basename 01/01/12 2012  LABPROT 14.3  INR 1.09     Basename 01/01/12 2012  TSH 2.507  T4TOTAL --  T3FREE --  THYROIDAB --   No results found for this basename: VITAMINB12,FOLATE,FERRITIN,TIBC,IRON,RETICCTPCT in the last 72 hours    Tele:  NSR  Medications:    Infusions:    Scheduled Medications:    . allopurinol  100 mg Oral BID  . arformoterol  15 mcg Nebulization BID  .  aspirin EC  81 mg Oral Daily  . atorvastatin  20 mg Oral q1800  . budesonide  0.5 mg Nebulization BID  . clopidogrel  75 mg Oral Q breakfast  . diltiazem  240 mg Oral Daily  . ferrous sulfate  325 mg Oral BID WC  . furosemide  40 mg Intravenous Once  . heparin  5,000 Units Subcutaneous Q8H  . nebivolol  5 mg Oral Daily  . senna-docusate  1 tablet Oral BID  . sodium chloride  3 mL Intravenous Q12H  . tiotropium  18 mcg Inhalation Daily  . venlafaxine  75 mg Oral Daily  . DISCONTD: aspirin  81 mg Oral Daily  . DISCONTD: Iron  325 mg Oral BID    Assessment/ Plan:   1. COPD:  I suspect this dyspnea  is primarily due to COPD in the face of significant anemia.    Her CXR reveals hyperinflation and no significant pulmonary edema.   She had normal LV systolic function by echo in 2009.  I do not think  this dyspnea is due to CHF and at this point I would not give her the diagnosis of CHF.  She likely has some pulmonary hypertension related to her COPD.   Repeat echo today.   She is a patient of Dr. Shelle Iron.  Repeat PFTs.  2. Anemia:  This is certainly contributing to her dyspnea.  Will check stools. She has been on iron supplements.  Will ask Int. Med for help.  3.  CAD (coronary artery disease)   she denies any angina, continue Plavix, ASA  4.  HYPERLIPIDEMIA (07/24/2008)   copntinue Atorvastatin  5.  HYPERTENSION (07/24/2008)   Stable , continue Diltiazem 240 CD  6.  Tobacco abuse    She continues to smoke and has severe COPD.  I have had many disscussions with her and she knows that she is harming herself.    7.  Renal insufficiency  Moderate renal insufficiency  Disposition: will ask Int. Med for assistance as her primary issue seems to be COPD and not CHF.    Length of Stay: 1  [name], [name]., MD, [facility] 01/02/2012, 7:36 AM Office [phone] Pager [phone]

## 2012-01-02 NOTE — Progress Notes (Signed)
  Echocardiogram 2D Echocardiogram has been performed.  Nedra Mcinnis 01/02/2012, 9:20 AM

## 2012-01-03 ENCOUNTER — Ambulatory Visit: Payer: Federal, State, Local not specified - PPO | Admitting: Pulmonary Disease

## 2012-01-03 DIAGNOSIS — J9610 Chronic respiratory failure, unspecified whether with hypoxia or hypercapnia: Secondary | ICD-10-CM

## 2012-01-03 DIAGNOSIS — N289 Disorder of kidney and ureter, unspecified: Secondary | ICD-10-CM

## 2012-01-03 LAB — RETICULOCYTES
RBC.: 2.75 MIL/uL — ABNORMAL LOW (ref 3.87–5.11)
Retic Count, Absolute: 66 10*3/uL (ref 19.0–186.0)
Retic Ct Pct: 2.4 % (ref 0.4–3.1)

## 2012-01-03 LAB — BASIC METABOLIC PANEL
Calcium: 9.5 mg/dL (ref 8.4–10.5)
Creatinine, Ser: 1.77 mg/dL — ABNORMAL HIGH (ref 0.50–1.10)
GFR calc non Af Amer: 28 mL/min — ABNORMAL LOW (ref >90)
Sodium: 140 mEq/L (ref 135–145)

## 2012-01-03 LAB — IRON AND TIBC: UIBC: 296 ug/dL (ref 125–400)

## 2012-01-03 LAB — GLUCOSE, CAPILLARY
Glucose-Capillary: 113 mg/dL — ABNORMAL HIGH (ref 70–99)
Glucose-Capillary: 113 mg/dL — ABNORMAL HIGH (ref 70–99)

## 2012-01-03 LAB — VITAMIN B12: Vitamin B-12: 261 pg/mL (ref 211–911)

## 2012-01-03 LAB — FOLATE: Folate: >20 ng/mL

## 2012-01-03 MED ORDER — NEBIVOLOL HCL 5 MG PO TABS: 5.0000 mg | ORAL_TABLET | Freq: Every day | ORAL | Status: DC

## 2012-01-03 NOTE — Telephone Encounter (Signed)
Sent bystolic 56m mg script to [facility] to see if price would be better,

## 2012-01-03 NOTE — Progress Notes (Signed)
PROGRESS NOTE  Subjective:   Wanda Yang is a 71 y.o. female with severe COPD, CAD, HTN, PVD, hyperlipidemia admitted to my service with progressive dyspnea.  She has continued to smoke.  She has moderate- severe anemia.  She is feeling a bit better.  She was seen by Int. Med. Yesterday and was started on prednisone.   Objective:    Vital Signs:   Temp:  [98.1 F (36.7 C)-98.4 F (36.9 C)] 98.1 F (36.7 C) (07/30 0548) Pulse Rate:  [66-79] 66  (07/30 0548) Resp:  [20] 20  (07/30 0548) BP: (123-145)/(57-65) 123/58 mmHg (07/30 0548) SpO2:  [94 %-100 %] 100 % (07/30 0548) Weight:  [188 lb 6.4 oz (85.458 kg)] 188 lb 6.4 oz (85.458 kg) (07/30 0548)  Last BM Date: 01/02/12   24-hour weight change: Weight change: -5 lb 9.6 oz (-2.54 kg)  Weight trends: Filed Weights   01/01/12 1900 01/02/12 0444 01/03/12 0548  Weight: 180 lb 8 oz (81.874 kg) 187 lb 9.8 oz (85.1 kg) 188 lb 6.4 oz (85.458 kg)    Intake/Output:  07/29 0701 - 07/30 0700 In: 820 [P.O.:820] Out: 850 [Urine:850]     Physical Exam: BP 123/58  Pulse 66  Temp 98.1 F (36.7 C) (Oral)  Resp 20  Ht 5\' 8"  (1.727 m)  Wt 188 lb 6.4 oz (85.458 kg)  BMI 28.65 kg/m2  SpO2 100%  General: Vital signs reviewed and noted. Well-developed, well-nourished, in no acute distress; alert, appropriate and cooperative .  Head: Normocephalic, atraumatic.  Eyes: conjunctivae/corneas clear.  EOM's intact.   Throat: normal  Neck: Supple. Normal carotids. No JVD  Lungs:  Decreased breath sounds in upper lung fields. Few wheezes  Heart: Regular rate,  With normal  S1 S2. No murmurs, gallops or rubs  Abdomen:  Soft, non-tender, non-distended with normoactive bowel sounds. No hepatomegaly. No rebound/guarding. No abdominal masses.  Extremities: Distal pedal pulses are 2+ .  No edema.    Neurologic: A&O X3, CN II - XII are grossly intact. Motor strength is 5/5 in the all 4 extremities.  Psych: Responds to questions appropriately with  normal affect.    Labs: BMET:  Basename 01/03/12 0537 01/02/12 0500 01/01/12 2012  NA 140 141 --  K 4.8 3.9 --  CL 101 101 --  CO2 30 29 --  GLUCOSE 149* 102* --  BUN 25* 17 --  CREATININE 1.77* 1.57* --  CALCIUM 9.5 9.2 --  MG -- -- 2.1  PHOS -- -- --    Liver function tests:  [facility] 01/01/12 2012  AST 14  ALT 11  ALKPHOS 92  BILITOT 0.2*  PROT 7.2  ALBUMIN 3.0*   No results found for this basename: LIPASE:2,AMYLASE:2 in the last 72 hours  CBC:  Basename 01/02/12 0500 01/01/12 1424  WBC 9.9 10.5  NEUTROABS 8.0* 8.8*  HGB 8.0* 8.7*  HCT 25.7* 27.5*  MCV 91.8 92.0  PLT 286 311    Cardiac Enzymes:  Basename 01/02/12 0655 01/01/12 1423  CKTOTAL 45 --  CKMB 1.8 --  TROPONINI <0.30 <0.30    Coagulation Studies:  Basename 01/01/12 2012  LABPROT 14.3  INR 1.09     Basename 01/01/12 2012  TSH 2.507  T4TOTAL --  T3FREE --  THYROIDAB --    Basename 01/03/12 0537  VITAMINB12 --  FOLATE --  FERRITIN --  TIBC --  IRON --  RETICCTPCT 2.4      Tele:  NSR  Medications:    Infusions:  Scheduled Medications:    . albuterol  2.5 mg Nebulization Once  . allopurinol  100 mg Oral BID  . arformoterol  15 mcg Nebulization BID  . aspirin EC  81 mg Oral Daily  . atorvastatin  20 mg Oral q1800  . budesonide  0.5 mg Nebulization BID  . clopidogrel  75 mg Oral Q breakfast  . diltiazem  240 mg Oral Daily  . ferrous sulfate  325 mg Oral BID WC  . heparin  5,000 Units Subcutaneous Q8H  . nebivolol  5 mg Oral Daily  . nicotine  21 mg Transdermal Q24H  . pantoprazole  40 mg Oral Q1200  . predniSONE  20 mg Oral Q breakfast  . senna-docusate  1 tablet Oral BID  . sodium chloride  3 mL Intravenous Q12H  . tiotropium  18 mcg Inhalation Daily  . venlafaxine  75 mg Oral Daily  . DISCONTD: albuterol  2.5 mg Nebulization Q4H  . DISCONTD: ipratropium  0.5 mg Nebulization Q4H    Assessment/ Plan:   1. COPD:  I suspect this dyspnea  is primarily due  to COPD in the face of significant anemia.    Her CXR reveals hyperinflation and no significant pulmonary edema.   She had normal LV systolic function by echo and has mild diastolic dysfunction.  This admission was due to COPD exacerbation. Acute on chronic COPD.   2. Anemia:  This is certainly contributing to her dyspnea.  Will check stools. She has been on iron supplements.    3.  CAD (coronary artery disease)   she denies any angina, continue Plavix, ASA  4.  HYPERLIPIDEMIA (07/24/2008)   copntinue Atorvastatin  5.  HYPERTENSION (07/24/2008)   Stable , continue Diltiazem 240 CD  6.  Tobacco abuse    She continues to smoke and has severe COPD.  I have had many disscussions with her and she knows that she is harming herself.    7.  Renal insufficiency  Moderate renal insufficiency.  Creatinine is slightly higher after diuresis.  Holding further Lasix.  Recheck tomorrow.    Disposition: possible DC tomorrow if her creatinine improves.  Length of Stay: 2  Alvia Grove., MD, [facility] 01/03/2012, 8:12 AM Office [phone] Pager [phone]

## 2012-01-03 NOTE — Progress Notes (Signed)
[facility]                                                                                Patient Demographics  Wanda Yang, is a 71 y.o. female  WUJ:[id number]  NFA:[id number]  DOB - 05/26/1941  Admit date - 01/01/2012  Admitting Physician Wanda Mixer, MD  Outpatient Primary MD for the patient is Wanda Medal, MD  LOS - 2   Chief Complaint  Patient presents with  . Shortness of Breath  . Weakness        Assessment & Plan  This is 71 year old female with advanced COPD at home oxygen, presents with complaints of shortness of breath, thought initially to be due to CHF, has had elevated proBNP, the patient did not have any volume overload picture in physical exam or in chest x-ray, had significant wheezing, where her shortness of breath is caused mainly by COPD exacerbation.  Principal Problem:  *COPD (chronic obstructive pulmonary disease) Active Problems:  HYPERLIPIDEMIA  HYPERTENSION  CAD (coronary artery disease)  Tobacco abuse  Renal insufficiency  1. COPD exacerbation. Patient has advanced COPD, improving on by mouth prednisone, and nebulizer treatment, can be discharged home on Medrol pack.  2. Anemia patient has low iron level, ferritin is not done, we'll continue with iron supplement, for colonoscopy as an outpatient this September, Hemoccult was ordered by primary team.  3. Coronary artery disease denies any chest pain denies any chest pain.Continue with aspirin Plavix and statin   4.tobacco abuse .patient was counseled, cont nicotine patch.  5. Acute on chronic renal  Is higher today, this is most likely to previous diuresis, will hold on diuresis and will check BMP in am. Wanda Yang, Wanda Yang M.D on 01/03/2012 at 3:40 PM   [facility] Office  [phone]    Subjective:   Wanda Yang today has, No headache, No chest pain, No abdominal pain - No Nausea, No new weakness tingling or numbness, No Cough -has  baseline shortness of breath.  Objective:   Filed Vitals:   01/03/12 0548 01/03/12 0832 01/03/12 1032 01/03/12 1433  BP: 123/58  136/64 146/62  Pulse: 66  70 75  Temp: 98.1 F (36.7 C)   97.6 F (36.4 C)  TempSrc: Oral   Oral  Resp: 20   20  Height:      Weight: 85.458 kg (188 lb 6.4 oz)     SpO2: 100% 92%  97%    Wt Readings from Last 3 Encounters:  01/03/12 85.458 kg (188 lb 6.4 oz)  09/20/11 89.721 kg (197 lb 12.8 oz)  07/29/11 91.082 kg (200 lb 12.8 oz)     Intake/Output Summary (Last 24 hours) at 01/03/12 1540 Last data filed at 01/03/12 1438  Gross per 24 hour  Intake   1200 ml  Output   1600 ml  Net   -400 ml    Exam Awake Alert, Oriented X 3, No new F.N deficits, Normal affect Wanda Yang.AT,PERRAL Supple Neck,No JVD, No cervical lymphadenopathy appriciated.  Symmetrical Chest wall movement ,decreased  air movement bilaterally, mild wheezing  RRR,No Gallops,Rubs or new Murmurs, No Parasternal Heave +ve B.Sounds, Abd Soft, Non tender, No organomegaly appriciated, No  rebound - guarding or rigidity. No Cyanosis, Clubbing or edema, No new Rash or bruise     Data Review   CBC  Lab 01/02/12 0500 01/01/12 1424  WBC 9.9 10.5  HGB 8.0* 8.7*  HCT 25.7* 27.5*  PLT 286 311  MCV 91.8 92.0  MCH 28.6 29.1  MCHC 31.1 31.6  RDW 16.6* 16.8*  LYMPHSABS 1.4 1.1  MONOABS 0.5 0.5  EOSABS 0.1 0.1  BASOSABS 0.0 0.0  BANDABS -- --    Chemistries   Lab 01/03/12 0537 01/02/12 0500 01/01/12 2012 01/01/12 1424  NA 140 141 137 136  K 4.8 3.9 4.0 4.1  CL 101 101 99 98  CO2 30 29 28 27   GLUCOSE 149* 102* 98 106*  BUN 25* 17 18 18   CREATININE 1.77* 1.57* 1.50* 1.54*  CALCIUM 9.5 9.2 9.4 9.3  MG -- -- 2.1 --  AST -- -- 14 --  ALT -- -- 11 --  ALKPHOS -- -- 92 --  BILITOT -- -- 0.2* --   ------------------------------------------------------------------------------------------------------------------ estimated creatinine clearance is 33.8 ml/min (by C-G formula based on  Cr of 1.77). ------------------------------------------------------------------------------------------------------------------  Basename 01/02/12 0500  HGBA1C 5.9*   ------------------------------------------------------------------------------------------------------------------  Basename 01/02/12 0500  CHOL 145  HDL 38*  LDLCALC 64  TRIG 161*  CHOLHDL 3.8  LDLDIRECT --   ------------------------------------------------------------------------------------------------------------------  Basename 01/01/12 2012  TSH 2.507  T4TOTAL --  T3FREE --  THYROIDAB --   ------------------------------------------------------------------------------------------------------------------  Wanda Yang 01/03/12 0537  VITAMINB12 --  FOLATE --  FERRITIN --  TIBC 323  IRON 27*  RETICCTPCT 2.4    Coagulation profile  Lab 01/01/12 2012  INR 1.09  PROTIME --    No results found for this basename: DDIMER:2 in the last 72 hours  Cardiac Enzymes  Lab 01/02/12 0655 01/01/12 1423  CKMB 1.8 --  TROPONINI <0.30 <0.30  MYOGLOBIN -- --   ------------------------------------------------------------------------------------------------------------------ No components found with this basename: POCBNP:3  Micro Results No results found for this or any previous visit (from the past 240 hour(s)).  Radiology Reports Dg Chest 2 View  01/01/2012  *RADIOLOGY REPORT*  Clinical Data: Shortness of breath  CHEST - 2 VIEW  Comparison: 04/14/2008  Findings: Cardiomediastinal silhouette is stable.  No acute infiltrate or pleural effusion.  No pulmonary edema.  Stable hyperinflation and streaky bilateral basilar scarring. Mild degenerative changes thoracic spine.  IMPRESSION: No active disease.  Stable hyperinflation and bilateral basilar scarring.  Original Report Authenticated By: Wanda Yang, M.D.    Scheduled Meds:   . aspirin EC  81 mg Oral Daily  . atorvastatin  20 mg Oral q1800  . budesonide  0.5 mg  Nebulization BID  . clopidogrel  75 mg Oral Q breakfast  . diltiazem  240 mg Oral Daily  . ferrous sulfate  325 mg Oral BID WC  . heparin  5,000 Units Subcutaneous Q8H  . nebivolol  5 mg Oral Daily  . nicotine  21 mg Transdermal Q24H  . pantoprazole  40 mg Oral Q1200  . predniSONE  20 mg Oral Q breakfast  . senna-docusate  1 tablet Oral BID  . tiotropium  18 mcg Inhalation Daily  . venlafaxine  75 mg Oral Daily  . DISCONTD: albuterol  2.5 mg Nebulization Q4H  . DISCONTD: allopurinol  100 mg Oral BID  . DISCONTD: arformoterol  15 mcg Nebulization BID  . DISCONTD: sodium chloride  3 mL Intravenous Q12H   Continuous Infusions:  PRN Meds:.acetaminophen, albuterol, nitroGLYCERIN, ondansetron (ZOFRAN) IV, DISCONTD: sodium chloride,  DISCONTD: sodium chloride

## 2012-01-03 NOTE — Telephone Encounter (Signed)
Samples / discount card given, husband aware, available at desk.

## 2012-01-03 NOTE — Progress Notes (Signed)
Utilization review completed.  

## 2012-01-04 DIAGNOSIS — K219 Gastro-esophageal reflux disease without esophagitis: Secondary | ICD-10-CM

## 2012-01-04 LAB — CBC
MCH: 29.3 pg (ref 26.0–34.0)
MCHC: 31.7 g/dL (ref 30.0–36.0)
MCV: 92.4 fL (ref 78.0–100.0)
Platelets: 329 10*3/uL (ref 150–400)
RBC: 2.9 MIL/uL — ABNORMAL LOW (ref 3.87–5.11)

## 2012-01-04 LAB — BASIC METABOLIC PANEL
CO2: 29 mEq/L (ref 19–32)
CO2: 28 mEq/L (ref 19–32)
Calcium: 9.4 mg/dL (ref 8.4–10.5)
Calcium: 9.7 mg/dL (ref 8.4–10.5)
Creatinine, Ser: 2.05 mg/dL — ABNORMAL HIGH (ref 0.50–1.10)
Creatinine, Ser: 2 mg/dL — ABNORMAL HIGH (ref 0.50–1.10)
GFR calc Af Amer: 27 mL/min — ABNORMAL LOW (ref >90)
GFR calc non Af Amer: 23 mL/min — ABNORMAL LOW (ref >90)
GFR calc non Af Amer: 24 mL/min — ABNORMAL LOW (ref >90)
Glucose, Bld: 121 mg/dL — ABNORMAL HIGH (ref 70–99)
Sodium: 140 mEq/L (ref 135–145)

## 2012-01-04 LAB — GLUCOSE, CAPILLARY: Glucose-Capillary: 97 mg/dL (ref 70–99)

## 2012-01-04 MED ORDER — SODIUM POLYSTYRENE SULFONATE 15 GM/60ML PO SUSP
15.0000 g | Freq: Once | ORAL | Status: AC
  Administered 2012-01-04: 15 g via ORAL
  Filled 2012-01-04: qty 60

## 2012-01-04 MED ORDER — PREDNISONE 10 MG PO TABS: ORAL_TABLET | ORAL | Status: DC

## 2012-01-04 NOTE — Progress Notes (Signed)
[facility] PROGRESS NOTE  DERETHA ERTLE ZOX:[id number] DOB: [birth year] DOA: 01/01/2012 PCP: Henri Medal, MD  Assessment/Plan: Principal Problem:  *COPD (chronic obstructive pulmonary disease) Active Problems:  HYPERLIPIDEMIA  HYPERTENSION  CAD (coronary artery disease)  Tobacco abuse  Renal insufficiency   1. COPD exacerbation. Patient has advanced COPD, which overall, appears to have progressed, against a background of continued smoking. Today, she is devoid of wheeze, and appears at her new base-line. She has home Oxygen supplementation, as well as home nebulizers. She will follow up with her primary pulmonologist, Dr Marcelyn Bruins, on discharge. Tapering steroids over the next 6 days.  2. Anemia: Patient has a normocytic anemia, with low iron level of 27, and ferritin of 8. Currently on iron supplement. For colonoscopy as an outpatient this September, Hemoccult was ordered by primary team.  3. Coronary artery disease:  No chest pain or clinical evidence of ACS. Continue with aspirin Plavix and statin  4. tobacco abuse .patient was counseled appropriately, and managed with Nicoderm CQ patch.  5. Acute on chronic renal disease. Patient had a creatinine of 1.57 at presentation, against a background of baseline creatinine 1.5 in 02/2010, and atrophic left kidney. Today, creatinine is elevated at 2.05. Diuretics on hold. Following renal indices.    Code Status: Full Family Communication: Case fully discussed with patient and spouse.  Disposition Plan: Per primary.   Brief narrative: 71 y.o. female, with contrast medical history of coronary artery disease, tobacco use, COPD multiple episodes of COPD exacerbation last before 2 weeks where she was tried on by mouth prednisone, presents with shortness of breath over the last 2 weeks. She is on baseline 3 L oxygen at home 24/7, reports a progression of her dyspnea mainly upon exertion, denies any orthopnea, denies any worsening  of her cough or productive phlegm, reports mild lower extremity edema upon presentation, patient and her BNP was elevated upon admission at 1194, patient was initially admitted for CHF exacerbation, was started on diuresis. Medical consult was called for management of her COPD.   Consultants:  [facility].  Procedures:  See below  Antibiotics:  N/A  HPI/Subjective: Feels better.   Objective: Vital signs in last 24 hours: Temp:  [97.2 F (36.2 C)-98.8 F (37.1 C)] 98.3 F (36.8 C) (07/31 1520) Pulse Rate:  [67-80] 69  (07/31 1520) Resp:  [18-20] 20  (07/31 1520) BP: (133-154)/(56-61) 154/61 mmHg (07/31 1520) SpO2:  [96 %-100 %] 100 % (07/31 1520) Weight:  [86.456 kg (190 lb 9.6 oz)] 86.456 kg (190 lb 9.6 oz) (07/31 0351) Weight change: 0.998 kg (2 lb 3.2 oz) Last BM Date: 01/04/12  Intake/Output from previous day: 07/30 0701 - 07/31 0700 In: 1500 [P.O.:1500] Out: 1700 [Urine:1700] Total I/O In: 480 [P.O.:480] Out: 300 [Urine:300]   Physical Exam: General: Comfortable, alert, communicative, fully oriented, not short of breath at rest.  HEENT:  Moderate clinical pallor, no jaundice, no conjunctival injection or discharge. NECK:  Supple, JVP not seen, no carotid bruits, no palpable lymphadenopathy, no palpable goiter. CHEST:  Clinically clear to auscultation, no wheezes, no crackles. HEART:  Sounds 1 and 2 heard, normal, regular, no murmurs. ABDOMEN:  Full, soft, non-tender, no palpable organomegaly, no palpable masses, normal bowel sounds. GENITALIA:  Not examined. LOWER EXTREMITIES:  No pitting edema, palpable peripheral pulses. MUSCULOSKELETAL SYSTEM:  Generalized osteoarthritic changes, otherwise, normal. CENTRAL NERVOUS SYSTEM:  No focal neurologic deficit on gross examination.  Lab Results:  Basename 01/02/12 0500  WBC 9.9  HGB 8.0*  HCT  25.7*  PLT 286    Basename 01/04/12 0605 01/03/12 0537  NA 140 140  K 5.4* 4.8  CL 100 101  CO2 29 30  GLUCOSE 100* 149*   BUN 31* 25*  CREATININE 2.05* 1.77*  CALCIUM 9.4 9.5   No results found for this or any previous visit (from the past 240 hour(s)).   Studies/Results: No results found.  Medications: Scheduled Meds:   . aspirin EC  81 mg Oral Daily  . atorvastatin  20 mg Oral q1800  . budesonide  0.5 mg Nebulization BID  . clopidogrel  75 mg Oral Q breakfast  . diltiazem  240 mg Oral Daily  . ferrous sulfate  325 mg Oral BID WC  . heparin  5,000 Units Subcutaneous Q8H  . nebivolol  5 mg Oral Daily  . nicotine  21 mg Transdermal Q24H  . pantoprazole  40 mg Oral Q1200  . predniSONE  20 mg Oral Q breakfast  . senna-docusate  1 tablet Oral BID  . tiotropium  18 mcg Inhalation Daily  . venlafaxine  75 mg Oral Daily   Continuous Infusions:  PRN Meds:.acetaminophen, albuterol, nitroGLYCERIN, ondansetron (ZOFRAN) IV    LOS: 3 days   Cadence Haslam,CHRISTOPHER  [facility] Pager [phone]. If 8PM-8AM, please contact night-coverage at [email or web address], password [facility] 01/04/2012, 4:00 PM  LOS: 3 days

## 2012-01-04 NOTE — Plan of Care (Signed)
Problem: Phase I Progression Outcomes Goal: EF % per last Echo/documented,Core Reminder form on chart Outcome: Completed/Met Date Met:  01/04/12 EF=55-60%

## 2012-01-04 NOTE — Progress Notes (Signed)
PROGRESS NOTE  Subjective:   Wanda Yang is a 71 y.o. female with severe COPD, CAD, HTN, PVD, hyperlipidemia admitted to my service with progressive dyspnea.  She has continued to smoke.  She has moderate- severe anemia.  She is feeling a bit better.  She was seen by Int. Med. and was started on prednisone. She is back to her baseline and is ready to go home.  Objective:    Vital Signs:   Temp:  [97.2 F (36.2 C)-98.8 F (37.1 C)] 97.2 F (36.2 C) (07/31 0351) Pulse Rate:  [70-80] 80  (07/31 0351) Resp:  [18-20] 18  (07/31 0351) BP: (136-147)/(56-64) 144/56 mmHg (07/31 0351) SpO2:  [92 %-99 %] 98 % (07/31 0351) Weight:  [190 lb 9.6 oz (86.456 kg)] 190 lb 9.6 oz (86.456 kg) (07/31 0351)  Last BM Date: 01/02/12   24-hour weight change: Weight change: 2 lb 3.2 oz (0.998 kg)  Weight trends: Filed Weights   01/02/12 0444 01/03/12 0548 01/04/12 0351  Weight: 187 lb 9.8 oz (85.1 kg) 188 lb 6.4 oz (85.458 kg) 190 lb 9.6 oz (86.456 kg)    Intake/Output:  07/30 0701 - 07/31 0700 In: 1500 [P.O.:1500] Out: 1700 [Urine:1700]     Physical Exam: BP 144/56  Pulse 80  Temp 97.2 F (36.2 C) (Oral)  Resp 18  Ht 5\' 8"  (1.727 m)  Wt 190 lb 9.6 oz (86.456 kg)  BMI 28.98 kg/m2  SpO2 98%  General: Vital signs reviewed and noted. Well-developed, well-nourished, in no acute distress; alert, appropriate and cooperative .  Head: Normocephalic, atraumatic.  Eyes: conjunctivae/corneas clear.  EOM's intact.   Throat: normal  Neck: Supple. Normal carotids. No JVD  Lungs:  Decreased breath sounds in upper lung fields. Few wheezes  Heart: Regular rate,  With normal  S1 S2. No murmurs, gallops or rubs  Abdomen:  Soft, non-tender, non-distended with normoactive bowel sounds. No hepatomegaly. No rebound/guarding. No abdominal masses.  Extremities: Distal pedal pulses are 2+ .  No edema.    Neurologic: A&O X3, CN II - XII are grossly intact. Motor strength is 5/5 in the all 4 extremities.    Psych: Responds to questions appropriately with normal affect.    Labs: BMET:  Basename 01/04/12 0605 01/03/12 0537 01/01/12 2012  NA 140 140 --  K 5.4* 4.8 --  CL 100 101 --  CO2 29 30 --  GLUCOSE 100* 149* --  BUN 31* 25* --  CREATININE 2.05* 1.77* --  CALCIUM 9.4 9.5 --  MG -- -- 2.1  PHOS -- -- --    Liver function tests:  [facility] 01/01/12 2012  AST 14  ALT 11  ALKPHOS 92  BILITOT 0.2*  PROT 7.2  ALBUMIN 3.0*   No results found for this basename: LIPASE:2,AMYLASE:2 in the last 72 hours  CBC:  Basename 01/02/12 0500 01/01/12 1424  WBC 9.9 10.5  NEUTROABS 8.0* 8.8*  HGB 8.0* 8.7*  HCT 25.7* 27.5*  MCV 91.8 92.0  PLT 286 311    Cardiac Enzymes:  Basename 01/02/12 0655 01/01/12 1423  CKTOTAL 45 --  CKMB 1.8 --  TROPONINI <0.30 <0.30    Coagulation Studies:  Basename 01/01/12 2012  LABPROT 14.3  INR 1.09     Basename 01/01/12 2012  TSH 2.507  T4TOTAL --  T3FREE --  THYROIDAB --    Basename 01/03/12 0537  VITAMINB12 261  FOLATE >20.0  FERRITIN 57  TIBC 323  IRON 27*  RETICCTPCT 2.4  Tele:  NSR  Medications:    Infusions:    Scheduled Medications:    . aspirin EC  81 mg Oral Daily  . atorvastatin  20 mg Oral q1800  . budesonide  0.5 mg Nebulization BID  . clopidogrel  75 mg Oral Q breakfast  . diltiazem  240 mg Oral Daily  . ferrous sulfate  325 mg Oral BID WC  . heparin  5,000 Units Subcutaneous Q8H  . nebivolol  5 mg Oral Daily  . nicotine  21 mg Transdermal Q24H  . pantoprazole  40 mg Oral Q1200  . predniSONE  20 mg Oral Q breakfast  . senna-docusate  1 tablet Oral BID  . tiotropium  18 mcg Inhalation Daily  . venlafaxine  75 mg Oral Daily  . DISCONTD: allopurinol  100 mg Oral BID  . DISCONTD: arformoterol  15 mcg Nebulization BID  . DISCONTD: sodium chloride  3 mL Intravenous Q12H    Assessment/ Plan:   1. COPD:  I suspect this dyspnea  is primarily due to COPD in the face of significant anemia.    Her  CXR reveals hyperinflation and no significant pulmonary edema.   She had normal LV systolic function by echo and has mild diastolic dysfunction.  This admission was due to COPD exacerbation. Acute on chronic COPD.  2. Anemia:  This is certainly contributing to her dyspnea.  Will check stools. She has been on iron supplements.    3.  CAD (coronary artery disease)   she denies any angina, continue Plavix, ASA  4.  HYPERLIPIDEMIA (07/24/2008)   copntinue Atorvastatin  5.  HYPERTENSION (07/24/2008)   Stable , continue Diltiazem 240 CD  6.  Tobacco abuse    She continues to smoke and has severe COPD.  I have had many disscussions with her and she knows that she is harming herself.    7.  Renal insufficiency  Moderate renal insufficiency.  Creatinine is slightly higher after diuresis.  Holding further Lasix.  Recheck tomorrow.    Disposition: DC to home  Add medrol dosepack Continue home meds. She needs to see pulmonary this week or next. If she returns for relapse, she needs to be admitted by the pulmonary service.  Length of Stay: 3  [name], [name]., MD, [facility] 01/04/2012, 7:34 AM Office [phone] Pager [phone]

## 2012-01-04 NOTE — Progress Notes (Signed)
Reviewing patient's labwork from today, Cr has elevated progressively since admission with steep elevation from yesterday to today (1.77 --> 2.05/2.00)(baseline 1.4-1.6). Hyperkalemia at 5.4 noted this AM. Patient also has known normocytic anemia. Hgb on CBC today 8.0-8.5. She has colonoscopy scheduled for September. Have ordered FOBT. Patient denies active bleeding. Spoke to Dr. [place] who felt it would be best to monitor overnight and repeat BMET, CBC in AM. Repeat BMET this afternoon revealed higher potassium at 5.7. Kayexelate given. Will reassess tomorrow morning. Spoke to patient who understood.    Jacqulyn Bath, PA-C 01/04/2012 6:09 PM

## 2012-01-05 DIAGNOSIS — D649 Anemia, unspecified: Secondary | ICD-10-CM

## 2012-01-05 DIAGNOSIS — I2510 Atherosclerotic heart disease of native coronary artery without angina pectoris: Secondary | ICD-10-CM

## 2012-01-05 LAB — BASIC METABOLIC PANEL
BUN: 32 mg/dL — ABNORMAL HIGH (ref 6–23)
BUN: 33 mg/dL — ABNORMAL HIGH (ref 6–23)
Calcium: 9.6 mg/dL (ref 8.4–10.5)
Chloride: 99 mEq/L (ref 96–112)
Creatinine, Ser: 1.85 mg/dL — ABNORMAL HIGH (ref 0.50–1.10)
GFR calc non Af Amer: 26 mL/min — ABNORMAL LOW (ref >90)
Glucose, Bld: 99 mg/dL (ref 70–99)
Glucose, Bld: 146 mg/dL — ABNORMAL HIGH (ref 70–99)
Potassium: 4.2 mEq/L (ref 3.5–5.1)

## 2012-01-05 LAB — CBC
HCT: 25.3 % — ABNORMAL LOW (ref 36.0–46.0)
Hemoglobin: 7.9 g/dL — ABNORMAL LOW (ref 12.0–15.0)
MCH: 28.5 pg (ref 26.0–34.0)
MCHC: 31.2 g/dL (ref 30.0–36.0)

## 2012-01-05 LAB — GLUCOSE, CAPILLARY
Glucose-Capillary: 119 mg/dL — ABNORMAL HIGH (ref 70–99)
Glucose-Capillary: 94 mg/dL (ref 70–99)

## 2012-01-05 LAB — OCCULT BLOOD X 1 CARD TO LAB, STOOL: Fecal Occult Bld: POSITIVE

## 2012-01-05 MED ORDER — FUROSEMIDE 10 MG/ML IJ SOLN
20.0000 mg | Freq: Once | INTRAMUSCULAR | Status: AC
  Administered 2012-01-05: 20 mg via INTRAVENOUS
  Filled 2012-01-05: qty 2

## 2012-01-05 NOTE — Progress Notes (Signed)
Infusion complete.

## 2012-01-05 NOTE — Progress Notes (Signed)
[facility] PROGRESS NOTE  Wanda Yang AVW:[id number] DOB: 25-Jul-1940 DOA: 01/01/2012 PCP: Henri Medal, MD  Assessment/Plan: Principal Problem:  *COPD (chronic obstructive pulmonary disease) Active Problems:  HYPERLIPIDEMIA  CAD (coronary artery disease)  Tobacco abuse  Renal insufficiency  Anemia   1. COPD exacerbation. Patient has advanced COPD, which overall, appears to have progressed, against a background of continued smoking. Today, she is devoid of wheeze, and appears at her new base-line. She has home Oxygen supplementation, as well as home nebulizers. She will follow up with her primary pulmonologist, Dr Marcelyn Bruins, on discharge. Tapering steroids over the next few days.  2. Anemia: Patient has a normocytic anemia, with low iron level of 27, and ferritin of 8. Currently on iron supplement. For colonoscopy as an outpatient this September, Hemoccult was ordered by primary team and is positive. Today, hemoglobin is 7.9. Will transfuse 1 unit PRBC,as this is clearly contributory to her dyspnea and reduced effort tolerance.  3. Coronary artery disease:  No chest pain or clinical evidence of ACS. Continue with aspirin Plavix and statin  4. Tobacco abuse .patient was counseled appropriately, and managed with Nicoderm CQ patch.  5. Acute on chronic renal disease. Patient had a creatinine of 1.57 at presentation, against a background of baseline creatinine 1.5 in 02/2010, and atrophic left kidney. Today, creatinine is improved at 1.85. Diuretics on hold. Following renal indices.    Code Status: Full Family Communication: Case fully discussed with patient and spouse.  Disposition Plan: Per primary.   Brief narrative: 71 y.o. female, with contrast medical history of coronary artery disease, tobacco use, COPD multiple episodes of COPD exacerbation last before 2 weeks where she was tried on by mouth prednisone, presents with shortness of breath over the last 2 weeks. She  is on baseline 3 L oxygen at home 24/7, reports a progression of her dyspnea mainly upon exertion, denies any orthopnea, denies any worsening of her cough or productive phlegm, reports mild lower extremity edema upon presentation, patient and her BNP was elevated upon admission at 1194, patient was initially admitted for CHF exacerbation, was started on diuresis. Medical consult was called for management of her COPD.   Consultants:  [facility].  Procedures:  See below  Antibiotics:  N/A  HPI/Subjective: Feels better.   Objective: Vital signs in last 24 hours: Temp:  [97.2 F (36.2 C)-98.3 F (36.8 C)] 97.2 F (36.2 C) (08/01 0411) Pulse Rate:  [69-81] 73  (08/01 1048) Resp:  [20-22] 22  (08/01 0411) BP: (101-165)/(61-72) 150/68 mmHg (08/01 1048) SpO2:  [97 %-100 %] 97 % (08/01 1105) Weight:  [86.274 kg (190 lb 3.2 oz)] 86.274 kg (190 lb 3.2 oz) (08/01 0411) Weight change: -0.181 kg (-6.4 oz) Last BM Date: 01/04/12  Intake/Output from previous day: 07/31 0701 - 08/01 0700 In: 1078 [P.O.:1078] Out: 800 [Urine:800] Total I/O In: 480 [P.O.:480] Out: 300 [Urine:300]   Physical Exam: General: Comfortable, alert, communicative, fully oriented, not short of breath at rest.  HEENT:  Moderate clinical pallor, no jaundice, no conjunctival injection or discharge. NECK:  Supple, JVP not seen, no carotid bruits, no palpable lymphadenopathy, no palpable goiter. CHEST:  Clinically clear to auscultation, no wheezes, no crackles. HEART:  Sounds 1 and 2 heard, normal, regular, no murmurs. ABDOMEN:  Full, soft, non-tender, no palpable organomegaly, no palpable masses, normal bowel sounds. GENITALIA:  Not examined. LOWER EXTREMITIES:  No pitting edema, palpable peripheral pulses. MUSCULOSKELETAL SYSTEM:  Generalized osteoarthritic changes, otherwise, normal. CENTRAL NERVOUS SYSTEM:  No  focal neurologic deficit on gross examination.  Lab Results:  [facility] 01/05/12 0714 01/04/12 1606  WBC  10.2 12.7*  HGB 7.9* 8.5*  HCT 25.3* 26.8*  PLT 297 329    Basename 01/05/12 0850 01/05/12 0714  NA 138 139  K 4.9 4.2  CL 98 99  CO2 30 30  GLUCOSE 146* 99  BUN 33* 32*  CREATININE 1.85* 1.83*  CALCIUM 9.6 9.1   No results found for this or any previous visit (from the past 240 hour(s)).   Studies/Results: No results found.  Medications: Scheduled Meds:    . aspirin EC  81 mg Oral Daily  . atorvastatin  20 mg Oral q1800  . budesonide  0.5 mg Nebulization BID  . clopidogrel  75 mg Oral Q breakfast  . diltiazem  240 mg Oral Daily  . ferrous sulfate  325 mg Oral BID WC  . heparin  5,000 Units Subcutaneous Q8H  . nebivolol  5 mg Oral Daily  . nicotine  21 mg Transdermal Q24H  . pantoprazole  40 mg Oral Q1200  . predniSONE  20 mg Oral Q breakfast  . senna-docusate  1 tablet Oral BID  . sodium polystyrene  15 g Oral Once  . tiotropium  18 mcg Inhalation Daily  . venlafaxine  75 mg Oral Daily   Continuous Infusions:  PRN Meds:.acetaminophen, albuterol, nitroGLYCERIN, ondansetron (ZOFRAN) IV    LOS: 4 days   Oather Muilenburg,CHRISTOPHER  [facility] Pager [phone]. If 8PM-8AM, please contact night-coverage at [email or web address], password [facility] 01/05/2012, 11:12 AM  LOS: 4 days

## 2012-01-05 NOTE — Progress Notes (Signed)
Loss of IV access at 1521.  New IV re-started. Notified Blood Bank of issue. Instructed to start doing hourly vitals starting from 1610.

## 2012-01-05 NOTE — Progress Notes (Signed)
   CARE MANAGEMENT NOTE 01/05/2012  Patient:  Wanda Yang, Wanda Yang   Account Number:  [id number]  Date Initiated:  01/05/2012  Documentation initiated by:  Tera Mater  Subjective/Objective Assessment:   71yo female admitted with Copd exacerbation with a CHF component.  Pt. lives with spouse at home.     Action/Plan:   Discharge planning for possible [facility] services   Anticipated DC Date:  01/06/2012   Anticipated DC Plan:  HOME W HOME HEALTH SERVICES      DC Planning Services  Medication Assistance      Choice offered to / List presented to:             Status of service:  In process, will continue to follow Medicare Important Message given?  NO (If response is "NO", the following Medicare IM given date fields will be blank) Date Medicare IM given:   Date Additional Medicare IM given:    Discharge Disposition:    Per UR Regulation:  Reviewed for med. necessity/level of care/duration of stay  If discussed at Long Length of Stay Meetings, dates discussed:    Comments:  01/05/12 1356 Tera Mater, RN, BSN NCM [phone] Primarily pt. being treated for COPD exacerbation, and is euvolemic.  Presently, pt. had a heme positive stool and HGB 7.9 and will receive 1 unit PRBC's today.   Will continue to monitor for discharge needs.

## 2012-01-05 NOTE — Progress Notes (Addendum)
PROGRESS NOTE  Subjective:   Wanda Yang is a 71 y.o. female with severe COPD, CAD, HTN, PVD, hyperlipidemia admitted to my service with progressive dyspnea.  She has continued to smoke.  She has moderate- severe anemia.  She is feeling a bit better.  She was seen by Int. Med. and was started on prednisone. She was to be discharged yesterday but this was cancelled due to worsening renal function and anemia.  She feels well this am   Objective:    Vital Signs:   Temp:  [97.2 F (36.2 C)-98.3 F (36.8 C)] 97.2 F (36.2 C) (08/01 0411) Pulse Rate:  [67-81] 81  (08/01 0411) Resp:  [20-22] 22  (08/01 0411) BP: (101-165)/(59-72) 101/72 mmHg (08/01 0411) SpO2:  [97 %-100 %] 97 % (08/01 0411) Weight:  [190 lb 3.2 oz (86.274 kg)] 190 lb 3.2 oz (86.274 kg) (08/01 0411)  Last BM Date: 01/04/12   24-hour weight change: Weight change: -6.4 oz (-0.181 kg)  Weight trends: Filed Weights   01/03/12 0548 01/04/12 0351 01/05/12 0411  Weight: 188 lb 6.4 oz (85.458 kg) 190 lb 9.6 oz (86.456 kg) 190 lb 3.2 oz (86.274 kg)    Intake/Output:  07/31 0701 - 08/01 0700 In: 1078 [P.O.:1078] Out: 800 [Urine:800] Total I/O In: 240 [P.O.:240] Out: 300 [Urine:300]   Physical Exam: BP 101/72  Pulse 81  Temp 97.2 F (36.2 C) (Axillary)  Resp 22  Ht 5\' 8"  (1.727 m)  Wt 190 lb 3.2 oz (86.274 kg)  BMI 28.92 kg/m2  SpO2 97%  General: Vital signs reviewed and noted. Well-developed, well-nourished, in no acute distress; alert, appropriate and cooperative .  Head: Normocephalic, atraumatic.  Eyes: conjunctivae/corneas clear.  EOM's intact.   Throat: normal  Neck: Supple. Normal carotids. No JVD  Lungs:  Decreased breath sounds in upper lung fields. Few wheezes  Heart: Regular rate,  With normal  S1 S2. No murmurs, gallops or rubs  Abdomen:  Soft, non-tender, non-distended with normoactive bowel sounds. No hepatomegaly. No rebound/guarding. No abdominal masses.  Extremities: Distal pedal pulses  are 2+ .  No edema.    Neurologic: A&O X3, CN II - XII are grossly intact. Motor strength is 5/5 in the all 4 extremities.  Psych: Responds to questions appropriately with normal affect.    Labs: BMET:  Basename 01/04/12 1606 01/04/12 0605  NA 139 140  K 5.7* 5.4*  CL 99 100  CO2 28 29  GLUCOSE 121* 100*  BUN 34* 31*  CREATININE 2.00* 2.05*  CALCIUM 9.7 9.4  MG -- --  PHOS -- --    Liver function tests: No results found for this basename: AST:2,ALT:2,ALKPHOS:2,BILITOT:2,PROT:2,ALBUMIN:2 in the last 72 hours No results found for this basename: LIPASE:2,AMYLASE:2 in the last 72 hours  CBC:  Basename 01/05/12 0714 01/04/12 1606  WBC 10.2 12.7*  NEUTROABS -- --  HGB 7.9* 8.5*  HCT 25.3* 26.8*  MCV 91.3 92.4  PLT 297 329    Cardiac Enzymes: No results found for this basename: CKTOTAL:4,CKMB:4,TROPONINI:4 in the last 72 hours  Coagulation Studies: No results found for this basename: LABPROT:5,INR:5 in the last 72 hours  No results found for this basename: TSH,T4TOTAL,FREET3,T3FREE,THYROIDAB in the last 72 hours  Basename 01/03/12 0537  VITAMINB12 261  FOLATE >20.0  FERRITIN 57  TIBC 323  IRON 27*  RETICCTPCT 2.4      Tele:  NSR  Medications:    Infusions:    Scheduled Medications:    . aspirin EC  81  mg Oral Daily  . atorvastatin  20 mg Oral q1800  . budesonide  0.5 mg Nebulization BID  . clopidogrel  75 mg Oral Q breakfast  . diltiazem  240 mg Oral Daily  . ferrous sulfate  325 mg Oral BID WC  . heparin  5,000 Units Subcutaneous Q8H  . nebivolol  5 mg Oral Daily  . nicotine  21 mg Transdermal Q24H  . pantoprazole  40 mg Oral Q1200  . predniSONE  20 mg Oral Q breakfast  . senna-docusate  1 tablet Oral BID  . sodium polystyrene  15 g Oral Once  . tiotropium  18 mcg Inhalation Daily  . venlafaxine  75 mg Oral Daily    Assessment/ Plan:   1. COPD:  I suspect this dyspnea  is primarily due to COPD in the face of significant anemia.    Her CXR  reveals hyperinflation and no significant pulmonary edema.   She had normal LV systolic function by echo and has mild diastolic dysfunction.  This admission was due to COPD exacerbation. Acute on chronic COPD.  2. Anemia:  This is certainly contributing to her dyspnea.  Will check stools. She has been on iron supplements.    3.  CAD (coronary artery disease)   she denies any angina, continue Plavix, ASA  4.  HYPERLIPIDEMIA (07/24/2008)   copntinue Atorvastatin  5.  HYPERTENSION (07/24/2008)   Stable , continue Diltiazem 240 CD  6.  Tobacco abuse    She continues to smoke and has severe COPD.  I have had many disscussions with her and she knows that she is harming herself.    7.  Renal insufficiency  Moderate renal insufficiency.  Creatinine is slightly higher after diuresis.  Holding further Lasix.  Recheck today.  Disposition:  Appreciate [facility] assistance in helping with these non cardiac issues.  Will ask Internal Medicine to assume care if she is not able to go home today as none of her issues during this hospitalization are cardiac.  BMP pending.  Stool sample sent to lab.   Length of Stay: 4  [name], [name]., MD, [facility] 01/05/2012, 8:21 AM Office [phone] Pager [phone]  Addendum:  She is feeling better.  Her creatinine is better.  The anemia is a chronic problem and will need to be addressed by her medical doctor.   I think she can go home today.

## 2012-01-05 NOTE — Progress Notes (Signed)
Loss of IV access at 1521.  New IV re-started. Notified Blood Bank of issue. Instructed to start doing hourly vitals starting from 1610. 

## 2012-01-05 NOTE — Discharge Summary (Signed)
Physician Discharge Summary  Wanda Yang AOZ:[id number] DOB: 01/09/1941 DOA: 01/01/2012  PCP: Henri Medal, MD  Admit date: 01/01/2012 Discharge date: 01/06/2012  Recommendations for Outpatient Follow-up:  1. Follow up with Dr Elease Hashimoto. Please recheck renal indices periodically. Diuretics are currently on hold.  2. Follow up with primary pulmonologist. Dr Marcelyn Bruins.  3. Follow up with gastroenterologist. 4.   Follow up with primary MD. Discharge Diagnoses:  Principal Problem:  *COPD (chronic obstructive pulmonary disease) Active Problems:  HYPERLIPIDEMIA  CAD (coronary artery disease)  Tobacco abuse  Renal insufficiency  Anemia   Discharge Condition: Satisfactory.  Diet recommendation: Heart-healthy.  Wt Readings from Last 3 Encounters:  01/06/12 85.503 kg (188 lb 8 oz)  09/20/11 89.721 kg (197 lb 12.8 oz)  07/29/11 91.082 kg (200 lb 12.8 oz)    History of present illness:  71 y.o. female, with contrast medical history of coronary artery disease, tobacco use, COPD multiple episodes of COPD exacerbation last before 2 weeks where she was tried on by mouth prednisone, presents with shortness of breath over the last 2 weeks. She is on baseline 3 L oxygen at home 24/7, reports a progression of her dyspnea mainly upon exertion, denies any orthopnea, denies any worsening of her cough or productive phlegm, reports mild lower extremity edema upon presentation, patient and her BNP was elevated upon admission at 1194, patient was initially admitted for CHF exacerbation, was started on diuresis. Medical consult was called for management of her COPD initially, then requested on 01/05/12, to take over care.   Hospital Course:  1. COPD exacerbation. Patient has advanced Oxygen-dependent COPD, which overall, appears to have progressed, against a background of continued smoking. She was managed with Oxygen supplementation, nebulizers, as well as a tapering course of steroids, and as of  01/04/12, appeared to have stabilized, and at her new baseline. She will follow up with her primary pulmonologist, Dr Marcelyn Bruins, on discharge.  2. Anemia: Patient has a normocytic anemia, with low iron level of 27, and ferritin of 8. Currently on iron supplement. According to her, she has had this or some years, and is already scheduled for outpatient colonoscopy this September. Hemoccult testing was positive. She was transfused on 01/05/12, with 1 unit of PRBC, for a hemoglobin of 7.9, as this was clearly contributory to her dyspnea and reduced effort tolerance. Hemoglobin was satisfactory at 10.2 on 01/06/12. 3. Coronary artery disease: No chest pain or clinical evidence of ACS during this hospitalization. She was continued on Aspirin Plavix and statin. Dr Elease Hashimoto has opined that patient's cardiovascular status is stable, and not contributory to her hospitalization.  4. Tobacco abuse: Patient unfortunately, continues to smoke. She was counseled appropriately, and managed with Nicoderm CQ patch.  5. Acute on chronic renal disease. Patient had a creatinine of 1.57 at presentation, against a background of baseline creatinine 1.5 in 02/2010, and atrophic left kidney.  She had a "bump" in creatinine up to 2.0 on 01/04/12, due to over-diuresis. Lasix was discontinued, with improvement in creatinine to 1.76 as of 01/06/12. Follow up of renal indices will be needed, after discharge.    Procedures:  See below  Consultations:  Dr Elease Hashimoto, cardiologist.  Discharge Exam: Filed Vitals:   01/06/12 1035  BP: 157/99  Pulse:   Temp:   Resp:    Filed Vitals:   01/05/12 2118 01/06/12 0419 01/06/12 0812 01/06/12 1035  BP:  166/80  157/99  Pulse:  83    Temp:  97.9 F (36.6 C)  TempSrc:  Oral    Resp:  20    Height:      Weight:  85.503 kg (188 lb 8 oz)    SpO2: 96% 94% 92%     General: Comfortable, alert, communicative, fully oriented, not short of breath at rest.  HEENT: Moderate clinical pallor, no  jaundice, no conjunctival injection or discharge.  NECK: Supple, JVP not seen, no carotid bruits, no palpable lymphadenopathy, no palpable goiter.  CHEST: Clinically clear to auscultation, no wheezes, no crackles.  HEART: Sounds 1 and 2 heard, normal, regular, no murmurs.  ABDOMEN: Full, soft, non-tender, no palpable organomegaly, no palpable masses, normal bowel sounds.  GENITALIA: Not examined.  LOWER EXTREMITIES: No pitting edema, palpable peripheral pulses.  MUSCULOSKELETAL SYSTEM: Generalized osteoarthritic changes, otherwise, normal.  CENTRAL NERVOUS SYSTEM: No focal neurologic deficit on gross examination.  Discharge Instructions  Discharge Orders    Future Appointments: Provider: Department: Dept Phone: Center:   01/27/2012 11:00 AM Barbaraann Share, MD [facility] [phone] None     Future Orders Please Complete By Expires   Diet - low sodium heart healthy      Increase activity slowly        Medication List  As of 01/06/2012 10:42 AM   STOP taking these medications         furosemide 40 MG tablet         TAKE these medications         albuterol 108 (90 BASE) MCG/ACT inhaler   Commonly known as: PROVENTIL HFA;VENTOLIN HFA   Inhale 2 puffs into the lungs every 4 (four) hours as needed.      allopurinol 100 MG tablet   Commonly known as: ZYLOPRIM   Take 100 mg by mouth 2 (two) times daily.      arformoterol 15 MCG/2ML Nebu   Commonly known as: BROVANA   Take 2 mLs (15 mcg total) by nebulization 2 (two) times daily.      aspirin 81 MG tablet   Take 81 mg by mouth daily.      budesonide 0.5 MG/2ML nebulizer solution   Commonly known as: PULMICORT   Take 2 mLs (0.5 mg total) by nebulization 2 (two) times daily.      Casanthranol-Docusate Sodium 30-100 MG Caps   Take 1 capsule by mouth as needed.      clopidogrel 75 MG tablet   Commonly known as: PLAVIX   TAKE 1 TABLET DAILY      diltiazem 240 MG 24 hr capsule   Commonly known as: DILACOR XR   Take 1  capsule (240 mg total) by mouth daily.      IRON PO   Take 65 mg by mouth daily.      nebivolol 5 MG tablet   Commonly known as: BYSTOLIC   Take 1 tablet (5 mg total) by mouth daily.      nitroGLYCERIN 0.4 MG SL tablet   Commonly known as: NITROSTAT   Place 0.4 mg under the tongue every 5 (five) minutes as needed.      predniSONE 10 MG tablet   Commonly known as: DELTASONE   Take 20 mg daily for 3 days, then 10 mg daily for 3 days, then stop.      rosuvastatin 10 MG tablet   Commonly known as: CRESTOR   Take 1 tablet (10 mg total) by mouth daily.      SPIRIVA HANDIHALER 18 MCG inhalation capsule   Generic drug: tiotropium   INHALE  THE CONTENTS OF 1   CAPSULE IN HANDIHALER DAILY      venlafaxine 75 MG tablet   Commonly known as: EFFEXOR   Take 75 mg by mouth daily.           Follow-up Information    Schedule an appointment as soon as possible for a visit with Henri Medal, MD.   Contact information:   [facility] [address] [place] [address] [phone]       Schedule an appointment as soon as possible for a visit with Barbaraann Share, MD.   Contact information:   [address] [employer] [place] [address] [phone]           The results of significant diagnostics from this hospitalization (including imaging, microbiology, ancillary and laboratory) are listed below for reference.    Significant Diagnostic Studies: Dg Chest 2 View  01/01/2012  *RADIOLOGY REPORT*  Clinical Data: Shortness of breath  CHEST - 2 VIEW  Comparison: 04/14/2008  Findings: Cardiomediastinal silhouette is stable.  No acute infiltrate or pleural effusion.  No pulmonary edema.  Stable hyperinflation and streaky bilateral basilar scarring. Mild degenerative changes thoracic spine.  IMPRESSION: No active disease.  Stable hyperinflation and bilateral basilar scarring.  Original Report Authenticated By: Natasha Mead, M.D.    Microbiology: No results found for this or any previous visit (from the past 240 hour(s)).   Labs: Basic Metabolic Panel:  Lab 01/06/12 8413 01/05/12 0850 01/05/12 0714 01/04/12 1606 01/04/12 0605 01/01/12 2012  NA 140 138 139 139 140 --  K 4.0 4.9 4.2 5.7* 5.4* --  CL 101 98 99 99 100 --  CO2 27 30 30 28 29  --  GLUCOSE 97 146* 99 121* 100* --  BUN 30* 33* 32* 34* 31* --  CREATININE 1.76* 1.85* 1.83* 2.00* 2.05* --  CALCIUM 9.4 9.6 9.1 9.7 9.4 --  MG -- -- -- -- -- 2.1  PHOS -- -- -- -- -- --   Liver Function Tests:  Lab 01/01/12 2012  AST 14  ALT 11  ALKPHOS 92  BILITOT 0.2*  PROT 7.2  ALBUMIN 3.0*   No results found for this basename: LIPASE:5,AMYLASE:5 in the last 168 hours No results found for this basename: AMMONIA:5 in the last 168 hours CBC:  Lab 01/06/12 0650 01/05/12 0714 01/04/12 1606 01/02/12 0500 01/01/12 1424  WBC 10.8* 10.2 12.7* 9.9 10.5  NEUTROABS -- -- -- 8.0* 8.8*  HGB 10.2* 7.9* 8.5* 8.0* 8.7*  HCT 31.6* 25.3* 26.8* 25.7* 27.5*  MCV 90.5 91.3 92.4 91.8 92.0  PLT 293 297 329 286 311   Cardiac Enzymes:  Lab 01/02/12 0655 01/01/12 1423  CKTOTAL 45 --  CKMB 1.8 --  CKMBINDEX -- --  TROPONINI <0.30 <0.30   BNP: BNP (last 3 results)  Basename 01/02/12 0821 01/01/12 1515  PROBNP 885.3* 1194.0*   CBG:  Lab 01/05/12 2157 01/05/12 1647 01/05/12 1112 01/05/12 0539 01/04/12 2147  GLUCAP 94 97 119* 90 112*    Time coordinating discharge: 45  minutes  Signed:  Girard Koontz,CHRISTOPHER  [facility] 01/06/2012, 10:42 AM

## 2012-01-06 LAB — CBC
Hemoglobin: 10.2 g/dL — ABNORMAL LOW (ref 12.0–15.0)
MCV: 90.5 fL (ref 78.0–100.0)
Platelets: 293 10*3/uL (ref 150–400)
RBC: 3.49 MIL/uL — ABNORMAL LOW (ref 3.87–5.11)
WBC: 10.8 10*3/uL — ABNORMAL HIGH (ref 4.0–10.5)

## 2012-01-06 LAB — TYPE AND SCREEN: ABO/RH(D): A NEG

## 2012-01-06 LAB — BASIC METABOLIC PANEL
CO2: 27 mEq/L (ref 19–32)
Calcium: 9.4 mg/dL (ref 8.4–10.5)
Potassium: 4 mEq/L (ref 3.5–5.1)
Sodium: 140 mEq/L (ref 135–145)

## 2012-01-06 MED ORDER — PREDNISONE 10 MG PO TABS: ORAL_TABLET | ORAL | Status: DC

## 2012-01-06 NOTE — Progress Notes (Signed)
All d/c instructions explained and given to pt by charge nurse Candi Leash, RN.  No further questions requested from nurse..  Husband at the bedside.  Pt d/c home via w/c asist form floor and husband will take her home.  Amanda Pea, Charity fundraiser.

## 2012-01-06 NOTE — Plan of Care (Signed)
Problem: Discharge Progression Outcomes Goal: Dyspnea controlled Outcome: Completed/Met Date Met:  01/06/12 Pt on home o2

## 2012-01-27 ENCOUNTER — Encounter: Payer: Self-pay | Admitting: Pulmonary Disease

## 2012-01-27 ENCOUNTER — Ambulatory Visit (INDEPENDENT_AMBULATORY_CARE_PROVIDER_SITE_OTHER): Payer: Federal, State, Local not specified - PPO | Admitting: Pulmonary Disease

## 2012-01-27 VITALS — BP 122/72 | HR 76 | Temp 98.1°F | Ht 68.5 in | Wt 194.8 lb

## 2012-01-27 DIAGNOSIS — J449 Chronic obstructive pulmonary disease, unspecified: Secondary | ICD-10-CM

## 2012-01-27 DIAGNOSIS — J9610 Chronic respiratory failure, unspecified whether with hypoxia or hypercapnia: Secondary | ICD-10-CM

## 2012-01-27 NOTE — Progress Notes (Signed)
  Subjective:    Patient ID: Wanda Yang, female    DOB: Dec 25, 1940, 71 y.o.   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  She unfortunately continues to smoke, and was recently in the hospital for a questionable exacerbation, but also was found to have severe anemia.  She was transfused, and is currently taking iron.  She has been maintaining on her bronchodilator regimen, but again continues to smoke.  She is not bringing up any purulent mucus, nor does she have significant chest congestion.  She continues to have dyspnea on exertion, but it is not far from her usual baseline.   Review of Systems  Constitutional: Negative for fever and unexpected weight change.  HENT: Positive for nosebleeds. Negative for ear pain, congestion, sore throat, rhinorrhea, sneezing, trouble swallowing, dental problem, postnasal drip and sinus pressure.   Eyes: Negative for redness and itching.  Respiratory: Positive for shortness of breath. Negative for cough, chest tightness and wheezing.   Cardiovascular: Positive for leg swelling. Negative for palpitations.  Gastrointestinal: Negative for nausea and vomiting.  Genitourinary: Negative for dysuria.  Musculoskeletal: Negative for joint swelling.  Skin: Negative for rash.  Neurological: Negative for headaches.  Hematological: Does not bruise/bleed easily.  Psychiatric/Behavioral: Negative for dysphoric mood. The patient is not nervous/anxious.   All other systems reviewed and are negative.       Objective:   Physical Exam Well-developed female in no acute distress Nose without purulence or discharge noted Chest with decreased breath sounds, but no wheezes or rhonchi. Cardiac exam with regular rate and rhythm Lower extremities with 1+ pedal edema, no cyanosis Alert and oriented, moves all 4 extremities.        Assessment & Plan:

## 2012-01-27 NOTE — Patient Instructions (Addendum)
Stop smoking.  We have no chance of keeping you out of the hospital if you do not stop completely. No change in breathing medications. Keep followup with your primary physician to make sure your anemia is better. followup with me in 6mos.

## 2012-01-27 NOTE — Assessment & Plan Note (Signed)
The patient was recently in the hospital with worsening dyspnea, felt secondary to a COPD exacerbation and also severe anemia with a hemoglobin of 8.  Unfortunately, the patient continues to smoke, and I have stressed to her that we cannot keep her well if she continues to do this.  She is on an excellent bronchodilator regimen, and there is really nothing else to add that is going to make a significant difference.  She is to continue on her current pulmonary medications, and work on total smoking cessation.  She will also need close followup by her primary physician for her anemia.

## 2012-01-31 ENCOUNTER — Other Ambulatory Visit: Payer: Self-pay | Admitting: Family Medicine

## 2012-01-31 DIAGNOSIS — D649 Anemia, unspecified: Secondary | ICD-10-CM

## 2012-01-31 DIAGNOSIS — R195 Other fecal abnormalities: Secondary | ICD-10-CM

## 2012-02-13 ENCOUNTER — Ambulatory Visit
Admission: RE | Admit: 2012-02-13 | Discharge: 2012-02-13 | Disposition: A | Discharge status: Home / Self Care | Payer: Federal, State, Local not specified - PPO | Source: Ambulatory Visit | Attending: Family Medicine | Admitting: Family Medicine

## 2012-02-13 DIAGNOSIS — D649 Anemia, unspecified: Secondary | ICD-10-CM

## 2012-02-13 DIAGNOSIS — R195 Other fecal abnormalities: Secondary | ICD-10-CM

## 2012-02-21 ENCOUNTER — Encounter: Payer: Self-pay | Admitting: Cardiovascular Disease

## 2012-03-20 ENCOUNTER — Encounter: Payer: Self-pay | Admitting: Cardiovascular Disease

## 2012-03-20 ENCOUNTER — Ambulatory Visit (INDEPENDENT_AMBULATORY_CARE_PROVIDER_SITE_OTHER): Payer: Federal, State, Local not specified - PPO | Admitting: Cardiovascular Disease

## 2012-03-20 VITALS — BP 100/76 | HR 70 | Ht 68.5 in | Wt 193.0 lb

## 2012-03-20 DIAGNOSIS — I2510 Atherosclerotic heart disease of native coronary artery without angina pectoris: Secondary | ICD-10-CM

## 2012-03-20 DIAGNOSIS — I739 Peripheral vascular disease, unspecified: Secondary | ICD-10-CM

## 2012-03-20 DIAGNOSIS — I25810 Atherosclerosis of coronary artery bypass graft(s) without angina pectoris: Secondary | ICD-10-CM

## 2012-03-20 NOTE — Patient Instructions (Addendum)
Your physician has requested that you have a lower or upper extremity arterial duplex. This test is an ultrasound of the arteries in the legs or arms. It looks at arterial blood flow in the legs and arms. Allow one hour for Lower and Upper Arterial scans. There are no restrictions or special instructions  Your physician wants you to follow-up in: 6 MONTHS You will receive a reminder letter in the mail two months in advance. If you don't receive a letter, please call our office to schedule the follow-up appointment.  Your physician recommends that you return for a FASTING lipid profile: 6 MONTHS

## 2012-03-20 NOTE — Assessment & Plan Note (Signed)
She has not had any episodes of angina. We will continue her same medications. She has had her lipid levels checked at her general medical doctors office recently. We will have them fax those numbers to [place]. I'll see her again in 6 months. We'll check her fasting lipids, hepatic profile, and basic metabolic profile again at that time.

## 2012-03-20 NOTE — Progress Notes (Signed)
Wanda Yang Date of Birth  [birth year] [facility] [address]    [address] [place], [place]  [address] [phone]  Fax  [phone]  Problem: 1. Coronary artery disease-status post PTCA and stenting of her left complex artery as well as right coronary artery 2. Abdominal aortic ulceration/atherosclerosis 3. Continued cigarette use-even while using home oxygen 4. Hypertension 5. COPD 6. Gout- becomes dyspneic with allopurinol  History of Present Illness:  71 yo female with hx of CAD - s/p PCI.  She has a significant history of COPD.  she continues to smoke. She quit for about 4 weeks but unfortunately started back.  Has a history of hypertension. Her blood pressure readings have been low separately.   She has a history of hyperlipidemia. Her medical doctor increased her Crestor to 40 mg a day at her last visit.  She is having some gout pain in her feet but the crestor did not seem to worsen these pains.  She complains of leg fatigue. She does not have any claudication.   She also complains of burning in both her feet and toes.  She also has had some cyanosis in her toes.  She's been wearing compression hose and this seems to help herdecrease the swelling and cyanosis in her feet and toes.   She also has a history of an abdominal aortic ulceration.  She continues to have significant dyspnea. She is very short of breath when she wakes up in the morning that she gradually feels better and is able to catch her breath.  She's been taking some allopurinol but apparently this makes her even more short of breath.  Current Outpatient Prescriptions on File Prior to Visit  Medication Sig Dispense Refill  . albuterol (PROVENTIL HFA;VENTOLIN HFA) 108 (90 BASE) MCG/ACT inhaler Inhale 2 puffs into the lungs every 4 (four) hours as needed.  1 Inhaler  5  . allopurinol (ZYLOPRIM) 100 MG tablet Take 100 mg by mouth 2 (two) times daily.        Marland Kitchen arformoterol (BROVANA) 15 MCG/2ML NEBU  Take 2 mLs (15 mcg total) by nebulization 2 (two) times daily.  360 mL  3  . aspirin 81 MG tablet Take 81 mg by mouth daily.        . budesonide (PULMICORT) 0.5 MG/2ML nebulizer solution Take 2 mLs (0.5 mg total) by nebulization 2 (two) times daily.  360 mL  3  . Casanthranol-Docusate Sodium 30-100 MG CAPS Take 1 capsule by mouth as needed.       . clopidogrel (PLAVIX) 75 MG tablet TAKE 1 TABLET DAILY  90 tablet  3  . diltiazem (DILACOR XR) 240 MG 24 hr capsule Take 1 capsule (240 mg total) by mouth daily.  90 capsule  3  . furosemide (LASIX) 40 MG tablet Take 1 tablet by mouth daily.      . IRON PO Take 130 mg by mouth daily.       . nebivolol (BYSTOLIC) 5 MG tablet Take 1 tablet (5 mg total) by mouth daily.  30 tablet  6  . nitroGLYCERIN (NITROSTAT) 0.4 MG SL tablet Place 0.4 mg under the tongue every 5 (five) minutes as needed.        Marland Kitchen SPIRIVA HANDIHALER 18 MCG inhalation capsule INHALE THE CONTENTS OF 1   CAPSULE IN HANDIHALER DAILY  90 capsule  3  . venlafaxine (EFFEXOR) 75 MG tablet Take 75 mg by mouth daily.       Marland Kitchen DISCONTD: rosuvastatin (CRESTOR)  10 MG tablet Take 1 tablet (10 mg total) by mouth daily.  90 tablet  3    Allergies  Allergen Reactions  . Uloric (Febuxostat)   . Vicodin (Hydrocodone-Acetaminophen)   . Zolpidem Tartrate   . Tetracycline Rash    Past Medical History  Diagnosis Date  . CAD (coronary artery disease)   . Atherosclerosis of abdominal aorta   . On home oxygen therapy   . Tobacco abuse   . HTN (hypertension)   . COPD (chronic obstructive pulmonary disease)   . PVD (peripheral vascular disease)   . Anxiety   . GERD (gastroesophageal reflux disease)   . RBBB (right bundle branch block)   . Tachycardia   . High cholesterol   . Renal insufficiency   . Leukocytosis   . Endometrial carcinoma     Past Surgical History  Procedure Date  . Coronary angioplasty   . Abdominal hysterectomy   . Carotid stent   . Tonsillectomy   . Arm surgery      PLATES AND SCREWS  . Breast reduction surgery   . Femoral artery stent     History  Smoking status  . Current Every Day Smoker -- 1.0 packs/day for 53 years  . Types: Cigarettes  Smokeless tobacco  . Not on file  Comment: currently smoking 3/4 ppd.     History  Alcohol Use  . Yes    occassional    Family History  Problem Relation Age of Onset  . Heart attack Father   . Coronary artery disease Father   . Leukemia Mother   . Leukemia Brother     Reviw of Systems:  Reviewed in the HPI.  All other systems are negative.  Physical Exam: BP 100/76  Pulse 70  Ht 5' 8.5" (1.74 m)  Wt 193 lb (87.544 kg)  BMI 28.92 kg/m2 The patient is alert and oriented x 3.  The mood and affect are normal.   Skin: warm and dry.  Color is normal.    HEENT:   Normal carotids, no jvd  Lungs: decreased breath sounds in the upper lung fields   Heart: RR    Abdomen: non tender, normal BS  Extremities:  Trace- 1-2+ edema  Neuro:  Normal.    EKG: 03/20/2012-normal sinus rhythm at 71 beats a minute. She has a right bundle branch block.  Assessment / Plan:

## 2012-03-20 NOTE — Assessment & Plan Note (Signed)
Wanda Yang continues to have pain and burning in her feet. Some of these symptoms sound consistent with claudication. She does not get any exercise but does have these symptoms at rest.  She had a lower extremity duplex scan in 2009 which revealed moderate peripheral vascular disease. She has continued to smoke and it is quite possible that she has had worsening peripheral vascular disease.  We'll schedule her for repeat lower extremity duplex scan.  Once again I have tried to convince her to stop smoking.

## 2012-03-26 ENCOUNTER — Encounter: Payer: Self-pay | Admitting: Cardiovascular Disease

## 2012-04-03 ENCOUNTER — Other Ambulatory Visit: Payer: Self-pay | Admitting: Cardiology

## 2012-04-03 ENCOUNTER — Encounter (INDEPENDENT_AMBULATORY_CARE_PROVIDER_SITE_OTHER): Payer: Federal, State, Local not specified - PPO

## 2012-04-03 DIAGNOSIS — I2510 Atherosclerotic heart disease of native coronary artery without angina pectoris: Secondary | ICD-10-CM

## 2012-04-03 DIAGNOSIS — I739 Peripheral vascular disease, unspecified: Secondary | ICD-10-CM

## 2012-04-06 ENCOUNTER — Encounter: Payer: Self-pay | Admitting: Vascular Surgery

## 2012-04-27 ENCOUNTER — Other Ambulatory Visit: Payer: Self-pay | Admitting: *Deleted

## 2012-04-27 MED ORDER — BUDESONIDE 0.5 MG/2ML IN SUSP: 0.5000 mg | Freq: Two times a day (BID) | RESPIRATORY_TRACT | Status: DC

## 2012-04-27 MED ORDER — ARFORMOTEROL TARTRATE 15 MCG/2ML IN NEBU: 15.0000 ug | INHALATION_SOLUTION | Freq: Two times a day (BID) | RESPIRATORY_TRACT | Status: DC

## 2012-06-07 ENCOUNTER — Telehealth: Payer: Self-pay | Admitting: Pulmonary Disease

## 2012-06-07 MED ORDER — ALBUTEROL SULFATE HFA 108 (90 BASE) MCG/ACT IN AERS: 2.0000 | INHALATION_SPRAY | RESPIRATORY_TRACT | Status: DC | PRN

## 2012-06-07 NOTE — Telephone Encounter (Signed)
Rx has been sent in, pt is aware. 

## 2012-07-30 ENCOUNTER — Ambulatory Visit: Payer: Federal, State, Local not specified - PPO | Admitting: Pulmonary Disease

## 2012-08-01 ENCOUNTER — Encounter: Payer: Self-pay | Admitting: Pulmonary Disease

## 2012-08-01 ENCOUNTER — Ambulatory Visit (INDEPENDENT_AMBULATORY_CARE_PROVIDER_SITE_OTHER): Payer: Federal, State, Local not specified - PPO | Admitting: Pulmonary Disease

## 2012-08-01 VITALS — BP 122/82 | HR 67 | Temp 97.7°F | Ht 68.0 in | Wt 192.2 lb

## 2012-08-01 DIAGNOSIS — J449 Chronic obstructive pulmonary disease, unspecified: Secondary | ICD-10-CM

## 2012-08-01 MED ORDER — ALBUTEROL SULFATE HFA 108 (90 BASE) MCG/ACT IN AERS: 2.0000 | INHALATION_SPRAY | RESPIRATORY_TRACT | Status: DC | PRN

## 2012-08-01 MED ORDER — TIOTROPIUM BROMIDE MONOHYDRATE 18 MCG IN CAPS: 18.0000 ug | ORAL_CAPSULE | Freq: Every day | RESPIRATORY_TRACT | Status: DC

## 2012-08-01 NOTE — Patient Instructions (Addendum)
Stay on brovana and budesonide (pulmicort) TWICE a day.   Take spiriva ONCE each am Stay on oxygen, and work on daily activity. Stop smoking!  This is your most important treatment.  I will send an order to your DME for new supplies for neb machine.  followup with me in 6mos.

## 2012-08-01 NOTE — Assessment & Plan Note (Signed)
The patient has known COPD, and unfortunately is still smoking and not taking her medications appropriately.  At least she has not had a significant acute exacerbation since the last visit.  I have gone over her medications with her again, and has to take them appropriately.  I have also stressed to her the importance of total smoking cessation.

## 2012-08-01 NOTE — Addendum Note (Signed)
Addended by: Nita Sells on: 08/01/2012 12:11 PM   Modules accepted: Orders

## 2012-08-01 NOTE — Progress Notes (Signed)
  Subjective:    Patient ID: Wanda Yang, female    DOB: January 11, 1941, 72 y.o.   MRN: [id number]  HPI The patient comes in today for followup of her known COPD.  Unfortunately, she continues to smoke, and also has not been taking her medications appropriately.  She has only been using her nebulizer treatment once a day, and has also run out of her Spiriva.  She has seen increasing shortness of breath since that time.  She denies any significant purulent mucous or congestion, and has not had an acute exacerbation since her last visit.   Review of Systems  Constitutional: Negative for fever and unexpected weight change.  HENT: Positive for congestion, rhinorrhea and postnasal drip. Negative for ear pain, nosebleeds, sore throat, sneezing, trouble swallowing, dental problem and sinus pressure.   Eyes: Negative for redness and itching.  Respiratory: Positive for cough and shortness of breath. Negative for chest tightness and wheezing.   Cardiovascular: Positive for leg swelling. Negative for palpitations.  Gastrointestinal: Negative for nausea and vomiting.  Genitourinary: Negative for dysuria.  Musculoskeletal: Negative for joint swelling.  Skin: Negative for rash.  Neurological: Negative for headaches.  Hematological: Does not bruise/bleed easily.  Psychiatric/Behavioral: Negative for dysphoric mood. The patient is nervous/anxious.        Objective:   Physical Exam Well-developed female in no acute distress Nose without purulence or discharge noted Neck without lymphadenopathy or thyromegaly Chest with decreased breath sounds throughout, no active wheezing or rhonchi Cardiac exam with regular rate and rhythm Lower extremities with mild edema, no cyanosis Alert and oriented, moves all 4 extremities.       Assessment & Plan:

## 2012-08-06 ENCOUNTER — Other Ambulatory Visit: Payer: Self-pay | Admitting: *Deleted

## 2012-08-06 MED ORDER — CLOPIDOGREL BISULFATE 75 MG PO TABS: 75.0000 mg | ORAL_TABLET | Freq: Every day | ORAL | Status: DC

## 2012-08-06 NOTE — Telephone Encounter (Signed)
Fax Received. Refill Completed. Wanda Yang (M.A)  

## 2012-09-25 ENCOUNTER — Other Ambulatory Visit: Payer: Self-pay | Admitting: *Deleted

## 2012-09-25 MED ORDER — DILTIAZEM HCL ER 240 MG PO CP24: 240.0000 mg | ORAL_CAPSULE | Freq: Every day | ORAL | Status: DC

## 2012-09-25 NOTE — Telephone Encounter (Signed)
Fax Received. Refill Completed. Wanda Yang (R.M.A)   

## 2012-09-30 ENCOUNTER — Other Ambulatory Visit: Payer: Self-pay | Admitting: Pulmonary Disease

## 2012-10-01 MED ORDER — ARFORMOTEROL TARTRATE 15 MCG/2ML IN NEBU: 15.0000 ug | INHALATION_SOLUTION | Freq: Two times a day (BID) | RESPIRATORY_TRACT | Status: DC

## 2012-10-02 ENCOUNTER — Ambulatory Visit (INDEPENDENT_AMBULATORY_CARE_PROVIDER_SITE_OTHER): Payer: Federal, State, Local not specified - PPO | Admitting: Cardiovascular Disease

## 2012-10-02 ENCOUNTER — Encounter: Payer: Self-pay | Admitting: Cardiovascular Disease

## 2012-10-02 ENCOUNTER — Other Ambulatory Visit (INDEPENDENT_AMBULATORY_CARE_PROVIDER_SITE_OTHER): Payer: Federal, State, Local not specified - PPO

## 2012-10-02 VITALS — BP 116/80 | HR 71 | Ht 68.0 in | Wt 190.8 lb

## 2012-10-02 DIAGNOSIS — Z720 Tobacco use: Secondary | ICD-10-CM

## 2012-10-02 DIAGNOSIS — F17200 Nicotine dependence, unspecified, uncomplicated: Secondary | ICD-10-CM

## 2012-10-02 DIAGNOSIS — E785 Hyperlipidemia, unspecified: Secondary | ICD-10-CM

## 2012-10-02 DIAGNOSIS — I2510 Atherosclerotic heart disease of native coronary artery without angina pectoris: Secondary | ICD-10-CM

## 2012-10-02 LAB — BASIC METABOLIC PANEL
BUN: 26 mg/dL — ABNORMAL HIGH (ref 6–23)
Calcium: 8.8 mg/dL (ref 8.4–10.5)
Creatinine, Ser: 1.9 mg/dL — ABNORMAL HIGH (ref 0.4–1.2)
GFR: 28.17 mL/min — ABNORMAL LOW (ref >60.00)

## 2012-10-02 LAB — HEPATIC FUNCTION PANEL
ALT: 8 U/L (ref 0–35)
Alkaline Phosphatase: 84 U/L (ref 39–117)
Bilirubin, Direct: 0 mg/dL (ref 0.0–0.3)
Total Protein: 7.4 g/dL (ref 6.0–8.3)

## 2012-10-02 LAB — LIPID PANEL
Cholesterol: 128 mg/dL (ref 0–200)
LDL Cholesterol: 52 mg/dL (ref 0–99)

## 2012-10-02 MED ORDER — NITROGLYCERIN 0.4 MG SL SUBL: 0.4000 mg | SUBLINGUAL_TABLET | SUBLINGUAL | Status: DC | PRN

## 2012-10-02 NOTE — Progress Notes (Signed)
Wanda Yang Date of Birth  [birth year] [facility] [address]    [address] [place], [place]  [address] [phone]  Fax  [phone]  Problem: 1. Coronary artery disease-status post PTCA and stenting of her left complex artery as well as right coronary artery 2. Abdominal aortic ulceration/atherosclerosis 3. Continued cigarette use-even while using home oxygen 4. Hypertension 5. COPD 6. Gout- becomes dyspneic with allopurinol 7. PVD,  S/p iliac stenting.  History of Present Illness:  72 yo female with hx of CAD - s/p PCI.  She has a significant history of COPD.  she continues to smoke. She quit for about 4 weeks but unfortunately started back.  Has a history of hypertension. Her blood pressure readings have been low separately.   She has a history of hyperlipidemia. Her medical doctor increased her Crestor to 40 mg a day at her last visit.  She is having some gout pain in her feet but the crestor did not seem to worsen these pains.  She complains of leg fatigue. She does not have any claudication.   She also complains of burning in both her feet and toes.  She also has had some cyanosis in her toes.  She's been wearing compression hose and this seems to help herdecrease the swelling and cyanosis in her feet and toes.   She also has a history of an abdominal aortic ulceration.  She continues to have significant dyspnea. She is very short of breath when she wakes up in the morning that she gradually feels better and is able to catch her breath.  She's been taking some allopurinol but apparently this makes her even more short of breath.  October 02, 2012:  Sheriann is having some chest tightness.  She has trouble breathing on rainy days.  The discomfort is not similar to her angina and she has not taken a NTG.    She decreased her crestor back down to to 20 mg a day because of arm weakness and soreness.    Current Outpatient Prescriptions on File Prior to Visit   Medication Sig Dispense Refill  . albuterol (PROVENTIL HFA;VENTOLIN HFA) 108 (90 BASE) MCG/ACT inhaler Inhale 2 puffs into the lungs every 4 (four) hours as needed.  1 Inhaler  3  . allopurinol (ZYLOPRIM) 100 MG tablet Take 100 mg by mouth 2 (two) times daily.        Marland Kitchen arformoterol (BROVANA) 15 MCG/2ML NEBU Take 2 mLs (15 mcg total) by nebulization 2 (two) times daily. DX  496  360 mL  3  . aspirin 81 MG tablet Take 81 mg by mouth daily.        . budesonide (PULMICORT) 0.5 MG/2ML nebulizer solution Take 2 mLs (0.5 mg total) by nebulization 2 (two) times daily. DX  496  360 mL  1  . clopidogrel (PLAVIX) 75 MG tablet Take 1 tablet (75 mg total) by mouth daily.  90 tablet  3  . diltiazem (DILACOR XR) 240 MG 24 hr capsule Take 1 capsule (240 mg total) by mouth daily.  90 capsule  3  . famotidine (PEPCID) 20 MG tablet Take 20 mg by mouth as needed for heartburn.      . furosemide (LASIX) 40 MG tablet Take 40 mg by mouth daily.       . IRON PO Take 130 mg by mouth 2 (two) times daily.       . nebivolol (BYSTOLIC) 5 MG tablet Take 1 tablet (5 mg total) by  mouth daily.  30 tablet  6  . nitroGLYCERIN (NITROSTAT) 0.4 MG SL tablet Place 0.4 mg under the tongue every 5 (five) minutes as needed.        . rosuvastatin (CRESTOR) 10 MG tablet Take 20 mg by mouth daily.       Marland Kitchen tiotropium (SPIRIVA HANDIHALER) 18 MCG inhalation capsule Place 1 capsule (18 mcg total) into inhaler and inhale daily.  90 capsule  3  . venlafaxine (EFFEXOR) 75 MG tablet Take 75 mg by mouth daily.       . vitamin B-12 (CYANOCOBALAMIN) 1000 MCG tablet Take 1,000 mcg by mouth daily.       No current facility-administered medications on file prior to visit.    Allergies  Allergen Reactions  . Uloric (Febuxostat)   . Vicodin (Hydrocodone-Acetaminophen)   . Zolpidem Tartrate   . Tetracycline Rash    Past Medical History  Diagnosis Date  . CAD (coronary artery disease)   . Atherosclerosis of abdominal aorta   . On home oxygen  therapy   . Tobacco abuse   . HTN (hypertension)   . COPD (chronic obstructive pulmonary disease)   . PVD (peripheral vascular disease)   . Anxiety   . GERD (gastroesophageal reflux disease)   . RBBB (right bundle branch block)   . Tachycardia   . High cholesterol   . Renal insufficiency   . Leukocytosis   . Endometrial carcinoma     Past Surgical History  Procedure Laterality Date  . Coronary angioplasty    . Abdominal hysterectomy    . Carotid stent    . Tonsillectomy    . Arm surgery      PLATES AND SCREWS  . Breast reduction surgery    . Femoral artery stent      History  Smoking status  . Current Every Day Smoker -- 1.00 packs/day for 53 years  . Types: Cigarettes  Smokeless tobacco  . Not on file    Comment: currently smoking 3/4 ppd.     History  Alcohol Use  . Yes    Comment: occassional    Family History  Problem Relation Age of Onset  . Heart attack Father   . Coronary artery disease Father   . Leukemia Mother   . Leukemia Brother     Reviw of Systems:  Reviewed in the HPI.  All other systems are negative.  Physical Exam: BP 116/80  Pulse 71  Ht 5\' 8"  (1.727 m)  Wt 190 lb 12.8 oz (86.546 kg)  BMI 29.02 kg/m2  SpO2 97% The patient is alert and oriented x 3.  The mood and affect are normal.   Skin: warm and dry.  Color is normal.    HEENT:   Normal carotids, no jvd  Lungs: decreased breath sounds in the upper lung fields   Heart: RR    Abdomen: non tender, normal BS  Extremities:  Trace- 1-2+ edema  Neuro:  Normal.    EKG: 03/20/2012-normal sinus rhythm at 71 beats a minute. She has a right bundle branch block.  Assessment / Plan:

## 2012-10-02 NOTE — Patient Instructions (Addendum)
Fasting lab work today   Your physician wants you to follow-up in: 6 months with fasting lab work. You will receive a reminder letter in the mail two months in advance. If you don't receive a letter, please call our office to schedule the follow-up appointment.

## 2012-10-02 NOTE — Assessment & Plan Note (Signed)
She was unable to take Crestor 40 mg a day because of muscle aches in her arms. She has reduced her dose back down to 20 mg a day. We'll recheck her fasting labs today and again in 6 months we'll see her for an office visit.

## 2012-10-02 NOTE — Assessment & Plan Note (Signed)
Avigayil is feeling well. She continues to have problems with her COPD. I do not think that she's having episodes of angina. She does describe some chest tightness she thinks it is more related to find. All rainy day. She's typically have problems breathing on her right knee. We will refill her nitroglycerin. I have asked her to call me if she has any further angina pains.

## 2012-10-02 NOTE — Assessment & Plan Note (Signed)
She continues to smoke.  i have encouraged her to stop on many occasions.

## 2012-10-10 ENCOUNTER — Other Ambulatory Visit: Payer: Self-pay

## 2012-10-10 DIAGNOSIS — Z720 Tobacco use: Secondary | ICD-10-CM

## 2012-10-10 DIAGNOSIS — I2510 Atherosclerotic heart disease of native coronary artery without angina pectoris: Secondary | ICD-10-CM

## 2012-10-10 DIAGNOSIS — E785 Hyperlipidemia, unspecified: Secondary | ICD-10-CM

## 2012-10-10 MED ORDER — NITROGLYCERIN 0.4 MG SL SUBL: 0.4000 mg | SUBLINGUAL_TABLET | SUBLINGUAL | Status: DC | PRN

## 2012-11-01 ENCOUNTER — Inpatient Hospital Stay (HOSPITAL_COMMUNITY)
Admission: EM | Admit: 2012-11-01 | Discharge: 2012-11-05 | DRG: 541 | Disposition: A | Discharge status: Home / Self Care | Payer: Federal, State, Local not specified - PPO | Attending: Family Medicine | Admitting: Family Medicine

## 2012-11-01 ENCOUNTER — Emergency Department (HOSPITAL_COMMUNITY): Payer: Federal, State, Local not specified - PPO

## 2012-11-01 ENCOUNTER — Inpatient Hospital Stay (HOSPITAL_COMMUNITY): Payer: Federal, State, Local not specified - PPO

## 2012-11-01 ENCOUNTER — Encounter (HOSPITAL_COMMUNITY): Payer: Self-pay | Admitting: Nurse Practitioner

## 2012-11-01 DIAGNOSIS — I509 Heart failure, unspecified: Secondary | ICD-10-CM | POA: Diagnosis present

## 2012-11-01 DIAGNOSIS — Z9981 Dependence on supplemental oxygen: Secondary | ICD-10-CM

## 2012-11-01 DIAGNOSIS — IMO0002 Reserved for concepts with insufficient information to code with codable children: Secondary | ICD-10-CM

## 2012-11-01 DIAGNOSIS — R319 Hematuria, unspecified: Secondary | ICD-10-CM | POA: Diagnosis present

## 2012-11-01 DIAGNOSIS — I739 Peripheral vascular disease, unspecified: Secondary | ICD-10-CM | POA: Diagnosis present

## 2012-11-01 DIAGNOSIS — J189 Pneumonia, unspecified organism: Principal | ICD-10-CM | POA: Diagnosis present

## 2012-11-01 DIAGNOSIS — D649 Anemia, unspecified: Secondary | ICD-10-CM | POA: Diagnosis present

## 2012-11-01 DIAGNOSIS — F17200 Nicotine dependence, unspecified, uncomplicated: Secondary | ICD-10-CM | POA: Diagnosis present

## 2012-11-01 DIAGNOSIS — R609 Edema, unspecified: Secondary | ICD-10-CM | POA: Diagnosis present

## 2012-11-01 DIAGNOSIS — I2510 Atherosclerotic heart disease of native coronary artery without angina pectoris: Secondary | ICD-10-CM | POA: Diagnosis present

## 2012-11-01 DIAGNOSIS — J4489 Other specified chronic obstructive pulmonary disease: Secondary | ICD-10-CM | POA: Diagnosis present

## 2012-11-01 DIAGNOSIS — J9620 Acute and chronic respiratory failure, unspecified whether with hypoxia or hypercapnia: Secondary | ICD-10-CM | POA: Diagnosis present

## 2012-11-01 DIAGNOSIS — K219 Gastro-esophageal reflux disease without esophagitis: Secondary | ICD-10-CM | POA: Diagnosis present

## 2012-11-01 DIAGNOSIS — J449 Chronic obstructive pulmonary disease, unspecified: Secondary | ICD-10-CM | POA: Diagnosis present

## 2012-11-01 DIAGNOSIS — Z66 Do not resuscitate: Secondary | ICD-10-CM | POA: Diagnosis present

## 2012-11-01 DIAGNOSIS — Z7982 Long term (current) use of aspirin: Secondary | ICD-10-CM

## 2012-11-01 DIAGNOSIS — D126 Benign neoplasm of colon, unspecified: Secondary | ICD-10-CM | POA: Diagnosis present

## 2012-11-01 DIAGNOSIS — I10 Essential (primary) hypertension: Secondary | ICD-10-CM | POA: Diagnosis present

## 2012-11-01 DIAGNOSIS — E871 Hypo-osmolality and hyponatremia: Secondary | ICD-10-CM | POA: Diagnosis present

## 2012-11-01 DIAGNOSIS — I130 Hypertensive heart and chronic kidney disease with heart failure and stage 1 through stage 4 chronic kidney disease, or unspecified chronic kidney disease: Secondary | ICD-10-CM | POA: Diagnosis present

## 2012-11-01 DIAGNOSIS — N179 Acute kidney failure, unspecified: Secondary | ICD-10-CM | POA: Diagnosis present

## 2012-11-01 DIAGNOSIS — D72829 Elevated white blood cell count, unspecified: Secondary | ICD-10-CM | POA: Diagnosis present

## 2012-11-01 DIAGNOSIS — T368X5A Adverse effect of other systemic antibiotics, initial encounter: Secondary | ICD-10-CM | POA: Diagnosis present

## 2012-11-01 DIAGNOSIS — N189 Chronic kidney disease, unspecified: Secondary | ICD-10-CM | POA: Diagnosis present

## 2012-11-01 HISTORY — DX: Acute myocardial infarction, unspecified: I21.9

## 2012-11-01 HISTORY — DX: Urinary tract infection, site not specified: N39.0

## 2012-11-01 HISTORY — DX: Heart failure, unspecified: I50.9

## 2012-11-01 HISTORY — DX: Shortness of breath: R06.02

## 2012-11-01 LAB — CBC
Hemoglobin: 8.6 g/dL — ABNORMAL LOW (ref 12.0–15.0)
MCH: 28.1 pg (ref 26.0–34.0)
MCV: 85.5 fL (ref 78.0–100.0)
Platelets: 268 10*3/uL (ref 150–400)
Platelets: 244 10*3/uL (ref 150–400)
RBC: 3.05 MIL/uL — ABNORMAL LOW (ref 3.87–5.11)
RDW: 16 % — ABNORMAL HIGH (ref 11.5–15.5)
WBC: 13.7 10*3/uL — ABNORMAL HIGH (ref 4.0–10.5)

## 2012-11-01 LAB — CREATININE, SERUM
Creatinine, Ser: 3.8 mg/dL — ABNORMAL HIGH (ref 0.50–1.10)
GFR calc non Af Amer: 11 mL/min — ABNORMAL LOW (ref >90)

## 2012-11-01 LAB — BASIC METABOLIC PANEL
Calcium: 9.3 mg/dL (ref 8.4–10.5)
Creatinine, Ser: 3.6 mg/dL — ABNORMAL HIGH (ref 0.50–1.10)
GFR calc Af Amer: 14 mL/min — ABNORMAL LOW (ref >90)
GFR calc non Af Amer: 12 mL/min — ABNORMAL LOW (ref >90)

## 2012-11-01 LAB — TROPONIN I
Troponin I: <0.3 ng/mL (ref <0.30)
Troponin I: <0.3 ng/mL (ref <0.30)

## 2012-11-01 LAB — URINALYSIS, ROUTINE W REFLEX MICROSCOPIC
Bilirubin Urine: NEGATIVE
Glucose, UA: NEGATIVE mg/dL
Protein, ur: NEGATIVE mg/dL
Urobilinogen, UA: 0.2 mg/dL (ref 0.0–1.0)

## 2012-11-01 LAB — URINE MICROSCOPIC-ADD ON

## 2012-11-01 LAB — POCT I-STAT TROPONIN I: Troponin i, poc: 0 ng/mL (ref 0.00–0.08)

## 2012-11-01 MED ORDER — ACETAMINOPHEN 325 MG PO TABS: 650.0000 mg | ORAL_TABLET | Freq: Four times a day (QID) | ORAL | Status: DC | PRN

## 2012-11-01 MED ORDER — GUAIFENESIN-DM 100-10 MG/5ML PO SYRP: 5.0000 mL | ORAL_SOLUTION | ORAL | Status: DC | PRN

## 2012-11-01 MED ORDER — ALBUTEROL (5 MG/ML) CONTINUOUS INHALATION SOLN
10.0000 mg/h | INHALATION_SOLUTION | Freq: Once | RESPIRATORY_TRACT | Status: AC
  Administered 2012-11-01: 10 mg/h via RESPIRATORY_TRACT
  Filled 2012-11-01: qty 20

## 2012-11-01 MED ORDER — BUDESONIDE 0.5 MG/2ML IN SUSP
0.5000 mg | Freq: Two times a day (BID) | RESPIRATORY_TRACT | Status: DC
  Administered 2012-11-01 – 2012-11-05 (×8): 0.5 mg via RESPIRATORY_TRACT
  Filled 2012-11-01 (×9): qty 2

## 2012-11-01 MED ORDER — ALLOPURINOL 100 MG PO TABS
100.0000 mg | ORAL_TABLET | Freq: Every day | ORAL | Status: DC
  Administered 2012-11-01 – 2012-11-05 (×5): 100 mg via ORAL
  Filled 2012-11-01 (×5): qty 1

## 2012-11-01 MED ORDER — TIOTROPIUM BROMIDE MONOHYDRATE 18 MCG IN CAPS
18.0000 ug | ORAL_CAPSULE | Freq: Every day | RESPIRATORY_TRACT | Status: DC
  Administered 2012-11-02 – 2012-11-05 (×4): 18 ug via RESPIRATORY_TRACT
  Filled 2012-11-01: qty 5

## 2012-11-01 MED ORDER — PREDNISONE 20 MG PO TABS
60.0000 mg | ORAL_TABLET | Freq: Once | ORAL | Status: AC
  Administered 2012-11-01: 60 mg via ORAL
  Filled 2012-11-01: qty 3

## 2012-11-01 MED ORDER — IPRATROPIUM BROMIDE 0.02 % IN SOLN
1.5000 mg | Freq: Once | RESPIRATORY_TRACT | Status: AC
  Administered 2012-11-01: 1.5 mg via RESPIRATORY_TRACT
  Filled 2012-11-01: qty 7.5

## 2012-11-01 MED ORDER — SODIUM CHLORIDE 0.9 % IJ SOLN
3.0000 mL | Freq: Two times a day (BID) | INTRAMUSCULAR | Status: DC
  Administered 2012-11-02 – 2012-11-04 (×6): 3 mL via INTRAVENOUS

## 2012-11-01 MED ORDER — ARFORMOTEROL TARTRATE 15 MCG/2ML IN NEBU
15.0000 ug | INHALATION_SOLUTION | Freq: Two times a day (BID) | RESPIRATORY_TRACT | Status: DC
  Administered 2012-11-02 – 2012-11-05 (×7): 15 ug via RESPIRATORY_TRACT
  Filled 2012-11-01 (×11): qty 2

## 2012-11-01 MED ORDER — ONDANSETRON HCL 4 MG/2ML IJ SOLN: 4.0000 mg | Freq: Four times a day (QID) | INTRAMUSCULAR | Status: DC | PRN

## 2012-11-01 MED ORDER — NEBIVOLOL HCL 5 MG PO TABS
5.0000 mg | ORAL_TABLET | Freq: Every day | ORAL | Status: DC
  Administered 2012-11-01 – 2012-11-05 (×5): 5 mg via ORAL
  Filled 2012-11-01 (×5): qty 1

## 2012-11-01 MED ORDER — SODIUM CHLORIDE 0.9 % IJ SOLN: 3.0000 mL | INTRAMUSCULAR | Status: DC | PRN

## 2012-11-01 MED ORDER — NITROGLYCERIN 0.4 MG SL SUBL: 0.4000 mg | SUBLINGUAL_TABLET | SUBLINGUAL | Status: DC | PRN

## 2012-11-01 MED ORDER — ASPIRIN 81 MG PO CHEW
81.0000 mg | CHEWABLE_TABLET | Freq: Every day | ORAL | Status: DC
  Administered 2012-11-01 – 2012-11-05 (×5): 81 mg via ORAL
  Filled 2012-11-01 (×7): qty 1

## 2012-11-01 MED ORDER — DEXTROSE 5 % IV SOLN
1.0000 g | Freq: Every day | INTRAVENOUS | Status: DC
  Administered 2012-11-01 – 2012-11-03 (×3): 1 g via INTRAVENOUS
  Filled 2012-11-01 (×3): qty 10

## 2012-11-01 MED ORDER — FUROSEMIDE 10 MG/ML IJ SOLN
40.0000 mg | Freq: Once | INTRAMUSCULAR | Status: AC
  Administered 2012-11-01: 40 mg via INTRAVENOUS
  Filled 2012-11-01: qty 4

## 2012-11-01 MED ORDER — AZITHROMYCIN 500 MG PO TABS
500.0000 mg | ORAL_TABLET | Freq: Every day | ORAL | Status: DC
  Administered 2012-11-01 – 2012-11-03 (×3): 500 mg via ORAL
  Filled 2012-11-01 (×3): qty 1

## 2012-11-01 MED ORDER — PREDNISONE 20 MG PO TABS
40.0000 mg | ORAL_TABLET | Freq: Every day | ORAL | Status: DC
Start: 2012-11-02 — End: 2012-11-05
  Administered 2012-11-02 – 2012-11-05 (×4): 40 mg via ORAL
  Filled 2012-11-01 (×5): qty 2

## 2012-11-01 MED ORDER — ONDANSETRON HCL 4 MG PO TABS: 4.0000 mg | ORAL_TABLET | Freq: Four times a day (QID) | ORAL | Status: DC | PRN

## 2012-11-01 MED ORDER — ENOXAPARIN SODIUM 30 MG/0.3ML ~~LOC~~ SOLN
30.0000 mg | SUBCUTANEOUS | Status: DC
  Administered 2012-11-01 – 2012-11-04 (×4): 30 mg via SUBCUTANEOUS
  Filled 2012-11-01 (×5): qty 0.3

## 2012-11-01 MED ORDER — GUAIFENESIN ER 600 MG PO TB12
600.0000 mg | ORAL_TABLET | Freq: Two times a day (BID) | ORAL | Status: DC
  Administered 2012-11-01 – 2012-11-05 (×8): 600 mg via ORAL
  Filled 2012-11-01 (×9): qty 1

## 2012-11-01 MED ORDER — CLOPIDOGREL BISULFATE 75 MG PO TABS
75.0000 mg | ORAL_TABLET | Freq: Every day | ORAL | Status: DC
  Administered 2012-11-02 – 2012-11-05 (×4): 75 mg via ORAL
  Filled 2012-11-01 (×4): qty 1

## 2012-11-01 MED ORDER — TRAMADOL HCL 50 MG PO TABS: 50.0000 mg | ORAL_TABLET | Freq: Four times a day (QID) | ORAL | Status: DC | PRN

## 2012-11-01 MED ORDER — ATORVASTATIN CALCIUM 80 MG PO TABS
80.0000 mg | ORAL_TABLET | Freq: Every day | ORAL | Status: DC
  Administered 2012-11-01 – 2012-11-04 (×4): 80 mg via ORAL
  Filled 2012-11-01 (×5): qty 1

## 2012-11-01 MED ORDER — FAMOTIDINE 20 MG PO TABS
20.0000 mg | ORAL_TABLET | ORAL | Status: DC | PRN
  Administered 2012-11-03: 20 mg via ORAL
  Filled 2012-11-01 (×2): qty 1

## 2012-11-01 MED ORDER — FUROSEMIDE 10 MG/ML IJ SOLN
40.0000 mg | Freq: Every day | INTRAMUSCULAR | Status: DC
  Filled 2012-11-01: qty 4

## 2012-11-01 MED ORDER — VITAMIN B-12 1000 MCG PO TABS
1000.0000 ug | ORAL_TABLET | Freq: Every day | ORAL | Status: DC
  Administered 2012-11-01 – 2012-11-05 (×5): 1000 ug via ORAL
  Filled 2012-11-01 (×5): qty 1

## 2012-11-01 MED ORDER — SODIUM CHLORIDE 0.9 % IV SOLN: 250.0000 mL | INTRAVENOUS | Status: DC | PRN

## 2012-11-01 MED ORDER — DILTIAZEM HCL ER 240 MG PO CP24
240.0000 mg | ORAL_CAPSULE | Freq: Every day | ORAL | Status: DC
  Administered 2012-11-02 – 2012-11-05 (×4): 240 mg via ORAL
  Filled 2012-11-01 (×5): qty 1

## 2012-11-01 MED ORDER — LEVALBUTEROL HCL 0.63 MG/3ML IN NEBU
0.6300 mg | INHALATION_SOLUTION | RESPIRATORY_TRACT | Status: DC | PRN
  Administered 2012-11-01: 0.63 mg via RESPIRATORY_TRACT
  Filled 2012-11-01: qty 3

## 2012-11-01 MED ORDER — SODIUM CHLORIDE 0.9 % IJ SOLN
3.0000 mL | Freq: Two times a day (BID) | INTRAMUSCULAR | Status: DC
  Administered 2012-11-01 – 2012-11-04 (×4): 3 mL via INTRAVENOUS

## 2012-11-01 MED ORDER — VENLAFAXINE HCL ER 75 MG PO CP24
75.0000 mg | ORAL_CAPSULE | Freq: Two times a day (BID) | ORAL | Status: DC
  Administered 2012-11-01 – 2012-11-05 (×8): 75 mg via ORAL
  Filled 2012-11-01 (×9): qty 1

## 2012-11-01 MED ORDER — ACETAMINOPHEN 650 MG RE SUPP: 650.0000 mg | Freq: Four times a day (QID) | RECTAL | Status: DC | PRN

## 2012-11-01 MED ORDER — POLYETHYLENE GLYCOL 3350 17 G PO PACK
17.0000 g | PACK | Freq: Every day | ORAL | Status: DC | PRN
  Administered 2012-11-01 – 2012-11-02 (×2): 17 g via ORAL
  Filled 2012-11-01 (×3): qty 1

## 2012-11-01 NOTE — ED Notes (Signed)
Pt reports hx COPD on 3L [place] at home. Was diagnosed with UTI last week and started on cipro which she had an allergic reaction to and has felt more SOB since. Stopped taking cipro per PCP orders. States tightness in chest intermittent also.

## 2012-11-01 NOTE — ED Provider Notes (Signed)
I saw and evaluated the patient, reviewed the resident's note and I agree with the findings and plan.  At baseline the patient is on home oxygen and shortness of breath with minimal movement even at baseline with a worse cough and shortness for the last couple of days despite using her inhaler multiple times on examination she has decreased breath sounds bilaterally with slight diffuse wheezes but is able to speak full sentences with mild distress at rest on a continuous albuterol nebulizer and wants to go home if possible.  Hurman Horn, MD 11/01/12 2242

## 2012-11-01 NOTE — ED Notes (Signed)
Patient transported to X-ray 

## 2012-11-01 NOTE — ED Provider Notes (Signed)
History     CSN: [id number]  Arrival date & time 11/01/12  1001   First MD Initiated Contact with Patient 11/01/12 1114      Chief Complaint  Patient presents with  . Shortness of Breath    (Consider location/radiation/quality/duration/timing/severity/associated sxs/prior treatment) HPI Comments: 72 y.o. side effects w/ pmh of CAD, abdominal aortic ulceration / atherosclerosis, severe COPD with O2 requirement and continues to use cigarettes with oxygen, who presents to the Er w/ the cc of SOB. Pt states over the past few days her neb tx have not been helping, and she thinks her "copd is acting up". No fevers, but has had chills.   Patient is a 72 y.o. female presenting with shortness of breath. The history is provided by the patient.  Shortness of Breath Severity:  Moderate Onset quality:  Gradual Timing:  Constant Progression:  Worsening Chronicity:  Chronic Ineffective treatments:  Inhaler Associated symptoms: wheezing   Associated symptoms: no abdominal pain, no chest pain, no cough, no fever, no headaches, no neck pain, no rash and no vomiting     Past Medical History  Diagnosis Date  . CAD (coronary artery disease)   . Atherosclerosis of abdominal aorta   . On home oxygen therapy   . Tobacco abuse   . HTN (hypertension)   . COPD (chronic obstructive pulmonary disease)   . PVD (peripheral vascular disease)   . Anxiety   . GERD (gastroesophageal reflux disease)   . RBBB (right bundle branch block)   . Tachycardia   . High cholesterol   . Renal insufficiency   . Leukocytosis   . Endometrial carcinoma   . CHF (congestive heart failure)     Past Surgical History  Procedure Laterality Date  . Coronary angioplasty    . Abdominal hysterectomy    . Carotid stent    . Tonsillectomy    . Arm surgery      PLATES AND SCREWS  . Breast reduction surgery    . Femoral artery stent      Family History  Problem Relation Age of Onset  . Heart attack Father   .  Coronary artery disease Father   . Leukemia Mother   . Leukemia Brother     History  Substance Use Topics  . Smoking status: Current Every Day Smoker -- 1.00 packs/day for 53 years    Types: Cigarettes  . Smokeless tobacco: Not on file     Comment: currently smoking 3/4 ppd.   . Alcohol Use: Yes     Comment: occassional    OB History   Grav Para Term Preterm Abortions TAB SAB Ect Mult Living                  Review of Systems  Constitutional: Negative for fever, chills and fatigue.  HENT: Negative for facial swelling, drooling, neck pain and dental problem.   Eyes: Negative for pain, discharge and itching.  Respiratory: Positive for shortness of breath and wheezing. Negative for cough, choking and stridor.   Cardiovascular: Negative for chest pain.  Gastrointestinal: Negative for vomiting, abdominal pain and diarrhea.  Endocrine: Negative for cold intolerance and heat intolerance.  Genitourinary: Negative for vaginal discharge, difficulty urinating and vaginal pain.  Skin: Negative for pallor and rash.  Neurological: Negative for dizziness, light-headedness and headaches.  Psychiatric/Behavioral: Negative for behavioral problems and agitation.    Allergies  Ciprofloxacin; Uloric; Vicodin; Zolpidem tartrate; and Tetracycline  Home Medications   Current Outpatient Rx  Name  Route  Sig  Dispense  Refill  . albuterol (PROVENTIL HFA;VENTOLIN HFA) 108 (90 BASE) MCG/ACT inhaler   Inhalation   Inhale 2 puffs into the lungs every 4 (four) hours as needed.   1 Inhaler   3     Please make sure there is an actuation counter on  ...   . allopurinol (ZYLOPRIM) 100 MG tablet   Oral   Take 100 mg by mouth 2 (two) times daily.           Marland Kitchen arformoterol (BROVANA) 15 MCG/2ML NEBU   Nebulization   Take 2 mLs (15 mcg total) by nebulization 2 (two) times daily. DX  496   360 mL   3   . aspirin 81 MG tablet   Oral   Take 81 mg by mouth daily.           . budesonide  (PULMICORT) 0.5 MG/2ML nebulizer solution   Nebulization   Take 2 mLs (0.5 mg total) by nebulization 2 (two) times daily. DX  496   360 mL   1   . Casanthranol-Docusate Sodium 30-100 MG CAPS   Oral   Take by mouth daily.         . clopidogrel (PLAVIX) 75 MG tablet   Oral   Take 1 tablet (75 mg total) by mouth daily.   90 tablet   3   . diltiazem (DILACOR XR) 240 MG 24 hr capsule   Oral   Take 1 capsule (240 mg total) by mouth daily.   90 capsule   3   . famotidine (PEPCID) 20 MG tablet   Oral   Take 20 mg by mouth as needed for heartburn.         . furosemide (LASIX) 40 MG tablet   Oral   Take 40 mg by mouth daily.          . IRON PO   Oral   Take 130 mg by mouth 2 (two) times daily.          . nebivolol (BYSTOLIC) 5 MG tablet   Oral   Take 1 tablet (5 mg total) by mouth daily.   30 tablet   6   . nitroGLYCERIN (NITROSTAT) 0.4 MG SL tablet   Sublingual   Place 1 tablet (0.4 mg total) under the tongue every 5 (five) minutes as needed.   25 tablet   3   . Polyethylene Glycol 3350 (MIRALAX PO)   Oral   Take by mouth as needed.         . rosuvastatin (CRESTOR) 10 MG tablet   Oral   Take 20 mg by mouth daily.          Marland Kitchen tiotropium (SPIRIVA HANDIHALER) 18 MCG inhalation capsule   Inhalation   Place 1 capsule (18 mcg total) into inhaler and inhale daily.   90 capsule   3   . venlafaxine (EFFEXOR) 75 MG tablet   Oral   Take 75 mg by mouth daily.          . vitamin B-12 (CYANOCOBALAMIN) 1000 MCG tablet   Oral   Take 1,000 mcg by mouth daily.           BP 161/59  Pulse 66  Temp(Src) 98.4 F (36.9 C) (Oral)  Resp 18  SpO2 93%  Physical Exam  Constitutional: She is oriented to person, place, and time. No distress.  Frail appearing   HENT:  Head: Normocephalic and atraumatic.  Eyes: Pupils  are equal, round, and reactive to light. Right eye exhibits no discharge. Left eye exhibits no discharge.  Neck: Neck supple. No tracheal  deviation present.  Cardiovascular: Normal rate.  Exam reveals no gallop and no friction rub.   Pulmonary/Chest: No stridor.  Pt is moving poor air, RR is increased, prolonged expiratory phase   Abdominal: Soft. She exhibits no distension. There is no tenderness. There is no rebound.  Musculoskeletal: She exhibits no edema and no tenderness.  Neurological: She is alert and oriented to person, place, and time.  Skin: Skin is warm.    ED Course  Procedures (including critical care time)  Labs Reviewed  PRO B NATRIURETIC PEPTIDE  CBC  BASIC METABOLIC PANEL   Dg Chest 2 View  11/01/2012   *RADIOLOGY REPORT*  Clinical Data: Shortness of breath.  CHEST - 2 VIEW  Comparison: Chest x-ray 01/01/2012.  Findings: As with prior examinations there are areas of ill-defined interstitial prominence throughout the lung bases bilaterally, which have increased.  Linear opacities in the lung bases bilaterally also noted, also slightly increased, particularly on the left, which may represent worsening areas of atelectasis and/or chronic scarring.  Underlying air space consolidation in the left lower lobe is difficult to entirely exclude, but is not strongly favored.  No definite evidence of pulmonary edema.  Heart size is normal.  Upper mediastinal contours are within normal limits. Atherosclerosis in the thoracic aorta.  IMPRESSION: 1.  Diffuse ill-defined interstitial prominence throughout the mid and lower lungs bilaterally, similar but increased compared to prior examinations, concerning for a developing interstitial lung disease.  This could be better evaluated with a non emergent high- resolution chest CT. 2.  Increasing linear bibasilar opacities (left greater than right), favored to reflect worsening bibasilar scarring and/or areas of subsegmental atelectasis. 3.  Atherosclerosis.   Original Report Authenticated By: Trudie Reed, M.D.     MDM  Xray does not show signs of pneumonia and pt is not febrile  -- doubt penumonia. She is moving poor air on exam, mild respir distress without improvement w/ neb txs at home, will give one hour continuous, and give steroids.   After one hour continuous pt is doing markedly better. Her renal function has worsened, and in setting of copd exacerbation, and possible volume overload, pt is admitted for further eval and care.   Diagnosis:  1. COPD exacerbation 2. Mild respiratory distress  3. Acute renal failure        Bernadene Person, MD 11/01/12 1444

## 2012-11-01 NOTE — Progress Notes (Signed)
CAT started at this time per MD order. BBS diminished, SAT 93% on 3 LNC, wears 3L at home. RT will continue to monitor.

## 2012-11-01 NOTE — H&P (Addendum)
[facility] History and Physical  Wanda Yang ZOX:[id number] DOB: [birth year] DOA: 11/01/2012  Referring physician:Dr Fonnie Jarvis  PCP: Henri Medal, MD  Specialists:   Chief Complaint: worsening SOB  HPI: Wanda Yang is a 72 y.o. female with h/o multiple medical problems as listed below including COPD home o2 dependent on 3l, with continued tobacco abuse, dastolic CHF(last EF 55-60%), CAD, who presents with worsening sob x1week. She also admits to cough occasionally productive of brown sputum,and subjective fevers. She also has had some chronic peripheral edema but states it has not worsened, admits to chest tightness, denies PND. She reports that she was recently started outpt on prednisone and she was diagnosed with a UTI as well>> initially was started on cipro but she had an adverse reaction to it and so it was changed to bactrim. Per her report her breathing continued to worsen even with the prednisone so she came to the ED.  CXR in ED increased interstitial prominences and increasing bibasilar opacities, WBC 19.9, elevated BNP, troponin neg x1, Cr 3.6- from 1.9 a month ago. She was given IV lasix, abx, prednisone and admission to hospitalist service requested.   Review of Systems: The patient denies anorexia, weight loss,, vision loss, decreased hearing, hoarseness, syncope, balance deficits, hemoptysis, abdominal pain, melena, hematochezia, severe indigestion/heartburn, hematuria, incontinence, transient blindness, difficulty walking, depression, unusual weight change, abnormal bleeding.  Past Medical History  Diagnosis Date  . CAD (coronary artery disease)   . Atherosclerosis of abdominal aorta   . On home oxygen therapy   . Tobacco abuse   . HTN (hypertension)   . COPD (chronic obstructive pulmonary disease)   . PVD (peripheral vascular disease)   . Anxiety   . GERD (gastroesophageal reflux disease)   . RBBB (right bundle branch block)   . Tachycardia   . High  cholesterol   . Renal insufficiency   . Leukocytosis   . Endometrial carcinoma   . CHF (congestive heart failure)    Past Surgical History  Procedure Laterality Date  . Coronary angioplasty    . Abdominal hysterectomy    . Carotid stent    . Tonsillectomy    . Arm surgery      PLATES AND SCREWS  . Breast reduction surgery    . Femoral artery stent     Social History:  reports that she has been smoking Cigarettes.  She has a 53 pack-year smoking history. She does not have any smokeless tobacco history on file. She reports that  drinks alcohol. She reports that she does not use illicit drugs.  where does patient live--home   Allergies  Allergen Reactions  . Ciprofloxacin   . Uloric (Febuxostat)   . Vicodin (Hydrocodone-Acetaminophen)   . Zolpidem Tartrate   . Tetracycline Rash    Family History  Problem Relation Age of Onset  . Heart attack Father   . Coronary artery disease Father   . Leukemia Mother   . Leukemia Brother     Prior to Admission medications   Medication Sig Start Date End Date Taking? Authorizing Provider  albuterol (PROVENTIL HFA;VENTOLIN HFA) 108 (90 BASE) MCG/ACT inhaler Inhale 2 puffs into the lungs every 4 (four) hours as needed. 08/01/12  Yes Barbaraann Share, MD  allopurinol (ZYLOPRIM) 100 MG tablet Take 100 mg by mouth 2 (two) times daily.     Yes Historical Provider, MD  arformoterol (BROVANA) 15 MCG/2ML NEBU Take 2 mLs (15 mcg total) by nebulization 2 (two) times daily. DX  496 10/01/12  Yes Barbaraann Share, MD  aspirin 81 MG tablet Take 81 mg by mouth daily.     Yes Historical Provider, MD  budesonide (PULMICORT) 0.5 MG/2ML nebulizer solution Take 2 mLs (0.5 mg total) by nebulization 2 (two) times daily. DX  496 04/27/12  Yes Barbaraann Share, MD  Casanthranol-Docusate Sodium 30-100 MG CAPS Take 1 capsule by mouth daily as needed. For constipation   Yes Historical Provider, MD  clopidogrel (PLAVIX) 75 MG tablet Take 1 tablet (75 mg total) by mouth  daily. 08/06/12  Yes [name], MD  diltiazem (DILACOR XR) 240 MG 24 hr capsule Take 1 capsule (240 mg total) by mouth daily. 09/25/12  Yes [name], MD  famotidine (PEPCID) 20 MG tablet Take 20 mg by mouth as needed for heartburn.   Yes Historical Provider, MD  furosemide (LASIX) 40 MG tablet Take 40 mg by mouth daily as needed. For weight gain 12/15/11  Yes Historical Provider, MD  IRON PO Take 90 mg by mouth daily.    Yes Historical Provider, MD  nebivolol (BYSTOLIC) 10 MG tablet Take 5 mg by mouth daily.   Yes Historical Provider, MD  nitroGLYCERIN (NITROSTAT) 0.4 MG SL tablet Place 1 tablet (0.4 mg total) under the tongue every 5 (five) minutes as needed. 10/10/12  Yes [name], MD  polyethylene glycol [facility] Ethelene Hal) packet Take 17 g by mouth daily as needed. For constipation   Yes Historical Provider, MD  predniSONE (DELTASONE) 20 MG tablet Take 20 mg by mouth 2 (two) times daily.   Yes Historical Provider, MD  rosuvastatin (CRESTOR) 40 MG tablet Take 40 mg by mouth daily.   Yes Historical Provider, MD  sulfamethoxazole-trimethoprim (BACTRIM DS,SEPTRA DS) 800-160 MG per tablet Take 1 tablet by mouth 2 (two) times daily.   Yes Historical Provider, MD  tiotropium (SPIRIVA HANDIHALER) 18 MCG inhalation capsule Place 1 capsule (18 mcg total) into inhaler and inhale daily. 08/01/12  Yes Barbaraann Share, MD  traMADol (ULTRAM) 50 MG tablet Take 50 mg by mouth every 6 (six) hours as needed for pain.   Yes Historical Provider, MD  venlafaxine XR (EFFEXOR-XR) 75 MG 24 hr capsule Take 75 mg by mouth 2 (two) times daily.   Yes Historical Provider, MD  vitamin B-12 (CYANOCOBALAMIN) 1000 MCG tablet Take 1,000 mcg by mouth daily.   Yes Historical Provider, MD   Physical Exam: Filed Vitals:   11/01/12 1155 11/01/12 1245 11/01/12 1345 11/01/12 1445  BP:  152/68 149/43 147/67  Pulse:  66 77 81  Temp:    97.6 F (36.4 C)  TempSrc:      Resp:  22 20 17   SpO2: 97%   91%     Constitutional: Vital signs reviewed.  Patient is a well-developed and well-nourished in no acute distress and cooperative with exam. Alert and oriented x3.  Head: Normocephalic and atraumatic Nose: No erythema or drainage noted.  Turbinates normal Mouth: no erythema or exudates, MMM Eyes: PERRL, EOMI, conjunctivae normal, No scleral icterus.  Neck: Supple, Trachea midline normal ROM, No JVD, mass, thyromegaly, or carotid bruit present.  Cardiovascular: RRR, S1 normal, S2 normal, pulses symmetric and intact bilaterally Pulmonary/Chest: normal respiratory effort,decreased air entry bil, occasional wheeze, basilar crackles. Abdominal: Soft. Non-tender, non-distended, bowel sounds are normal, no masses, organomegaly, or guarding present.  Extremities:trace edema, no cyanosis Neurological: A&O x3, Strength is normal and symmetric bilaterally, cranial nerve II-XII are grossly intact, no focal motor deficit, sensory  intact to light touch bilaterally.  Skin: Warm, dry and intact. No rash.  Psychiatric: Normal mood and affect. speech and behavior is normal. Judgment and thought content normal.  Labs on Admission:  Basic Metabolic Panel:  Recent Labs Lab 11/01/12 1149  NA 133*  K 3.7  CL 93*  CO2 25  GLUCOSE 111*  BUN 61*  CREATININE 3.60*  CALCIUM 9.3   Liver Function Tests: No results found for this basename: AST, ALT, ALKPHOS, BILITOT, PROT, ALBUMIN,  in the last 168 hours No results found for this basename: LIPASE, AMYLASE,  in the last 168 hours No results found for this basename: AMMONIA,  in the last 168 hours CBC:  Recent Labs Lab 11/01/12 1149  WBC 19.9*  HGB 9.5*  HCT 28.9*  MCV 85.5  PLT 268   Cardiac Enzymes: No results found for this basename: CKTOTAL, CKMB, CKMBINDEX, TROPONINI,  in the last 168 hours  BNP (last 3 results)  Recent Labs  01/01/12 1515 01/02/12 0821 11/01/12 1149  PROBNP 1194.0* 885.3* 7067.0*   CBG: No results found for this basename:  GLUCAP,  in the last 168 hours  Radiological Exams on Admission: Dg Chest 2 View  11/01/2012   *RADIOLOGY REPORT*  Clinical Data: Shortness of breath.  CHEST - 2 VIEW  Comparison: Chest x-ray 01/01/2012.  Findings: As with prior examinations there are areas of ill-defined interstitial prominence throughout the lung bases bilaterally, which have increased.  Linear opacities in the lung bases bilaterally also noted, also slightly increased, particularly on the left, which may represent worsening areas of atelectasis and/or chronic scarring.  Underlying air space consolidation in the left lower lobe is difficult to entirely exclude, but is not strongly favored.  No definite evidence of pulmonary edema.  Heart size is normal.  Upper mediastinal contours are within normal limits. Atherosclerosis in the thoracic aorta.  IMPRESSION: 1.  Diffuse ill-defined interstitial prominence throughout the mid and lower lungs bilaterally, similar but increased compared to prior examinations, concerning for a developing interstitial lung disease.  This could be better evaluated with a non emergent high- resolution chest CT. 2.  Increasing linear bibasilar opacities (left greater than right), favored to reflect worsening bibasilar scarring and/or areas of subsegmental atelectasis. 3.  Atherosclerosis.   Original Report Authenticated By: Trudie Reed, M.D.      Assessment/Plan Active Problems:  Acute-on-chronic respiratory failure, likely secondary to CHF, Probable PNA  and COPD exacerbation -As discussed above in pt presenting with worsening SOB, BNP elevated and CXR increased interstitial prominences and increasing bibasilar opacities. Also with cough and leukocytosis. -will diurese with IV lasix, with close monitoring of renal function  -continue empiric abx with Rocephin and zithromax started in ED for probable CAP -follow and repeat CXR in and further manage as clinically appropriate -place on nebulized  bronchodilators, mucolytics, continue prednisone and supplemental O2. Also possibility of developing interstitial lung disease noted, follow and consider pulmonology consultation pending clincal course. -will also cycle cardiac enzymes and follow  Acute on chronic renal failure - Her last Cr 09/2012 was1.9, and 3.6 today -likely multifactorial - was recent UTI diagnosis, initially started on cipro>>adverse reaction>> changed to bactrim; also poor renal perfusion due to CHF possible factor as well -d/c bactrim, treat with rocephin for now, obtain renal US follow -close monitoring of cr with diuresis, if further worsening would recommend renal consult for further recs Recent UTI -will repeat UA and culture, and as above Rocephin pending results  HYPERTENSION -continue outpt meds  GERD (gastroesophageal reflux disease) -continue pepcid  Leukocytosis, unspecified -likely secondary to infection and recent UTI, but was also recently started on prednisone which could be contributing  Hyponatremia -mild, secondary to CHF/hypervolemia vs SIADH secondary to lung process H/o CAD -continue outpt medications, cycle CEs as above, she is CP free at this time.   Code Status: DNR Family Communication: husband at bedside Disposition Plan: admit to tele  Time spent: >44mins  Kela Millin [facility] Pager [phone]  If 7PM-7AM, please contact night-coverage [email or web address] Password [facility] 11/01/2012, 4:03 PM

## 2012-11-01 NOTE — ED Notes (Signed)
RT at bedside to begin breathing treatment.

## 2012-11-02 ENCOUNTER — Inpatient Hospital Stay (HOSPITAL_COMMUNITY): Payer: Federal, State, Local not specified - PPO

## 2012-11-02 DIAGNOSIS — J189 Pneumonia, unspecified organism: Secondary | ICD-10-CM | POA: Diagnosis present

## 2012-11-02 LAB — BASIC METABOLIC PANEL
CO2: 26 mEq/L (ref 19–32)
Calcium: 8.8 mg/dL (ref 8.4–10.5)
GFR calc Af Amer: 12 mL/min — ABNORMAL LOW (ref >90)
Sodium: 131 mEq/L — ABNORMAL LOW (ref 135–145)

## 2012-11-02 LAB — CBC
MCV: 85 fL (ref 78.0–100.0)
Platelets: 238 10*3/uL (ref 150–400)
RBC: 2.93 MIL/uL — ABNORMAL LOW (ref 3.87–5.11)
WBC: 13.6 10*3/uL — ABNORMAL HIGH (ref 4.0–10.5)

## 2012-11-02 NOTE — Progress Notes (Signed)
Utilization Review Completed.Wanda Yang T5/30/2014  

## 2012-11-02 NOTE — Progress Notes (Signed)
PROGRESS NOTE  Wanda STOERMER WUJ:[id number] DOB: 1941-05-04 DOA: 11/01/2012 PCP: Warrick Parisian, MD  Brief narrative: 72 yr old female admitted 11/01/28 with worsening SOB after being treated for a presumed UTI for 2 days and then being switched to Bactrim recently  Past medical history-As per Problem list Chart reviewed as below-  Admission 01/01/12 for COPD exacerbation, anemia-Noted creat at time was 2.0  Admission 04/17/08 with CAD s/p PTCA Mid-Right CA and 1st obtuse  S/p iliac stents 2004  PAD  Noted Abd aortic aneurysm + ulcerated plaque on cath 07/15/2008-was supposed to have seen Dr. Arbie Cookey   Consultants:  Telephone consulted Dr. Caryn Section of nephrology  Procedures:   portable chest x-ray 5/29 = ill-defined interstitial prominence right mid and lower lung similar but increased compared to prior? Interstitial lung disease -increasingly near bit bibasilar opacities left great than right.  Chest x-ray two-view 5/30 = no marked change in left greater than right airspace disease worrisome for pneumonia superimposed on COPD  Renal ultrasound 5/29 = no evidence of hydronephrosis, bilateral renal cortical thickening, bilateral nonobstructing nephrolithiasis, bilateral simple appearing cysts  Antibiotics:  Ceftriaxone 5/30  Azithromycin 5/30   Subjective  Doing fair-feels a little bit better in terms of shortness of breath. No nausea vomiting currently. Denies chest pain. Denies blurred double vision.  Moderately tolerating diet. Tells me she had some bleeding in her urine 2 days prior to getting her ciprofloxacin which was not a large amount-she had a reaction to ciprofloxacin and this is why was changed to Bactrim Uses oxygen on 3 L as well as CPAP Comply with her medications   Objective    Interim History: nad  Telemetry: Sinus rhythm without any issues  Objective: Filed Vitals:   11/01/12 2129 11/02/12 0456 11/02/12 0941 11/02/12 0946  BP: 152/76 153/69  143/61   Pulse: 79 73    Temp: 98.2 F (36.8 C) 98.8 F (37.1 C)    TempSrc: Oral Oral    Resp: 18 18    SpO2: 92% 95% 95%     Intake/Output Summary (Last 24 hours) at 11/02/12 1013 Last data filed at 11/02/12 0955  Gross per 24 hour  Intake    779 ml  Output    850 ml  Net    -71 ml    Exam:  General: Alert pleasant ill-appearing Caucasian the female looking older than stated age Cardiovascular: S1-S2 no murmur rub or gallop Respiratory: Clinically clear Abdomen: Soft nontender nondistended Skin no lower extremity edema Neuro grossly intact  Data Reviewed: Basic Metabolic Panel:  Recent Labs Lab 11/01/12 1149 11/01/12 1621 11/02/12 0440  NA 133*  --  131*  K 3.7  --  4.0  CL 93*  --  91*  CO2 25  --  26  GLUCOSE 111*  --  120*  BUN 61*  --  65*  CREATININE 3.60* 3.80* 3.98*  CALCIUM 9.3  --  8.8   Liver Function Tests: No results found for this basename: AST, ALT, ALKPHOS, BILITOT, PROT, ALBUMIN,  in the last 168 hours No results found for this basename: LIPASE, AMYLASE,  in the last 168 hours No results found for this basename: AMMONIA,  in the last 168 hours CBC:  Recent Labs Lab 11/01/12 1149 11/01/12 1621 11/02/12 0440  WBC 19.9* 13.7* 13.6*  HGB 9.5* 8.6* 8.4*  HCT 28.9* 26.2* 24.9*  MCV 85.5 85.9 85.0  PLT 268 244 238   Cardiac Enzymes:  Recent Labs Lab 11/01/12 1621 11/01/12 2223 11/02/12  0440  TROPONINI <0.30 <0.30 <0.30   BNP: No components found with this basename: POCBNP,  CBG: No results found for this basename: GLUCAP,  in the last 168 hours  No results found for this or any previous visit (from the past 240 hour(s)).   Studies:              All Imaging reviewed and is as per above notation   Scheduled Meds: . allopurinol  100 mg Oral Daily  . arformoterol  15 mcg Nebulization BID  . aspirin  81 mg Oral Daily  . atorvastatin  80 mg Oral q1800  . azithromycin  500 mg Oral Daily  . budesonide  0.5 mg Nebulization BID  .  cefTRIAXone (ROCEPHIN) IVPB 1 gram/50 mL D5W  1 g Intravenous Daily  . clopidogrel  75 mg Oral Daily  . diltiazem  240 mg Oral Daily  . enoxaparin (LOVENOX) injection  30 mg Subcutaneous Q24H  . furosemide  40 mg Intravenous Daily  . guaiFENesin  600 mg Oral BID  . nebivolol  5 mg Oral Daily  . predniSONE  40 mg Oral Q breakfast  . sodium chloride  3 mL Intravenous Q12H  . sodium chloride  3 mL Intravenous Q12H  . tiotropium  18 mcg Inhalation Daily  . venlafaxine XR  75 mg Oral BID  . vitamin B-12  1,000 mcg Oral Daily   Continuous Infusions:    Assessment/Plan: 1. 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 Will narrow to potentially doxycycline 1-2 days. 2. 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 Continue arformoterol 50 mcg twice a day, Pulmicort neb 0.5 twice a day, Robitussin 5 mouth every 4 when necessary cough, Spiriva 18 mcg daily, Xopenex 0.63 every 3 when necessary with a hope to wean back. Continue prednisone 40 mg daily with breakfast. 3. Hypertension continue diltiazem 240 mg daily, Bystolic 5 mg daily 4. History of CHF, compensated, although BNP elevated to 7067.  last echo 12/24/2011 EF 55-60%-hold further Lasix given AKI monitor daily weights 5. Acute kidney injury baseline creatinine 1.9 currently not 3.9. Multifactorial potentially related to Bactrim use, some amount of blood loss in urine with UTI?, Use of antihypertensives and volume depletion-ultrasound nonspecific of kidneys-discussed with Dr. Caryn Section who recommends to continue to monitor 6. hematuria-patient is a 50 year pack smoker-suspicious for potential bladder malignancy. Repeat UA as an outpatient 7. CAd s/p PTCA in 04/2008-cautiously continue aspirin 81 mg daily, Plavix 75 mg daily. 8. Anemia-borderline microcytic-probably related to decreased renal function. We'll get iron studies. Baseline seems to be and 8-10 range.  She had a virtual colonoscopy 02/13/2012 which showed a distal sigmoid rectosigmoid soft tissue density? Non-tach stool colonic polyp-was recommended to have a followup virtual colonoscopy. 9. Leukocytosis-multifactorial potentially related to pneumonia/steroid use for COPD exacerbation. Monitor  Code Status: FUll Family Communication:  Discussed in detial with husband at bedside Disposition Plan:  Inpatient 3-4 days   Pleas Koch, MD  [facility] Pager [phone] 11/02/2012, 10:13 AM    LOS: 1 day

## 2012-11-02 NOTE — Progress Notes (Addendum)
Nutrition Brief Note  Patient identified on the Malnutrition Screening Tool (MST) Report for recent weight loss without trying (patient unsure).  Per records below, patient's weight has been stable.  Wt Readings from Last 10 Encounters:  10/02/12 190 lb 12.8 oz (86.546 kg)  08/01/12 192 lb 3.2 oz (87.181 kg)  03/20/12 193 lb (87.544 kg)  01/27/12 194 lb 12.8 oz (88.361 kg)  01/06/12 188 lb 8 oz (85.503 kg)  09/20/11 197 lb 12.8 oz (89.721 kg)  07/29/11 200 lb 12.8 oz (91.082 kg)  06/10/11 196 lb 6.4 oz (89.086 kg)  04/06/11 196 lb 1.9 oz (88.959 kg)  02/03/11 198 lb 12.8 oz (90.175 kg)    BMI = 29.3 kg/m2.  Patient meets criteria for Overweight based on current BMI.   Current diet order is Heart Healthy, patient consuming approximately 90% of meals at this time.  Labs and medications reviewed.   No nutrition interventions warranted at this time. If nutrition issues arise, please consult RD.   Maureen Chatters, RD, LDN Pager #: [phone] After-Hours Pager #: [phone]

## 2012-11-02 NOTE — Progress Notes (Signed)
Patient refused CPAP, stating that she "didn't want to add any new things" to her current treatment.  Patient advised to call for RT should she change her mind.

## 2012-11-03 LAB — RENAL FUNCTION PANEL
BUN: 71 mg/dL — ABNORMAL HIGH (ref 6–23)
CO2: 25 mEq/L (ref 19–32)
Chloride: 94 mEq/L — ABNORMAL LOW (ref 96–112)
GFR calc Af Amer: 13 mL/min — ABNORMAL LOW (ref >90)
Glucose, Bld: 121 mg/dL — ABNORMAL HIGH (ref 70–99)
Potassium: 3.9 mEq/L (ref 3.5–5.1)

## 2012-11-03 LAB — CBC
HCT: 25.7 % — ABNORMAL LOW (ref 36.0–46.0)
Hemoglobin: 8.5 g/dL — ABNORMAL LOW (ref 12.0–15.0)
MCV: 85.1 fL (ref 78.0–100.0)
RBC: 3.02 MIL/uL — ABNORMAL LOW (ref 3.87–5.11)
WBC: 15.9 10*3/uL — ABNORMAL HIGH (ref 4.0–10.5)

## 2012-11-03 LAB — IRON AND TIBC: Saturation Ratios: 16 % — ABNORMAL LOW (ref 20–55)

## 2012-11-03 MED ORDER — DOXYCYCLINE HYCLATE 100 MG PO TABS: 100.0000 mg | ORAL_TABLET | Freq: Two times a day (BID) | ORAL | Status: DC

## 2012-11-03 MED ORDER — AZITHROMYCIN 500 MG PO TABS
500.0000 mg | ORAL_TABLET | Freq: Every day | ORAL | Status: DC
  Administered 2012-11-04 – 2012-11-05 (×2): 500 mg via ORAL
  Filled 2012-11-03 (×3): qty 1

## 2012-11-03 NOTE — Evaluation (Signed)
Physical Therapy Evaluation Patient Details Name: Wanda Yang MRN: [id number] DOB: Sep 06, 1940 Today's Date: 11/03/2012 Time: 1191-4782 PT Time Calculation (min): 19 min  PT Assessment / Plan / Recommendation Clinical Impression  Pt is 72 y/o female admitted for SOB with hx of COPD.  Pt limited due to overall impaired endurance.  Educated pt on energy conservation and use of DMEs, short frequent walks, planning days and use of RW on difficult days.  Pt will benefit from acute PT services to improve overall endurance and further education.    PT Assessment  Patient needs continued PT services    Follow Up Recommendations  Supervision - Intermittent    Barriers to Discharge None      Equipment Recommendations   (3n1)    Frequency Min 3X/week    Precautions / Restrictions Precautions Precautions: Fall (report near falls several times)   Pertinent Vitals/Pain SaO2 decreased to 84% on 3L with ambulation.  Pt able to return Sa02 to 93% after 5 minutes of pursed lip breathing.  Pt with home O2 3L      Mobility  Bed Mobility Bed Mobility: Supine to Sit Supine to Sit: 6: Modified independent (Device/Increase time);HOB elevated;With rails Transfers Transfers: Sit to Stand;Stand to Sit Sit to Stand: 5: Supervision;From bed Stand to Sit: 5: Supervision;To chair/3-in-1 Details for Transfer Assistance: supervision for safety with cues for hand placement when transfering to toliet seat Ambulation/Gait Ambulation/Gait Assistance: 4: Min guard Ambulation Distance (Feet): 20 Feet Assistive device: None Ambulation/Gait Assistance Details: minguard for safety due to impaired endurance and guarded gait Gait Pattern: Step-through pattern;Decreased stride length;Decreased weight shift to right;Shuffle;Wide base of support Gait velocity: decreased Stairs: No    Exercises     PT Diagnosis: Generalized weakness  PT Problem List: Decreased strength;Decreased activity tolerance;Decreased  balance;Decreased mobility;Decreased knowledge of use of DME;Cardiopulmonary status limiting activity PT Treatment Interventions: DME instruction;Gait training;Stair training;Functional mobility training;Therapeutic activities;Therapeutic exercise;Balance training;Patient/family education   PT Goals Acute Rehab PT Goals PT Goal Formulation: With patient Time For Goal Achievement: 11/10/12 Potential to Achieve Goals: Good Pt will go Sit to Stand: Independently PT Goal: Sit to Stand - Progress: Goal set today Pt will go Stand to Sit: Independently PT Goal: Stand to Sit - Progress: Goal set today Pt will Ambulate: >150 feet;Independently PT Goal: Ambulate - Progress: Goal set today Pt will Go Up / Down Stairs: 6-9 stairs;with modified independence;with least restrictive assistive device PT Goal: Up/Down Stairs - Progress: Goal set today  Visit Information  Last PT Received On: 11/03/12 Assistance Needed: +1    Subjective Data  Subjective: "I just get so short of breath easily." Patient Stated Goal: To go home and improve breathing   Prior Functioning  Home Living Lives With: Spouse Available Help at Discharge: Family Type of Home: House Home Access: Stairs to enter [employer] of Steps: 7 Entrance Stairs-Rails: Left Home Layout: Two level;Able to live on main level with bedroom/bathroom Bathroom Shower/Tub: Walk-in shower;Door (slight step over) [employer]: Standard Bathroom Accessibility: Yes How Accessible: Accessible via walker Home Adaptive Equipment: Built-in shower seat;Hand-held shower hose;Walker - rolling Prior Function Level of Independence: Independent Able to Take Stairs?: Yes Driving: No Vocation: Retired Musician: No difficulties Dominant Hand: Right    Cognition  Cognition Arousal/Alertness: Awake/alert Behavior During Therapy: WFL for tasks assessed/performed Overall Cognitive Status: Within Functional Limits for  tasks assessed    Extremity/Trunk Assessment Right Lower Extremity Assessment RLE ROM/Strength/Tone: Within functional levels Left Lower Extremity Assessment LLE ROM/Strength/Tone: Within  functional levels   Balance    End of Session PT - End of Session Equipment Utilized During Treatment: Gait belt;Oxygen (3L) Activity Tolerance: Patient limited by fatigue Patient left: in chair;with call bell/phone within reach Nurse Communication: Mobility status  GP     Geo Slone 11/03/2012, 9:05 AM Jake Shark, PT DPT [phone]

## 2012-11-03 NOTE — Progress Notes (Signed)
Resp Care Note;Pt refusing CPAP. 

## 2012-11-03 NOTE — Progress Notes (Signed)
PROGRESS NOTE  Wanda Yang MVH:[id number] DOB: 1941/05/19 DOA: 11/01/2012 PCP: Warrick Parisian, MD  Brief narrative: 72 yr old female admitted 11/01/28 with worsening SOB after being treated for a presumed UTI for 2 days and then being switched to Bactrim recently  Past medical history-As per Problem list Chart reviewed as below-  Admission 01/01/12 for COPD exacerbation, anemia-Noted creat at time was 2.0  Admission 04/17/08 with CAD s/p PTCA Mid-Right CA and 1st obtuse  S/p iliac stents 2004  PAD  Noted Abd aortic aneurysm + ulcerated plaque on cath 07/15/2008-was supposed to have seen Dr. Arbie Cookey   Consultants:  Telephone consulted Dr. Caryn Section of nephrology 5/30  Procedures:   portable chest x-ray 5/29 = ill-defined interstitial prominence right mid and lower lung similar but increased compared to prior? Interstitial lung disease -increasingly near bit bibasilar opacities left great than right.  Chest x-ray two-view 5/30 = no marked change in left greater than right airspace disease worrisome for pneumonia superimposed on COPD  Renal ultrasound 5/29 = no evidence of hydronephrosis, bilateral renal cortical thickening, bilateral nonobstructing nephrolithiasis, bilateral simple appearing cysts  Antibiotics:  Ceftriaxone 5/30  Azithromycin 5/30   Subjective  Doing fair-feels a little bit better today. Passing a lot of urine. No nausea vomiting. No fever no chill Not producing any sputum currently Has not passed stool as yet   Objective    Interim History: nad  Telemetry: Sinus rhythm without any issues  Objective: Filed Vitals:   11/02/12 2118 11/03/12 0500 11/03/12 1019 11/03/12 1224  BP: 147/73 153/66 143/62   Pulse: 71 69    Temp: 97.8 F (36.6 C) 97.6 F (36.4 C)    TempSrc:      Resp: 18 18    Weight:  85.458 kg (188 lb 6.4 oz)    SpO2: 91% 94%  98%    Intake/Output Summary (Last 24 hours) at 11/03/12 1242 Last data filed at 11/03/12 1029  Gross  per 24 hour  Intake    723 ml  Output   1400 ml  Net   -677 ml    Exam:  General: Alert pleasant ill-appearing Caucasian the female looking older than stated age Cardiovascular: S1-S2 no murmur rub or gallop Respiratory: Clinically clear Abdomen: Soft nontender nondistended Skin no lower extremity edema Neuro grossly intact  Data Reviewed: Basic Metabolic Panel:  Recent Labs Lab 11/01/12 1149 11/01/12 1621 11/02/12 0440 11/03/12 0445  NA 133*  --  131* 132*  K 3.7  --  4.0 3.9  CL 93*  --  91* 94*  CO2 25  --  26 25  GLUCOSE 111*  --  120* 121*  BUN 61*  --  65* 71*  CREATININE 3.60* 3.80* 3.98* 3.72*  CALCIUM 9.3  --  8.8 8.9  PHOS  --   --   --  4.1   Liver Function Tests:  Recent Labs Lab 11/03/12 0445  ALBUMIN 2.9*   No results found for this basename: LIPASE, AMYLASE,  in the last 168 hours No results found for this basename: AMMONIA,  in the last 168 hours CBC:  Recent Labs Lab 11/01/12 1149 11/01/12 1621 11/02/12 0440 11/03/12 0445  WBC 19.9* 13.7* 13.6* 15.9*  HGB 9.5* 8.6* 8.4* 8.5*  HCT 28.9* 26.2* 24.9* 25.7*  MCV 85.5 85.9 85.0 85.1  PLT 268 244 238 295   Cardiac Enzymes:  Recent Labs Lab 11/01/12 1621 11/01/12 2223 11/02/12 0440  TROPONINI <0.30 <0.30 <0.30   BNP: No components found with  this basename: POCBNP,  CBG: No results found for this basename: GLUCAP,  in the last 168 hours  Recent Results (from the past 240 hour(s))  URINE CULTURE     Status: None   Collection Time    11/01/12  7:40 PM      Result Value Range Status   Specimen Description URINE, RANDOM   Final   Special Requests NONE   Final   Culture  Setup Time 11/01/2012 20:38   Final   Colony Count PENDING   Incomplete   Culture Culture reincubated for better growth   Final   Report Status PENDING   Incomplete     Studies:              All Imaging reviewed and is as per above notation   Scheduled Meds: . allopurinol  100 mg Oral Daily  . arformoterol   15 mcg Nebulization BID  . aspirin  81 mg Oral Daily  . atorvastatin  80 mg Oral q1800  . azithromycin  500 mg Oral Daily  . budesonide  0.5 mg Nebulization BID  . cefTRIAXone (ROCEPHIN) IVPB 1 gram/50 mL D5W  1 g Intravenous Daily  . clopidogrel  75 mg Oral Daily  . diltiazem  240 mg Oral Daily  . enoxaparin (LOVENOX) injection  30 mg Subcutaneous Q24H  . guaiFENesin  600 mg Oral BID  . nebivolol  5 mg Oral Daily  . predniSONE  40 mg Oral Q breakfast  . sodium chloride  3 mL Intravenous Q12H  . sodium chloride  3 mL Intravenous Q12H  . tiotropium  18 mcg Inhalation Daily  . venlafaxine XR  75 mg Oral BID  . vitamin B-12  1,000 mcg Oral Daily   Continuous Infusions:    Assessment/Plan: 1. 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 Narrowed to doxycycline 5/31. 2. 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 Continue arformoterol 50 mcg twice a day, Pulmicort neb 0.5 twice a day, Robitussin 5 mouth every 4 when necessary cough, Spiriva 18 mcg daily, Xopenex 0.63 every 3 when necessary with a hope to wean back. Continue prednisone 40 mg daily with breakfast-slow taper over 1-2 weeks as out-patient 3. Hypertension continue diltiazem 240 mg daily, Bystolic 5 mg daily 4. History of CHF, compensated, although BNP elevated to 7067.  last echo 12/24/2011 EF 55-60%-hold further Lasix given AKI monitor daily weights 5. Acute kidney injury baseline creatinine 1.9 -seems to have plateud and tending down-potentially related to Bactrim use, blood loss in urine with UTI?, Use of antihypertensives and volume depletion-ultrasound nonspecific of kidneys 6. hematuria-patient is a 50 year pack smoker-suspicious for potential bladder malignancy. Repeat UA as an outpatient 7. CAd s/p PTCA in 04/2008-cautiously continue aspirin 81 mg daily, Plavix 75 mg daily. 8. Anemia-borderline microcytic-probably related to decreased  renal function. We'll get iron studies. Baseline seems to be and 8-10 range. She had a virtual colonoscopy 02/13/2012 which showed a distal sigmoid rectosigmoid soft tissue density? Non-tach stool colonic polyp-was recommended to have a followup virtual colonoscopy. 9. Leukocytosis-multifactorial potentially related to pneumonia/steroid use for COPD exacerbation. Monitor  Code Status: FUll Family Communication:  Discussed in detial with husband at bedside 5/31 He understands Disposition Plan:  Inpatient 1-2 more days- pedning creat trending down etc.   Pleas Koch, MD  [facility] Pager [phone] 11/03/2012, 12:42 PM    LOS: 2 days

## 2012-11-04 LAB — RENAL FUNCTION PANEL
BUN: 65 mg/dL — ABNORMAL HIGH (ref 6–23)
CO2: 23 mEq/L (ref 19–32)
Chloride: 95 mEq/L — ABNORMAL LOW (ref 96–112)
Creatinine, Ser: 3.23 mg/dL — ABNORMAL HIGH (ref 0.50–1.10)
Potassium: 3.7 mEq/L (ref 3.5–5.1)

## 2012-11-04 LAB — URINE CULTURE: Colony Count: 40000

## 2012-11-04 MED ORDER — LEVALBUTEROL HCL 0.63 MG/3ML IN NEBU: 0.6300 mg | INHALATION_SOLUTION | Freq: Four times a day (QID) | RESPIRATORY_TRACT | Status: DC | PRN

## 2012-11-04 MED ORDER — BISACODYL 10 MG RE SUPP
10.0000 mg | Freq: Once | RECTAL | Status: AC
  Administered 2012-11-04: 10 mg via RECTAL
  Filled 2012-11-04: qty 1

## 2012-11-04 MED ORDER — SODIUM CHLORIDE 0.9 % IV SOLN: 250.0000 mL | INTRAVENOUS | Status: DC | PRN

## 2012-11-04 NOTE — Progress Notes (Signed)
PROGRESS NOTE  Wanda STROLLO WUJ:[id number] DOB: 25-Jun-1940 DOA: 11/01/2012 PCP: Warrick Parisian, MD  Brief narrative: 72 yr old female admitted 11/01/28 with worsening SOB after being treated for a presumed UTI for 2 days and then being switched to Bactrim recently  Past medical history-As per Problem list Chart reviewed as below-  Admission 01/01/12 for COPD exacerbation, anemia-Noted creat at time was 2.0  Admission 04/17/08 with CAD s/p PTCA Mid-Right CA and 1st obtuse  S/p iliac stents 2004  PAD  Noted Abd aortic aneurysm + ulcerated plaque on cath 07/15/2008-was supposed to have seen Dr. Arbie Cookey   Consultants:  Telephone consulted Dr. Caryn Section of nephrology 5/30  Procedures:   portable chest x-ray 5/29 = ill-defined interstitial prominence right mid and lower lung similar but increased compared to prior? Interstitial lung disease -increasingly near bit bibasilar opacities left great than right.  Chest x-ray two-view 5/30 = no marked change in left greater than right airspace disease worrisome for pneumonia superimposed on COPD  Renal ultrasound 5/29 = no evidence of hydronephrosis, bilateral renal cortical thickening, bilateral nonobstructing nephrolithiasis, bilateral simple appearing cysts  Antibiotics:  Ceftriaxone 5/30  Azithromycin 5/30   Subjective  Doing fair-feels a little bit better today. A little tremulous in hands.  No n/v Denies CP NO blurred or double vision Passing stool and not sleeping well    Objective    Interim History: nad  Telemetry: Sinus rhythm without any issues  Objective: Filed Vitals:   11/03/12 2300 11/04/12 0500 11/04/12 0859 11/04/12 0942  BP: 155/64 175/59  116/65  Pulse:  65    Temp:  97.7 F (36.5 C)    TempSrc:  Oral    Resp:  17    Height: 5\' 8"  (1.727 m)     Weight:  86.41 kg (190 lb 8 oz)    SpO2:  99% 98%     Intake/Output Summary (Last 24 hours) at 11/04/12 1350 Last data filed at 11/04/12 1300  Gross per 24  hour  Intake    963 ml  Output    600 ml  Net    363 ml    Exam:  General: Alert pleasant ill-appearing Caucasian the female looking older than stated age Cardiovascular: S1-S2 no murmur rub or gallop Respiratory: Clinically clear Abdomen: Soft nontender nondistended Skin no lower extremity edema Neuro grossly intact  Data Reviewed: Basic Metabolic Panel:  Recent Labs Lab 11/01/12 1149 11/01/12 1621 11/02/12 0440 11/03/12 0445 11/04/12 0818  NA 133*  --  131* 132* 131*  K 3.7  --  4.0 3.9 3.7  CL 93*  --  91* 94* 95*  CO2 25  --  26 25 23   GLUCOSE 111*  --  120* 121* 110*  BUN 61*  --  65* 71* 65*  CREATININE 3.60* 3.80* 3.98* 3.72* 3.23*  CALCIUM 9.3  --  8.8 8.9 8.8  PHOS  --   --   --  4.1 3.7   Liver Function Tests:  Recent Labs Lab 11/03/12 0445 11/04/12 0818  ALBUMIN 2.9* 3.1*   No results found for this basename: LIPASE, AMYLASE,  in the last 168 hours No results found for this basename: AMMONIA,  in the last 168 hours CBC:  Recent Labs Lab 11/01/12 1149 11/01/12 1621 11/02/12 0440 11/03/12 0445  WBC 19.9* 13.7* 13.6* 15.9*  HGB 9.5* 8.6* 8.4* 8.5*  HCT 28.9* 26.2* 24.9* 25.7*  MCV 85.5 85.9 85.0 85.1  PLT 268 244 238 295   Cardiac Enzymes:  Recent Labs Lab 11/01/12 1621 11/01/12 2223 11/02/12 0440  TROPONINI <0.30 <0.30 <0.30   BNP: No components found with this basename: POCBNP,  CBG: No results found for this basename: GLUCAP,  in the last 168 hours  Recent Results (from the past 240 hour(s))  URINE CULTURE     Status: None   Collection Time    11/01/12  7:40 PM      Result Value Range Status   Specimen Description URINE, RANDOM   Final   Special Requests NONE   Final   Culture  Setup Time 11/01/2012 20:38   Final   Colony Count 40,000 COLONIES/ML   Final   Culture     Final   Value: STAPHYLOCOCCUS SPECIES (COAGULASE NEGATIVE)     Note: RIFAMPIN AND GENTAMICIN SHOULD NOT BE USED AS SINGLE DRUGS FOR TREATMENT OF STAPH  INFECTIONS.   Report Status PENDING   Incomplete     Studies:              All Imaging reviewed and is as per above notation   Scheduled Meds: . allopurinol  100 mg Oral Daily  . arformoterol  15 mcg Nebulization BID  . aspirin  81 mg Oral Daily  . atorvastatin  80 mg Oral q1800  . azithromycin  500 mg Oral Daily  . budesonide  0.5 mg Nebulization BID  . clopidogrel  75 mg Oral Daily  . diltiazem  240 mg Oral Daily  . enoxaparin (LOVENOX) injection  30 mg Subcutaneous Q24H  . guaiFENesin  600 mg Oral BID  . nebivolol  5 mg Oral Daily  . predniSONE  40 mg Oral Q breakfast  . sodium chloride  3 mL Intravenous Q12H  . sodium chloride  3 mL Intravenous Q12H  . tiotropium  18 mcg Inhalation Daily  . venlafaxine XR  75 mg Oral BID  . vitamin B-12  1,000 mcg Oral Daily   Continuous Infusions:    Assessment/Plan: 1. 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 Narrowed to .Azithromycin 500 daily 2. 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 Continue arformoterol 50 mcg twice a day, Pulmicort neb 0.5 twice a day, Robitussin 5 mouth every 4 when necessary cough, Spiriva 18 mcg daily, Xopenex 0.63 every 3 when necessary with a hope to wean back. Continue prednisone 40 mg daily with breakfast-slow taper over 1-2 weeks as out-patient 3. Hypertension continue diltiazem 240 mg daily, Bystolic 5 mg daily 4. History of CHF, compensated, although BNP elevated to 7067.  last echo 12/24/2011 EF 55-60%-hold further Lasix given AKI monitor daily weights 5. Acute kidney injury baseline creatinine 1.9 -seems to have plateud and tending down-potentially related to Bactrim use, blood loss in urine with UTI?, Use of antihypertensives and volume depletion-ultrasound nonspecific of kidneys 6. hematuria-patient is a 50 year pack smoker-suspicious for potential bladder malignancy. Repeat UA as an outpatient-would not  continue lasix as outpatient unless needed 7. CAd s/p PTCA in 04/2008-cautiously continue aspirin 81 mg daily, Plavix 75 mg daily. 8. Anemia-borderline microcytic-probably related to decreased renal function. We'll get iron studies. Baseline seems to be and 8-10 range. She had a virtual colonoscopy 02/13/2012 which showed a distal sigmoid rectosigmoid soft tissue density? Non-tach stool colonic polyp-was recommended to have a followup virtual colonoscopy. 9. Leukocytosis-multifactorial potentially related to pneumonia/steroid use for COPD exacerbation. Monitor  Code Status: FUll Family Communication:  Discussed in detail with husband at  bedside 5/31 He understands  Disposition Plan:  Inpatient 1-2 more days- pedning creat trending down etc.   Pleas Koch, MD  [facility] Pager [phone] 11/04/2012, 1:50 PM    LOS: 3 days

## 2012-11-05 LAB — RENAL FUNCTION PANEL
Albumin: 3 g/dL — ABNORMAL LOW (ref 3.5–5.2)
BUN: 60 mg/dL — ABNORMAL HIGH (ref 6–23)
Chloride: 96 mEq/L (ref 96–112)
GFR calc Af Amer: 17 mL/min — ABNORMAL LOW (ref >90)
Glucose, Bld: 113 mg/dL — ABNORMAL HIGH (ref 70–99)
Phosphorus: 3.9 mg/dL (ref 2.3–4.6)
Potassium: 4.1 mEq/L (ref 3.5–5.1)
Sodium: 133 mEq/L — ABNORMAL LOW (ref 135–145)

## 2012-11-05 MED ORDER — AZITHROMYCIN 500 MG PO TABS: 500.0000 mg | ORAL_TABLET | Freq: Every day | ORAL | Status: DC

## 2012-11-05 MED ORDER — PREDNISONE 20 MG PO TABS: 40.0000 mg | ORAL_TABLET | Freq: Every day | ORAL | Status: DC

## 2012-11-05 NOTE — Evaluation (Signed)
Occupational Therapy Evaluation Patient Details Name: Wanda Yang MRN: [id number] DOB: 25-Dec-1940 Today's Date: 11/05/2012 Time: 4098-1191 OT Time Calculation (min): 34 min  OT Assessment / Plan / Recommendation Clinical Impression  Pt admitted with PNA in the setting of COPD.  Pt has chronic back pain.  She was able to perform self care with setup to supervision.  Agree with recommendation by PT for a rollator and instructed pt in how it would benefit her ability to perform ADL and conserve energy.  No further OT needs.     OT Assessment  Patient does not need any further OT services    Follow Up Recommendations  No OT follow up;Supervision - Intermittent    Barriers to Discharge      Equipment Recommendations  None recommended by OT    Recommendations for Other Services    Frequency       Precautions / Restrictions Precautions Precautions: None   Pertinent Vitals/Pain On 2L 02, monitored throughout, no reports of pain    ADL  Eating/Feeding: Independent Where Assessed - Eating/Feeding: Chair Grooming: Brushing hair;Denture care;Modified independent Where Assessed - Grooming: Unsupported sitting Upper Body Bathing: Set up Where Assessed - Upper Body Bathing: Unsupported sitting Lower Body Bathing: Supervision/safety Where Assessed - Lower Body Bathing: Unsupported standing Upper Body Dressing: Set up Where Assessed - Upper Body Dressing: Unsupported sitting Lower Body Dressing: Set up Where Assessed - Lower Body Dressing: Unsupported sit to stand Toilet Transfer: Supervision/safety Toilet Transfer Method: Sit to Barista: Regular height toilet Toileting - Clothing Manipulation and Hygiene: Supervision/safety Where Assessed - Engineer, mining and Hygiene: Standing Equipment Used: Rolling walker (rollator, 02) Transfers/Ambulation Related to ADLs: supervision with rollator which is new to her ADL Comments: Pt is knowledgeable  in pacing, purse lip breathing, energy conservation strategies.  Educated pt on multiple uses of rollator to assist her with ADL.    OT Diagnosis:    OT Problem List:   OT Treatment Interventions:     OT Goals    Visit Information  Last OT Received On: 11/05/12 Assistance Needed: +1    Subjective Data  Subjective: "My back gives me more trouble than my walking." Patient Stated Goal: Home, return to PLOF.   Prior Functioning     Home Living Lives With: Spouse Available Help at Discharge: Family Type of Home: House Home Access: Stairs to enter [employer] of Steps: 7 Entrance Stairs-Rails: Left Home Layout: Two level;Able to live on main level with bedroom/bathroom Bathroom Shower/Tub: Walk-in shower;Door [employer] Toilet: Standard Bathroom Accessibility: Yes How Accessible: Accessible via walker Home Adaptive Equipment: Built-in shower seat;Hand-held shower hose;Walker - rolling Prior Function Level of Independence: Needs assistance Needs Assistance: Light Housekeeping;Meal Prep Meal Prep: Moderate Light Housekeeping: Moderate Able to Take Stairs?: Yes Driving: No Vocation: Retired Musician: No difficulties Dominant Hand: Right         Vision/Perception Vision - History Baseline Vision: Wears glasses all the time   Cognition  Cognition Arousal/Alertness: Awake/alert Behavior During Therapy: WFL for tasks assessed/performed Overall Cognitive Status: Within Functional Limits for tasks assessed    Extremity/Trunk Assessment Right Upper Extremity Assessment RUE ROM/Strength/Tone: WFL for tasks assessed RUE Coordination: WFL - gross/fine motor Left Upper Extremity Assessment LUE ROM/Strength/Tone: WFL for tasks assessed Trunk Assessment Trunk Assessment: Normal     Mobility Bed Mobility Bed Mobility: Not assessed Transfers Sit to Stand: 6: Modified independent (Device/Increase time);Without upper extremity assist;From  chair/3-in-1;From toilet Stand to Sit: 6: Modified independent (Device/Increase  time);With upper extremity assist;To chair/3-in-1;To toilet     Exercise     Balance Balance Balance Assessed: Yes Static Standing Balance Static Standing - Balance Support: No upper extremity supported;During functional activity Static Standing - Level of Assistance: 6: Modified independent (Device/Increase time)   End of Session OT - End of Session Activity Tolerance: Patient limited by fatigue Patient left: in chair;with call bell/phone within reach  GO     Evern Bio 11/05/2012, 9:59 AM [phone]

## 2012-11-05 NOTE — Discharge Summary (Signed)
Physician Discharge Summary  Wanda Yang ZOX:[id number] DOB: [birth year] DOA: 11/01/2012  PCP: Warrick Parisian, MD  Admit date: 11/01/2012 Discharge date: 11/05/2012   Time spent: 40 minutes  Recommendations for Outpatient Follow-up:  1. Complete azithromycin 11/07/2012 2. Get basic metabolic panel and one week 3. Continue COPD medications 4. Lasix discontinued this admission 5. Steroid taper of 40 mg prednisone given until 11/09/2012 then to continue 20 mg dosage as per prior prescription she has 6. Needs 3 in 1 rolling walker  Discharge Diagnoses:  Active Problems:   HYPERTENSION   CHF   COPD (chronic obstructive pulmonary disease)   GERD (gastroesophageal reflux disease)   Acute-on-chronic respiratory failure   Leukocytosis, unspecified   Acute on chronic renal failure   Hyponatremia   PNA (pneumonia)   Discharge Condition: Good  Diet recommendation: Healthy renal  Filed Weights   11/03/12 0500 11/04/12 0500 11/05/12 0500  Weight: 85.458 kg (188 lb 6.4 oz) 86.41 kg (190 lb 8 oz) 84.732 kg (186 lb 12.8 oz)    History of present illness:  72 yr old female admitted 11/01/28 with worsening SOB after being treated for a presumed UTI for 2 days and then being switched to Bactrim recently.  She received 2 days of Bactrim and was noted to have on admission cough with productive brown sputum fevers and chest x-ray in ED showed increasing bibasilar opacities WBC 19.9 and creatinine elevated   Hospital Course:  Assessment/Plan:  1. 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 Narrowed to .Azithromycin 500 daily- stop date = 11/08/2038 2. 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 Continue arformoterol 50 mcg twice a day, Pulmicort neb 0.5 twice a day, Robitussin 5 mouth every 4 when necessary cough, Spiriva 18 mcg daily, Xopenex 0.63 every 3 when necessary with a hope to wean back.  Continue prednisone 40 mg daily with breakfast-slow taper over 1-2 weeks as out-patient-he has prescription prednisone 20 mg daily which she should continue until she is seen by her regular 3. Hypertension continue diltiazem 240 mg daily, Bystolic 5 mg daily 4. History of CHF, compensated, although BNP elevated to 7067. last echo 12/24/2011 EF 55-60%-hold further Lasix given AKI monitor daily weights 5. Acute kidney injury baseline creatinine 1.9 -seems to have plateud and tending down-potentially related to Bactrim use, blood loss in urine with UTI?, Use of antihypertensives and volume depletion-ultrasound nonspecific of kidneys-her creatinine on discharge was 3. I expect his trend down somewhat. She will need another basic metabolic panel in about a week 6. hematuria-patient is a 50 year pack smoker-suspicious for potential bladder malignancy. Repeat UA as an outpatient-would not continue lasix as outpatient unless needed 7. CAd s/p PTCA in 04/2008-cautiously continue aspirin 81 mg daily, Plavix 75 mg daily. 8. Anemia-borderline microcytic-probably related to decreased renal function. We'll get iron studies. Baseline seems to be and 8-10 range. She had a virtual colonoscopy 02/13/2012 which showed a distal sigmoid rectosigmoid soft tissue density? Non-tach stool colonic polyp-was recommended to have a followup virtual colonoscopy. 9. Leukocytosis-multifactorial potentially related to pneumonia/steroid use for COPD exacerbation. Monitor   Consultants:  Telephone consulted Dr. Caryn Section of nephrology 5/30 Procedures:  portable chest x-ray 5/29 = ill-defined interstitial prominence right mid and lower lung similar but increased compared to prior? Interstitial lung disease -increasingly near bit bibasilar opacities left great than right.  Chest x-ray two-view 5/30 = no marked change in left greater than right airspace disease  worrisome for pneumonia superimposed on COPD  Renal ultrasound 5/29 = no evidence of  hydronephrosis, bilateral renal cortical thickening, bilateral nonobstructing nephrolithiasis, bilateral simple appearing cysts Antibiotics:  Ceftriaxone 5/30  Azithromycin 5/30   Discharge Exam: Filed Vitals:   11/05/12 0500 11/05/12 0848 11/05/12 0851 11/05/12 0917  BP: 169/72   135/68  Pulse: 76     Temp: 98 F (36.7 C)     TempSrc:      Resp: 20     Height:      Weight: 84.732 kg (186 lb 12.8 oz)     SpO2: 91% 85% 95%    Alert pleasant  oriented no apparent distress  General: Well no problems Cardiovascular: S1-S2 no murmur rub or gallop Respiratory: Clinically clear  Discharge Instructions  Discharge Orders   Future Appointments Provider Department Dept Phone   01/29/2013 10:45 AM Barbaraann Share, MD [facility] [phone]   Future Orders Complete By Expires     Call MD for:  difficulty breathing, headache or visual disturbances  As directed     Call MD for:  persistant dizziness or light-headedness  As directed     Call MD for:  persistant nausea and vomiting  As directed     Call MD for:  severe uncontrolled pain  As directed     Call MD for:  temperature >100.4  As directed     Diet - low sodium heart healthy  As directed     Discharge instructions  As directed     Comments:      Get labs done in a week-DOn't take any lasix. follow with Dr. Darrick Penna soon Complete the azithromycin-Get Cxr in about 1 month Continue your regular inhalers    Increase activity slowly  As directed         Medication List    TAKE these medications       albuterol 108 (90 BASE) MCG/ACT inhaler  Commonly known as:  PROVENTIL HFA;VENTOLIN HFA  Inhale 2 puffs into the lungs every 4 (four) hours as needed.     allopurinol 100 MG tablet  Commonly known as:  ZYLOPRIM  Take 100 mg by mouth 2 (two) times daily.     arformoterol 15 MCG/2ML Nebu  Commonly known as:  BROVANA  Take 2 mLs (15 mcg total) by nebulization 2 (two) times daily. DX  496     aspirin 81 MG tablet   Take 81 mg by mouth daily.     azithromycin 500 MG tablet  Commonly known as:  ZITHROMAX  Take 1 tablet (500 mg total) by mouth daily.     budesonide 0.5 MG/2ML nebulizer solution  Commonly known as:  PULMICORT  Take 2 mLs (0.5 mg total) by nebulization 2 (two) times daily. DX  496     Casanthranol-Docusate Sodium 30-100 MG Caps  Take 1 capsule by mouth daily as needed. For constipation     clopidogrel 75 MG tablet  Commonly known as:  PLAVIX  Take 1 tablet (75 mg total) by mouth daily.     diltiazem 240 MG 24 hr capsule  Commonly known as:  DILACOR XR  Take 1 capsule (240 mg total) by mouth daily.     famotidine 20 MG tablet  Commonly known as:  PEPCID  Take 20 mg by mouth as needed for heartburn.     furosemide 40 MG tablet  Commonly known as:  LASIX  Take 40 mg by mouth daily as needed. For weight gain  IRON PO  Take 90 mg by mouth daily.     nebivolol 10 MG tablet  Commonly known as:  BYSTOLIC  Take 5 mg by mouth daily.     nitroGLYCERIN 0.4 MG SL tablet  Commonly known as:  NITROSTAT  Place 1 tablet (0.4 mg total) under the tongue every 5 (five) minutes as needed.     polyethylene glycol packet  Commonly known as:  MIRALAX / GLYCOLAX  Take 17 g by mouth daily as needed. For constipation     predniSONE 20 MG tablet  Commonly known as:  DELTASONE  Take 2 tablets (40 mg total) by mouth daily with breakfast.     rosuvastatin 40 MG tablet  Commonly known as:  CRESTOR  Take 40 mg by mouth daily.     sulfamethoxazole-trimethoprim 800-160 MG per tablet  Commonly known as:  BACTRIM DS,SEPTRA DS  Take 1 tablet by mouth 2 (two) times daily.     tiotropium 18 MCG inhalation capsule  Commonly known as:  SPIRIVA HANDIHALER  Place 1 capsule (18 mcg total) into inhaler and inhale daily.     traMADol 50 MG tablet  Commonly known as:  ULTRAM  Take 50 mg by mouth every 6 (six) hours as needed for pain.     venlafaxine XR 75 MG 24 hr capsule  Commonly known as:   EFFEXOR-XR  Take 75 mg by mouth 2 (two) times daily.     vitamin B-12 1000 MCG tablet  Commonly known as:  CYANOCOBALAMIN  Take 1,000 mcg by mouth daily.       Allergies  Allergen Reactions  . Ciprofloxacin   . Uloric (Febuxostat)   . Vicodin (Hydrocodone-Acetaminophen)   . Zolpidem Tartrate   . Tetracycline Rash      The results of significant diagnostics from this hospitalization (including imaging, microbiology, ancillary and laboratory) are listed below for reference.    Significant Diagnostic Studies: X-ray Chest Pa And Lateral   11/02/2012   *RADIOLOGY REPORT*  Clinical Data: Shortness of breath, cough and weakness.  CHEST - 2 VIEW  Comparison: PA and lateral chest 11/01/2012 and 01/01/2012.  Findings: Left worse than right basilar airspace disease is again seen.  Aeration is not markedly changed.  The chest is hyperexpanded.  Heart size is normal.  IMPRESSION: No marked change in left worse than right basilar airspace disease worrisome for pneumonia superimposed on COPD.   Original Report Authenticated By: Holley Dexter, M.D.   Dg Chest 2 View  11/01/2012   *RADIOLOGY REPORT*  Clinical Data: Shortness of breath.  CHEST - 2 VIEW  Comparison: Chest x-ray 01/01/2012.  Findings: As with prior examinations there are areas of ill-defined interstitial prominence throughout the lung bases bilaterally, which have increased.  Linear opacities in the lung bases bilaterally also noted, also slightly increased, particularly on the left, which may represent worsening areas of atelectasis and/or chronic scarring.  Underlying air space consolidation in the left lower lobe is difficult to entirely exclude, but is not strongly favored.  No definite evidence of pulmonary edema.  Heart size is normal.  Upper mediastinal contours are within normal limits. Atherosclerosis in the thoracic aorta.  IMPRESSION: 1.  Diffuse ill-defined interstitial prominence throughout the mid and lower lungs bilaterally,  similar but increased compared to prior examinations, concerning for a developing interstitial lung disease.  This could be better evaluated with a non emergent high- resolution chest CT. 2.  Increasing linear bibasilar opacities (left greater than right), favored to reflect worsening  bibasilar scarring and/or areas of subsegmental atelectasis. 3.  Atherosclerosis.   Original Report Authenticated By: Trudie Reed, M.D.   US Renal  11/01/2012   *RADIOLOGY REPORT*  Clinical Data: Acute on chronic renal failure  RENAL/URINARY TRACT ULTRASOUND COMPLETE  Comparison: CT 02/13/2012  Findings:  Right Kidney = 8.1 cm.  No hydronephrosis.  There is several small anechoic cyst unchanged from prior.  There is a calculus in the lower pole.  Left kidney = 11.6 cm.  No hydronephrosis.  Small anechoic cyst noted.  There is a 6 mm calculus in the lower pole.  There is bilateral renal cortical thinning.  Bladder:  Bladder partially distended.  No irregularity identified.  IMPRESSION:  1.  No evidence of hydronephrosis.  2.   Bilateral renal cortical thinning. 3.  Bilateral nonobstructing nephrolithiasis. 4.  Bilateral simple appearing cysts.   Original Report Authenticated By: Genevive Bi, M.D.    Microbiology: Recent Results (from the past 240 hour(s))  URINE CULTURE     Status: None   Collection Time    11/01/12  7:40 PM      Result Value Range Status   Specimen Description URINE, RANDOM   Final   Special Requests NONE   Final   Culture  Setup Time 11/01/2012 20:38   Final   Colony Count 40,000 COLONIES/ML   Final   Culture     Final   Value: STAPHYLOCOCCUS SPECIES (COAGULASE NEGATIVE)     Note: RIFAMPIN AND GENTAMICIN SHOULD NOT BE USED AS SINGLE DRUGS FOR TREATMENT OF STAPH INFECTIONS.   Report Status 11/04/2012 FINAL   Final   Organism ID, Bacteria STAPHYLOCOCCUS SPECIES (COAGULASE NEGATIVE)   Final     Labs: Basic Metabolic Panel:  Recent Labs Lab 11/01/12 1149 11/01/12 1621 11/02/12 0440  11/03/12 0445 11/04/12 0818 11/05/12 0535  NA 133*  --  131* 132* 131* 133*  K 3.7  --  4.0 3.9 3.7 4.1  CL 93*  --  91* 94* 95* 96  CO2 25  --  26 25 23 24   GLUCOSE 111*  --  120* 121* 110* 113*  BUN 61*  --  65* 71* 65* 60*  CREATININE 3.60* 3.80* 3.98* 3.72* 3.23* 3.06*  CALCIUM 9.3  --  8.8 8.9 8.8 9.0  PHOS  --   --   --  4.1 3.7 3.9   Liver Function Tests:  Recent Labs Lab 11/03/12 0445 11/04/12 0818 11/05/12 0535  ALBUMIN 2.9* 3.1* 3.0*   No results found for this basename: LIPASE, AMYLASE,  in the last 168 hours No results found for this basename: AMMONIA,  in the last 168 hours CBC:  Recent Labs Lab 11/01/12 1149 11/01/12 1621 11/02/12 0440 11/03/12 0445  WBC 19.9* 13.7* 13.6* 15.9*  HGB 9.5* 8.6* 8.4* 8.5*  HCT 28.9* 26.2* 24.9* 25.7*  MCV 85.5 85.9 85.0 85.1  PLT 268 244 238 295   Cardiac Enzymes:  Recent Labs Lab 11/01/12 1621 11/01/12 2223 11/02/12 0440  TROPONINI <0.30 <0.30 <0.30   BNP: BNP (last 3 results)  Recent Labs  01/01/12 1515 01/02/12 0821 11/01/12 1149  PROBNP 1194.0* 885.3* 7067.0*   CBG: No results found for this basename: GLUCAP,  in the last 168 hours     Signed:  Rhetta Mura  [facility] 11/05/2012, 9:58 AM

## 2012-11-05 NOTE — Care Management Note (Signed)
    Page 1 of 1   11/05/2012     10:20:04 AM   CARE MANAGEMENT NOTE 11/05/2012  Patient:  Wanda Yang, Wanda Yang   Account Number:  [id number]  Date Initiated:  11/05/2012  Documentation initiated by:  GRAVES-BIGELOW,Celestia Duva  Subjective/Objective Assessment:   Pt admitted with COPD exacerbation. Plan to return home today.     Action/Plan:   CM did call [facility] for dme. DME to be delivered to room before d/c. NO further needs from CM at this time.   Anticipated DC Date:  11/05/2012   Anticipated DC Plan:  HOME/SELF CARE      DC Planning Services  CM consult      PAC Choice  DURABLE MEDICAL EQUIPMENT   Choice offered to / List presented to:             Status of service:  Completed, signed off Medicare Important Message given?   (If response is "NO", the following Medicare IM given date fields will be blank) Date Medicare IM given:   Date Additional Medicare IM given:    Discharge Disposition:  HOME/SELF CARE  Per UR Regulation:  Reviewed for med. necessity/level of care/duration of stay  If discussed at Long Length of Stay Meetings, dates discussed:    Comments:

## 2012-11-05 NOTE — Progress Notes (Signed)
Physical Therapy Treatment Patient Details Name: Wanda Yang MRN: [id number] DOB: 06-02-1941 Today's Date: 11/05/2012 Time: 1191-4782 PT Time Calculation (min): 24 min  PT Assessment / Plan / Recommendation Comments on Treatment Session  Pt adm with PNA and COPD.  Pt with improved mobility and endurance when using rollator.  Rollator allows pt to sit  and catch her breath when she needs to.      Follow Up Recommendations  No PT follow up     Does the patient have the potential to tolerate intense rehabilitation     Barriers to Discharge        Equipment Recommendations  Other (comment) (rollator)    Recommendations for Other Services    Frequency Min 3X/week   Plan Discharge plan remains appropriate;Frequency remains appropriate    Precautions / Restrictions Precautions Precautions: None   Pertinent Vitals/Pain See flow sheet    Mobility  Transfers Sit to Stand: 6: Modified independent (Device/Increase time);With upper extremity assist;With armrests;From chair/3-in-1 Stand to Sit: 6: Modified independent (Device/Increase time);With upper extremity assist;With armrests;To chair/3-in-1 Ambulation/Gait Ambulation/Gait Assistance: 6: Modified independent (Device/Increase time) Ambulation Distance (Feet): 150 Feet Assistive device: Rollator Ambulation/Gait Assistance Details: No cues needed for rollator. Gait Pattern: Step-through pattern;Decreased stride length    Exercises     PT Diagnosis:    PT Problem List:   PT Treatment Interventions:     PT Goals Acute Rehab PT Goals PT Goal: Sit to Stand - Progress: Met PT Goal: Stand to Sit - Progress: Met PT Goal: Ambulate - Progress: Met  Visit Information  Last PT Received On: 11/05/12 Assistance Needed: +1    Subjective Data  Subjective: Pt reports she uses her rescue inhaler at home prior to amb if she hasn't walked in a while.   Cognition  Cognition Arousal/Alertness: Awake/alert Behavior During Therapy:  WFL for tasks assessed/performed Overall Cognitive Status: Within Functional Limits for tasks assessed    Balance  Balance Balance Assessed: Yes Static Standing Balance Static Standing - Balance Support: No upper extremity supported;During functional activity Static Standing - Level of Assistance: 6: Modified independent (Device/Increase time)  End of Session PT - End of Session Equipment Utilized During Treatment: Oxygen Activity Tolerance: Patient limited by fatigue Patient left: in chair Nurse Communication: Mobility status;Other (comment) (recommendation for rollator)   GP     Hervey Wedig 11/05/2012, 9:12 AM  [facility] PT [phone]

## 2012-11-05 NOTE — Progress Notes (Signed)
Pt provided with dc instructions and education. Pt verbalized understanding. IV removed with tip intact. Heart monitor cleaned and returned to frotn. Pt waiting for DME prior to leaving. Levonne Spiller, RN

## 2012-11-23 ENCOUNTER — Other Ambulatory Visit (HOSPITAL_COMMUNITY): Payer: Self-pay | Admitting: *Deleted

## 2012-11-26 ENCOUNTER — Encounter (HOSPITAL_COMMUNITY)
Admission: RE | Admit: 2012-11-26 | Discharge: 2012-11-26 | Disposition: A | Discharge status: Home / Self Care | Payer: Federal, State, Local not specified - PPO | Source: Ambulatory Visit | Attending: Nephrology | Admitting: Nephrology

## 2012-11-26 DIAGNOSIS — I2510 Atherosclerotic heart disease of native coronary artery without angina pectoris: Secondary | ICD-10-CM | POA: Insufficient documentation

## 2012-11-26 DIAGNOSIS — I129 Hypertensive chronic kidney disease with stage 1 through stage 4 chronic kidney disease, or unspecified chronic kidney disease: Secondary | ICD-10-CM | POA: Insufficient documentation

## 2012-11-26 DIAGNOSIS — Z9981 Dependence on supplemental oxygen: Secondary | ICD-10-CM | POA: Insufficient documentation

## 2012-11-26 DIAGNOSIS — J9610 Chronic respiratory failure, unspecified whether with hypoxia or hypercapnia: Secondary | ICD-10-CM | POA: Insufficient documentation

## 2012-11-26 DIAGNOSIS — I700 Atherosclerosis of aorta: Secondary | ICD-10-CM | POA: Insufficient documentation

## 2012-11-26 DIAGNOSIS — K219 Gastro-esophageal reflux disease without esophagitis: Secondary | ICD-10-CM | POA: Insufficient documentation

## 2012-11-26 DIAGNOSIS — E785 Hyperlipidemia, unspecified: Secondary | ICD-10-CM | POA: Insufficient documentation

## 2012-11-26 DIAGNOSIS — J4489 Other specified chronic obstructive pulmonary disease: Secondary | ICD-10-CM | POA: Insufficient documentation

## 2012-11-26 DIAGNOSIS — J449 Chronic obstructive pulmonary disease, unspecified: Secondary | ICD-10-CM | POA: Insufficient documentation

## 2012-11-26 DIAGNOSIS — Z8542 Personal history of malignant neoplasm of other parts of uterus: Secondary | ICD-10-CM | POA: Insufficient documentation

## 2012-11-26 DIAGNOSIS — F411 Generalized anxiety disorder: Secondary | ICD-10-CM | POA: Insufficient documentation

## 2012-11-26 DIAGNOSIS — F17200 Nicotine dependence, unspecified, uncomplicated: Secondary | ICD-10-CM | POA: Insufficient documentation

## 2012-11-26 DIAGNOSIS — I739 Peripheral vascular disease, unspecified: Secondary | ICD-10-CM | POA: Insufficient documentation

## 2012-11-26 DIAGNOSIS — D649 Anemia, unspecified: Secondary | ICD-10-CM | POA: Insufficient documentation

## 2012-11-26 DIAGNOSIS — N189 Chronic kidney disease, unspecified: Secondary | ICD-10-CM | POA: Insufficient documentation

## 2012-11-26 MED ORDER — SODIUM CHLORIDE 0.9 % IV SOLN
1020.0000 mg | Freq: Once | INTRAVENOUS | Status: AC
  Administered 2012-11-26: 1020 mg via INTRAVENOUS
  Filled 2012-11-26: qty 34

## 2012-12-20 ENCOUNTER — Other Ambulatory Visit (HOSPITAL_COMMUNITY): Payer: Self-pay | Admitting: *Deleted

## 2012-12-21 ENCOUNTER — Encounter (HOSPITAL_COMMUNITY)
Admission: RE | Admit: 2012-12-21 | Discharge: 2012-12-21 | Disposition: A | Discharge status: Home / Self Care | Payer: Federal, State, Local not specified - PPO | Source: Ambulatory Visit | Attending: Nephrology | Admitting: Nephrology

## 2012-12-21 DIAGNOSIS — K219 Gastro-esophageal reflux disease without esophagitis: Secondary | ICD-10-CM | POA: Insufficient documentation

## 2012-12-21 DIAGNOSIS — N189 Chronic kidney disease, unspecified: Secondary | ICD-10-CM | POA: Insufficient documentation

## 2012-12-21 DIAGNOSIS — D649 Anemia, unspecified: Secondary | ICD-10-CM | POA: Insufficient documentation

## 2012-12-21 DIAGNOSIS — F411 Generalized anxiety disorder: Secondary | ICD-10-CM | POA: Insufficient documentation

## 2012-12-21 DIAGNOSIS — I2510 Atherosclerotic heart disease of native coronary artery without angina pectoris: Secondary | ICD-10-CM | POA: Insufficient documentation

## 2012-12-21 DIAGNOSIS — Z8542 Personal history of malignant neoplasm of other parts of uterus: Secondary | ICD-10-CM | POA: Insufficient documentation

## 2012-12-21 DIAGNOSIS — I129 Hypertensive chronic kidney disease with stage 1 through stage 4 chronic kidney disease, or unspecified chronic kidney disease: Secondary | ICD-10-CM | POA: Insufficient documentation

## 2012-12-21 DIAGNOSIS — I700 Atherosclerosis of aorta: Secondary | ICD-10-CM | POA: Insufficient documentation

## 2012-12-21 DIAGNOSIS — E785 Hyperlipidemia, unspecified: Secondary | ICD-10-CM | POA: Insufficient documentation

## 2012-12-21 DIAGNOSIS — I739 Peripheral vascular disease, unspecified: Secondary | ICD-10-CM | POA: Insufficient documentation

## 2012-12-21 DIAGNOSIS — J9610 Chronic respiratory failure, unspecified whether with hypoxia or hypercapnia: Secondary | ICD-10-CM | POA: Insufficient documentation

## 2012-12-21 DIAGNOSIS — Z9981 Dependence on supplemental oxygen: Secondary | ICD-10-CM | POA: Insufficient documentation

## 2012-12-21 DIAGNOSIS — J449 Chronic obstructive pulmonary disease, unspecified: Secondary | ICD-10-CM | POA: Insufficient documentation

## 2012-12-21 DIAGNOSIS — J4489 Other specified chronic obstructive pulmonary disease: Secondary | ICD-10-CM | POA: Insufficient documentation

## 2012-12-21 DIAGNOSIS — F17200 Nicotine dependence, unspecified, uncomplicated: Secondary | ICD-10-CM | POA: Insufficient documentation

## 2012-12-21 MED ORDER — SODIUM CHLORIDE 0.9 % IV SOLN
1020.0000 mg | Freq: Once | INTRAVENOUS | Status: AC
  Administered 2012-12-21: 1020 mg via INTRAVENOUS
  Filled 2012-12-21 (×2): qty 34

## 2013-01-15 ENCOUNTER — Other Ambulatory Visit: Payer: Self-pay | Admitting: Cardiovascular Disease

## 2013-01-18 ENCOUNTER — Other Ambulatory Visit: Payer: Self-pay | Admitting: Cardiovascular Disease

## 2013-01-29 ENCOUNTER — Ambulatory Visit (INDEPENDENT_AMBULATORY_CARE_PROVIDER_SITE_OTHER): Payer: Federal, State, Local not specified - PPO | Admitting: Pulmonary Disease

## 2013-01-29 ENCOUNTER — Encounter: Payer: Self-pay | Admitting: Pulmonary Disease

## 2013-01-29 VITALS — BP 128/90 | HR 80 | Temp 98.1°F | Ht 68.0 in | Wt 184.6 lb

## 2013-01-29 DIAGNOSIS — J9610 Chronic respiratory failure, unspecified whether with hypoxia or hypercapnia: Secondary | ICD-10-CM

## 2013-01-29 DIAGNOSIS — J449 Chronic obstructive pulmonary disease, unspecified: Secondary | ICD-10-CM

## 2013-01-29 NOTE — Patient Instructions (Addendum)
Stay on your current breathing medications.  Try and take your nebulizer treatments am and late afternoon. Stop smoking.  This is the key for you to stay well and breathe better. followup with me in 6mos.

## 2013-01-29 NOTE — Progress Notes (Signed)
  Subjective:    Patient ID: Wanda Yang, female    DOB: [birth year], 72 y.o.   MRN: [id number]  HPI The patient comes in today for followup of her known COPD with chronic respiratory failure.  She is not taking her nebulized bronchodilators as compliantly as she should, and unfortunately continues to smoke.  She has not had an acute exacerbation or pulmonary infection since last visit.  She is staying on her oxygen.  The patient feels that her breathing is near her usual baseline.   Review of Systems  Constitutional: Negative for fever and unexpected weight change.  HENT: Negative for ear pain, nosebleeds, congestion, sore throat, rhinorrhea, sneezing, trouble swallowing, dental problem, postnasal drip and sinus pressure.   Eyes: Negative for redness and itching.  Respiratory: Positive for shortness of breath. Negative for cough, chest tightness and wheezing.   Cardiovascular: Negative for palpitations and leg swelling.  Gastrointestinal: Negative for nausea and vomiting.  Genitourinary: Negative for dysuria.  Musculoskeletal: Negative for joint swelling.  Skin: Negative for rash.  Neurological: Negative for headaches.  Hematological: Does not bruise/bleed easily.  Psychiatric/Behavioral: Negative for dysphoric mood. The patient is not nervous/anxious.        Objective:   Physical Exam Well-developed female in no acute distress Nose without purulent discharge noted Neck without lymphadenopathy or thyromegaly Chest with decreased breath sounds throughout, no wheezes or crackles Cardiac exam with regular rate and rhythm Lower extremities with mild edema, no cyanosis Alert and oriented, moves all 4 extremities.       Assessment & Plan:

## 2013-01-29 NOTE — Assessment & Plan Note (Signed)
The patient appears to be at her usual baseline from a pulmonary standpoint.  I have asked her to be more compliant with her nebulized bronchodilators, and also to work hard on smoking cessation.  I have reviewed the impact of tobacco on her current health issues, and discussed various ways to consider quitting.

## 2013-03-29 ENCOUNTER — Other Ambulatory Visit: Payer: Self-pay | Admitting: Pulmonary Disease

## 2013-04-11 ENCOUNTER — Other Ambulatory Visit: Payer: Self-pay

## 2013-06-03 ENCOUNTER — Other Ambulatory Visit: Payer: Self-pay | Admitting: *Deleted

## 2013-06-03 ENCOUNTER — Telehealth: Payer: Self-pay | Admitting: Cardiovascular Disease

## 2013-06-03 MED ORDER — ROSUVASTATIN CALCIUM 20 MG PO TABS: 20.0000 mg | ORAL_TABLET | Freq: Every day | ORAL | Status: DC

## 2013-06-03 NOTE — Telephone Encounter (Signed)
Follow Up  Pt husband states that he remembers Dr. Elease Hashimoto suggesting that the generic was not a good alternative for his wife. Would like to keep the original script for Crestor// transferred to the medications department for refills.

## 2013-06-03 NOTE — Telephone Encounter (Signed)
PT would like to switch to less expensive med, pt aware dr Elease Hashimoto is out of office till next week and will be advised then/ pt agreed to plan.

## 2013-06-03 NOTE — Telephone Encounter (Signed)
New Message  Pt husband called states that the pt's Crestor is running low// refills// requests a  generic form sent to [facility] mail order.. 90 day supply// new script needed// Please assist

## 2013-07-12 ENCOUNTER — Other Ambulatory Visit: Payer: Self-pay | Admitting: *Deleted

## 2013-07-12 DIAGNOSIS — E785 Hyperlipidemia, unspecified: Secondary | ICD-10-CM

## 2013-07-12 DIAGNOSIS — I2510 Atherosclerotic heart disease of native coronary artery without angina pectoris: Secondary | ICD-10-CM

## 2013-07-12 NOTE — Progress Notes (Signed)
Lab orders placed.  

## 2013-07-15 ENCOUNTER — Other Ambulatory Visit: Payer: Federal, State, Local not specified - PPO

## 2013-07-15 ENCOUNTER — Ambulatory Visit (INDEPENDENT_AMBULATORY_CARE_PROVIDER_SITE_OTHER): Payer: Federal, State, Local not specified - PPO | Admitting: Cardiovascular Disease

## 2013-07-15 ENCOUNTER — Encounter: Payer: Self-pay | Admitting: Cardiovascular Disease

## 2013-07-15 ENCOUNTER — Encounter (INDEPENDENT_AMBULATORY_CARE_PROVIDER_SITE_OTHER): Payer: Self-pay

## 2013-07-15 VITALS — BP 106/90 | HR 74 | Ht 68.0 in | Wt 177.0 lb

## 2013-07-15 DIAGNOSIS — I2510 Atherosclerotic heart disease of native coronary artery without angina pectoris: Secondary | ICD-10-CM

## 2013-07-15 DIAGNOSIS — J449 Chronic obstructive pulmonary disease, unspecified: Secondary | ICD-10-CM

## 2013-07-15 MED ORDER — ROSUVASTATIN CALCIUM 20 MG PO TABS: 20.0000 mg | ORAL_TABLET | Freq: Every day | ORAL | Status: DC

## 2013-07-15 MED ORDER — DILTIAZEM HCL ER 240 MG PO CP24: 240.0000 mg | ORAL_CAPSULE | Freq: Every day | ORAL | Status: DC

## 2013-07-15 MED ORDER — NEBIVOLOL HCL 5 MG PO TABS: ORAL_TABLET | ORAL | Status: DC

## 2013-07-15 MED ORDER — CLOPIDOGREL BISULFATE 75 MG PO TABS: 75.0000 mg | ORAL_TABLET | Freq: Every day | ORAL | Status: DC

## 2013-07-15 NOTE — Assessment & Plan Note (Signed)
She has severe lung disease and has severe COPD. She's on home oxygen. She continues to smoke.  Once again, I've encouraged her to stop smoking.

## 2013-07-15 NOTE — Assessment & Plan Note (Signed)
She was doing okay from a cardiac standpoint. She's not had any episodes of chest discomfort.  At times a permanent handicap placard application for her. This is because of her limited exertional capacity do to coronary artery disease and her severe COPD.  Once again of request that she stop smoking. I do not think that she's ready but she did inquire about the smoking cessation classes at [facility] given her the phone number.  I ll see her in 6 months for OV and fasting labs and ECG.

## 2013-07-15 NOTE — Progress Notes (Signed)
Wanda Yang  Date of Birth  09-03-1940 [facility] [address]    [place], [place]  [address] [phone]  Fax  [phone]  Problem: 1. Coronary artery disease-status post PTCA and stenting of her left complex artery as well as right coronary artery 2. Abdominal aortic ulceration/atherosclerosis 3. Continued cigarette use-even while using home oxygen 4. Hypertension 5. COPD 6. Gout- becomes dyspneic with allopurinol 7. PVD,  S/p iliac stenting.  History of Present Illness:  73 yo female with hx of CAD - s/p PCI.  She has a significant history of COPD.  she continues to smoke. She quit for about 4 weeks but unfortunately started back.  Has a history of hypertension. Her blood pressure readings have been low separately.   She has a history of hyperlipidemia. Her medical doctor increased her Crestor to 40 mg a day at her last visit.  She is having some gout pain in her feet but the crestor did not seem to worsen these pains.  She complains of leg fatigue. She does not have any claudication.   She also complains of burning in both her feet and toes.  She also has had some cyanosis in her toes.  She's been wearing compression hose and this seems to help herdecrease the swelling and cyanosis in her feet and toes.  She also has a history of an abdominal aortic ulceration.  She continues to have significant dyspnea. She is very short of breath when she wakes up in the morning that she gradually feels better and is able to catch her breath.  She's been taking some allopurinol but apparently this makes her even more short of breath.  October 02, 2012: Bassy is having some chest tightness.  She has trouble breathing on rainy days.  The discomfort is not similar to her angina and she has not taken a NTG.    She decreased her crestor back down to to 20 mg a day because of arm weakness and soreness.    Feb. 9, 2015:  She has brought a handicap parking placard  application with her today.  She continues to get more and more fatigued.   No CP.    Current Outpatient Prescriptions on File Prior to Visit  Medication Sig Dispense Refill  . albuterol (PROVENTIL HFA;VENTOLIN HFA) 108 (90 BASE) MCG/ACT inhaler Inhale 2 puffs into the lungs every 4 (four) hours as needed.  1 Inhaler  3  . allopurinol (ZYLOPRIM) 100 MG tablet Take 100 mg by mouth 2 (two) times daily.        Marland Kitchen arformoterol (BROVANA) 15 MCG/2ML NEBU Take 2 mLs (15 mcg total) by nebulization 2 (two) times daily. DX  496  360 mL  3  . aspirin 81 MG tablet Take 81 mg by mouth daily.        . budesonide (PULMICORT) 0.5 MG/2ML nebulizer solution INHALE THE CONTENTS OF 1   RESPULE BY NEBULIZATION TWOTIMES A DAY  360 mL  3  . Casanthranol-Docusate Sodium 30-100 MG CAPS Take 1 capsule by mouth daily as needed. For constipation      . famotidine (PEPCID) 20 MG tablet Take 20 mg by mouth as needed for heartburn.      . furosemide (LASIX) 40 MG tablet Take 40 mg by mouth daily as needed. For weight gain      . IRON PO Take 90 mg by mouth daily.       . nitroGLYCERIN (NITROSTAT) 0.4 MG SL  tablet Place 1 tablet (0.4 mg total) under the tongue every 5 (five) minutes as needed.  25 tablet  3  . polyethylene glycol (MIRALAX / GLYCOLAX) packet Take 17 g by mouth daily as needed. For constipation      . tiotropium (SPIRIVA HANDIHALER) 18 MCG inhalation capsule Place 1 capsule (18 mcg total) into inhaler and inhale daily.  90 capsule  3  . venlafaxine XR (EFFEXOR-XR) 75 MG 24 hr capsule Take 75 mg by mouth 2 (two) times daily.      . vitamin B-12 (CYANOCOBALAMIN) 1000 MCG tablet Take 1,000 mcg by mouth daily.       No current facility-administered medications on file prior to visit.    Allergies  Allergen Reactions  . Ciprofloxacin   . Uloric [Febuxostat]   . Vicodin [Hydrocodone-Acetaminophen]   . Zolpidem Tartrate   . Tetracycline Rash    Past Medical History  Diagnosis Date  . CAD (coronary artery  disease)   . Atherosclerosis of abdominal aorta   . On home oxygen therapy   . Tobacco abuse   . HTN (hypertension)   . COPD (chronic obstructive pulmonary disease)   . PVD (peripheral vascular disease)   . Anxiety   . GERD (gastroesophageal reflux disease)   . RBBB (right bundle branch block)   . Tachycardia   . High cholesterol   . Renal insufficiency   . Leukocytosis   . Endometrial carcinoma   . CHF (congestive heart failure)   . Myocardial infarction     " MILD "  . Shortness of breath   . UTI (urinary tract infection)     Past Surgical History  Procedure Laterality Date  . Coronary angioplasty    . Abdominal hysterectomy    . Carotid stent    . Tonsillectomy    . Arm surgery      PLATES AND SCREWS  . Breast reduction surgery    . Femoral artery stent      History  Smoking status  . Current Every Day Smoker -- 1.00 packs/day for 53 years  . Types: Cigarettes  Smokeless tobacco  . Never Used    Comment: currently smoking 3/4 ppd.     History  Alcohol Use  . Yes    Comment: occassional    Family History  Problem Relation Age of Onset  . Heart attack Father   . Coronary artery disease Father   . Leukemia Mother   . Leukemia Brother     Reviw of Systems:  Reviewed in the HPI.  All other systems are negative.  Physical Exam: BP 106/90  Pulse 74  Ht 5\' 8"  (1.727 m)  Wt 177 lb (80.287 kg)  BMI 26.92 kg/m2  SpO2 98% The patient is alert and oriented x 3.  The mood and affect are normal.   Skin: warm and dry.  Color is normal.    HEENT:   Normal carotids, no jvd  Lungs: decreased breath sounds in the upper lung fields   Heart: RR    Abdomen: non tender, normal BS  Extremities:  Trace- 1-2+ edema  Neuro:  Normal.    EKG: 03/20/2012-normal sinus rhythm at 71 beats a minute. She has a right bundle branch block.  Assessment / Plan:

## 2013-07-15 NOTE — Patient Instructions (Addendum)
Call the [facility] Quit line to help you stop smoking.   [phone].  Your physician wants you to follow-up in: 6 MONTHS  You will receive a reminder letter in the mail two months in advance. If you don't receive a letter, please call our office to schedule the follow-up appointment.  Your physician recommends that you return for a FASTING lipid profile: 6 MONTHS   Your physician recommends that you continue on your current medications as directed. Please refer to the Current Medication list given to you today.

## 2013-08-01 ENCOUNTER — Ambulatory Visit: Payer: Federal, State, Local not specified - PPO | Admitting: Pulmonary Disease

## 2013-08-20 ENCOUNTER — Encounter: Payer: Self-pay | Admitting: Pulmonary Disease

## 2013-08-20 ENCOUNTER — Ambulatory Visit (INDEPENDENT_AMBULATORY_CARE_PROVIDER_SITE_OTHER): Payer: Federal, State, Local not specified - PPO | Admitting: Pulmonary Disease

## 2013-08-20 VITALS — BP 122/78 | HR 78 | Temp 97.5°F | Ht 68.0 in | Wt 179.0 lb

## 2013-08-20 DIAGNOSIS — J9610 Chronic respiratory failure, unspecified whether with hypoxia or hypercapnia: Secondary | ICD-10-CM

## 2013-08-20 DIAGNOSIS — J4489 Other specified chronic obstructive pulmonary disease: Secondary | ICD-10-CM

## 2013-08-20 DIAGNOSIS — J449 Chronic obstructive pulmonary disease, unspecified: Secondary | ICD-10-CM

## 2013-08-20 NOTE — Assessment & Plan Note (Signed)
The patient is stable from a pulmonary standpoint currently, but he is continuing to smoke. He does not have any acute bronchospasm on exam today, nor has she had a recent pulmonary infection or exacerbation. I've asked her to continue on her current meds, try and stay as active as possible, and to work aggressively on smoking cessation.

## 2013-08-20 NOTE — Patient Instructions (Signed)
No change in breathing meds. Try and stop smoking 100%. Stay as active as possible. followup with me again in 89mos.

## 2013-08-20 NOTE — Progress Notes (Signed)
   Subjective:    Patient ID: Wanda Yang, female    DOB: May 27, 1941, 73 y.o.   MRN: [id number]  HPI The patient comes in today for followup of her known COPD with chronic respiratory failure. Unfortunately, she continues to smoke, but appears to be maintaining her usual baseline. She denies any increased shortness of breath or acute exacerbation since her last visit. She is staying on her bronchodilator regimen, but is not getting a lot of activity.   Review of Systems  Constitutional: Negative for fever and unexpected weight change.  HENT: Positive for postnasal drip. Negative for congestion, dental problem, ear pain, nosebleeds, rhinorrhea, sinus pressure, sneezing, sore throat and trouble swallowing.   Eyes: Negative for redness and itching.  Respiratory: Positive for cough. Negative for chest tightness, shortness of breath and wheezing.   Cardiovascular: Positive for leg swelling ( feet). Negative for palpitations.  Gastrointestinal: Negative for nausea and vomiting.  Genitourinary: Negative for dysuria.  Musculoskeletal: Negative for joint swelling.  Skin: Negative for rash.  Neurological: Negative for headaches.  Hematological: Does not bruise/bleed easily.  Psychiatric/Behavioral: Positive for dysphoric mood. The patient is nervous/anxious.        Objective:   Physical Exam Well-developed female in no acute distress Nose without purulence or discharge noted Neck without lymphadenopathy or thyromegaly Chest with decreased breath sounds, no active wheezing or crackles Cardiac exam with regular rate and rhythm Lower extremities with 1+ edema, no cyanosis Alert and oriented, moves all 4 extremities.       Assessment & Plan:

## 2013-09-11 ENCOUNTER — Other Ambulatory Visit: Payer: Self-pay | Admitting: Dermatology

## 2013-09-13 ENCOUNTER — Encounter (HOSPITAL_COMMUNITY): Payer: Self-pay | Admitting: Emergency Medicine

## 2013-09-13 ENCOUNTER — Emergency Department (HOSPITAL_COMMUNITY)
Admission: EM | Admit: 2013-09-13 | Discharge: 2013-09-13 | Disposition: A | Discharge status: Home / Self Care | Payer: Federal, State, Local not specified - PPO | Attending: Emergency Medicine | Admitting: Emergency Medicine

## 2013-09-13 DIAGNOSIS — Z9861 Coronary angioplasty status: Secondary | ICD-10-CM | POA: Insufficient documentation

## 2013-09-13 DIAGNOSIS — K219 Gastro-esophageal reflux disease without esophagitis: Secondary | ICD-10-CM | POA: Insufficient documentation

## 2013-09-13 DIAGNOSIS — R079 Chest pain, unspecified: Secondary | ICD-10-CM | POA: Insufficient documentation

## 2013-09-13 DIAGNOSIS — I10 Essential (primary) hypertension: Secondary | ICD-10-CM | POA: Insufficient documentation

## 2013-09-13 DIAGNOSIS — J441 Chronic obstructive pulmonary disease with (acute) exacerbation: Secondary | ICD-10-CM | POA: Insufficient documentation

## 2013-09-13 DIAGNOSIS — Z7902 Long term (current) use of antithrombotics/antiplatelets: Secondary | ICD-10-CM | POA: Insufficient documentation

## 2013-09-13 DIAGNOSIS — I509 Heart failure, unspecified: Secondary | ICD-10-CM | POA: Insufficient documentation

## 2013-09-13 DIAGNOSIS — Z87448 Personal history of other diseases of urinary system: Secondary | ICD-10-CM | POA: Insufficient documentation

## 2013-09-13 DIAGNOSIS — T148XXA Other injury of unspecified body region, initial encounter: Secondary | ICD-10-CM

## 2013-09-13 DIAGNOSIS — Z8542 Personal history of malignant neoplasm of other parts of uterus: Secondary | ICD-10-CM | POA: Insufficient documentation

## 2013-09-13 DIAGNOSIS — I252 Old myocardial infarction: Secondary | ICD-10-CM | POA: Insufficient documentation

## 2013-09-13 DIAGNOSIS — Y838 Other surgical procedures as the cause of abnormal reaction of the patient, or of later complication, without mention of misadventure at the time of the procedure: Secondary | ICD-10-CM | POA: Insufficient documentation

## 2013-09-13 DIAGNOSIS — Z7901 Long term (current) use of anticoagulants: Secondary | ICD-10-CM

## 2013-09-13 DIAGNOSIS — Z9981 Dependence on supplemental oxygen: Secondary | ICD-10-CM | POA: Insufficient documentation

## 2013-09-13 DIAGNOSIS — Z7982 Long term (current) use of aspirin: Secondary | ICD-10-CM | POA: Insufficient documentation

## 2013-09-13 DIAGNOSIS — Z79899 Other long term (current) drug therapy: Secondary | ICD-10-CM | POA: Insufficient documentation

## 2013-09-13 DIAGNOSIS — Z87440 Personal history of urinary (tract) infections: Secondary | ICD-10-CM | POA: Insufficient documentation

## 2013-09-13 DIAGNOSIS — F17200 Nicotine dependence, unspecified, uncomplicated: Secondary | ICD-10-CM | POA: Insufficient documentation

## 2013-09-13 DIAGNOSIS — R21 Rash and other nonspecific skin eruption: Secondary | ICD-10-CM | POA: Insufficient documentation

## 2013-09-13 DIAGNOSIS — F411 Generalized anxiety disorder: Secondary | ICD-10-CM | POA: Insufficient documentation

## 2013-09-13 DIAGNOSIS — D689 Coagulation defect, unspecified: Secondary | ICD-10-CM | POA: Insufficient documentation

## 2013-09-13 DIAGNOSIS — E7800 Pure hypercholesterolemia, unspecified: Secondary | ICD-10-CM | POA: Insufficient documentation

## 2013-09-13 DIAGNOSIS — IMO0002 Reserved for concepts with insufficient information to code with codable children: Secondary | ICD-10-CM | POA: Insufficient documentation

## 2013-09-13 DIAGNOSIS — I2510 Atherosclerotic heart disease of native coronary artery without angina pectoris: Secondary | ICD-10-CM | POA: Insufficient documentation

## 2013-09-13 DIAGNOSIS — G8929 Other chronic pain: Secondary | ICD-10-CM | POA: Insufficient documentation

## 2013-09-13 NOTE — ED Notes (Signed)
Pt states she had a mole removed from her left shoulder on Wednesday and cannot get it to quit bleeding  Pt states she does take blood thinners, Plavix and aspirin

## 2013-09-13 NOTE — ED Provider Notes (Signed)
CSN: [id number]     Arrival date & time 09/13/13  6295 History   First MD Initiated Contact with Patient 09/13/13 [phone]     Chief Complaint  Patient presents with  . Coagulation Disorder     (Consider location/radiation/quality/duration/timing/severity/associated sxs/prior Treatment) The history is provided by the patient.    Pt on plavix and aspirin presents with continued bleeding following mole removal by dermatologist two days ago.  Pt states she attempted to call the dermatologist Dr Tonia Brooms yesterday but did not receive a return call.  Area has been continuously oozing.  Pt otherwise is feeling well, no change in her baseline.  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 lightheadedness) but she has these every time she takes prednisone.     Past Medical History  Diagnosis Date  . CAD (coronary artery disease)   . Atherosclerosis of abdominal aorta   . On home oxygen therapy   . Tobacco abuse   . HTN (hypertension)   . COPD (chronic obstructive pulmonary disease)   . PVD (peripheral vascular disease)   . Anxiety   . GERD (gastroesophageal reflux disease)   . RBBB (right bundle branch block)   . Tachycardia   . High cholesterol   . Renal insufficiency   . Leukocytosis   . Endometrial carcinoma   . CHF (congestive heart failure)   . Myocardial infarction     " MILD "  . Shortness of breath   . UTI (urinary tract infection)    Past Surgical History  Procedure Laterality Date  . Coronary angioplasty    . Abdominal hysterectomy    . Carotid stent    . Tonsillectomy    . Arm surgery      PLATES AND SCREWS  . Breast reduction surgery    . Femoral artery stent     Family History  Problem Relation Age of Onset  . Heart attack Father   . Coronary artery disease Father   . Leukemia Mother   . Leukemia Brother    History  Substance Use Topics  . Smoking status:  Current Every Day Smoker -- 1.00 packs/day for 53 years    Types: Cigarettes  . Smokeless tobacco: Never Used     Comment: currently smoking 3/4 ppd.   . Alcohol Use: Yes     Comment: rarely   OB History   Grav Para Term Preterm Abortions TAB SAB Ect Mult Living                 Review of Systems  Constitutional: Negative for fever.  Respiratory: Positive for chest tightness (chronic, unchanged) and shortness of breath (chronic, unchanged).   Skin: Positive for rash. Negative for color change.  Hematological: Bruises/bleeds easily.  All other systems reviewed and are negative.     Allergies  Ciprofloxacin; Uloric; Vicodin; Zolpidem tartrate; and Tetracycline  Home Medications   Current Outpatient Rx  Name  Route  Sig  Dispense  Refill  . albuterol (PROVENTIL HFA;VENTOLIN HFA) 108 (90 BASE) MCG/ACT inhaler   Inhalation   Inhale 2 puffs into the lungs every 4 (four) hours as needed.   1 Inhaler   3     Please make sure there is an actuation counter on  ...   . allopurinol (ZYLOPRIM) 100 MG tablet   Oral   Take 100 mg by mouth 2 (two) times daily.           Marland Kitchen  arformoterol (BROVANA) 15 MCG/2ML NEBU   Nebulization   Take 2 mLs (15 mcg total) by nebulization 2 (two) times daily. DX  496   360 mL   3   . aspirin 81 MG tablet   Oral   Take 81 mg by mouth daily.           . budesonide (PULMICORT) 0.5 MG/2ML nebulizer solution      INHALE THE CONTENTS OF 1   RESPULE BY NEBULIZATION TWOTIMES A DAY   360 mL   3   . Casanthranol-Docusate Sodium 30-100 MG CAPS   Oral   Take 2-3 capsules by mouth daily as needed. For constipation         . clopidogrel (PLAVIX) 75 MG tablet   Oral   Take 1 tablet (75 mg total) by mouth daily.   90 tablet   3   . diltiazem (DILACOR XR) 240 MG 24 hr capsule   Oral   Take 1 capsule (240 mg total) by mouth daily.   90 capsule   3   . famotidine (PEPCID) 20 MG tablet   Oral   Take 20 mg by mouth as needed for heartburn.           . furosemide (LASIX) 40 MG tablet   Oral   Take 40 mg by mouth daily as needed. For weight gain         . IRON PO   Oral   Take 90 mg by mouth daily.          . nebivolol (BYSTOLIC) 5 MG tablet      TAKE ONE TABLET BY MOUTH ONE TIME DAILY   90 tablet   3   . nitroGLYCERIN (NITROSTAT) 0.4 MG SL tablet   Sublingual   Place 1 tablet (0.4 mg total) under the tongue every 5 (five) minutes as needed.   25 tablet   3   . polyethylene glycol (MIRALAX / GLYCOLAX) packet   Oral   Take 17 g by mouth daily as needed. For constipation         . rosuvastatin (CRESTOR) 20 MG tablet   Oral   Take 1 tablet (20 mg total) by mouth daily.   90 tablet   3   . tiotropium (SPIRIVA HANDIHALER) 18 MCG inhalation capsule   Inhalation   Place 1 capsule (18 mcg total) into inhaler and inhale daily.   90 capsule   3   . venlafaxine XR (EFFEXOR-XR) 75 MG 24 hr capsule   Oral   Take 75 mg by mouth 2 (two) times daily.         . vitamin B-12 (CYANOCOBALAMIN) 1000 MCG tablet   Oral   Take 1,000 mcg by mouth daily.          BP 118/70  Pulse 75  Temp(Src) 97.5 F (36.4 C) (Oral)  Resp 24  SpO2 93% Physical Exam  Nursing note and vitals reviewed. Constitutional: She appears well-developed and well-nourished. No distress.  HENT:  Head: Normocephalic and atraumatic.  Neck: Neck supple.  Pulmonary/Chest: Effort normal.  Neurological: She is alert.  Skin: She is not diaphoretic.       ED Course  Procedures (including critical care time) Labs Review Labs Reviewed - No data to display Imaging Review No results found.   EKG Interpretation None      7:04 AM Quick clot gauze and tegaderm used.   Wound appears to be hemostatic after approximately 20 minutes.  MDM   Final diagnoses:  Bleeding from wound  Anticoagulated    Pt on plavix and aspirin with dermatology procedure (nevus removal) two days ago that continues to ooze small amount of bright red blood  from small area of shave biopsy.  Quick clot gauze cut to fit wound and tegaderm placed over the area to allow monitoring for further bleeding.  Pt observed and no bleeding occurred through the guaze.  Pt instructed on home wound care and reasons for return.  D/C home, follow up with dermatologist.  Discussed findings, treatment, and follow up  with patient.  Pt given return precautions.  Pt verbalizes understanding and agrees with plan.        [place], PA-C 09/13/13 [phone]

## 2013-09-13 NOTE — Discharge Instructions (Signed)
Read the information below.  You may return to the Emergency Department at any time for worsening condition or any new symptoms that concern you.  Leave the current bandage in place for at least 24 hours.  Then please follow the instructions of your dermatologist for care of your wound until it heals.  If the bleeding continues or you begin to feel weak or faint, return to the emergency department for a recheck.  If you develop redness, swelling, pus draining from the wound, or fevers greater than 100.4, return to the ER immediately for a recheck.

## 2013-09-21 NOTE — ED Provider Notes (Signed)
Shared service with midlevel provider. I have personally seen and examined the patient, providing direct face to face care, presenting with the chief complaint of bleeding. Physical exam findings include mild bleeding from her mole site. Plan will be to pus some adhesives, control the bleed and discharge the patient when apropriate. I have reviewed the nursing documentation on past medical history, family history, and social history.   Varney Biles, MD 09/21/13 1540

## 2013-10-03 ENCOUNTER — Emergency Department (HOSPITAL_COMMUNITY): Payer: Medicare (Managed Care)

## 2013-10-03 ENCOUNTER — Inpatient Hospital Stay (HOSPITAL_COMMUNITY)
Admission: EM | Admit: 2013-10-03 | Discharge: 2013-10-05 | DRG: 378 | Disposition: A | Discharge status: Home Health | Payer: Medicare (Managed Care) | Attending: Internal Medicine | Admitting: Internal Medicine

## 2013-10-03 ENCOUNTER — Encounter (HOSPITAL_COMMUNITY): Payer: Self-pay | Admitting: Emergency Medicine

## 2013-10-03 DIAGNOSIS — R531 Weakness: Secondary | ICD-10-CM

## 2013-10-03 DIAGNOSIS — I739 Peripheral vascular disease, unspecified: Secondary | ICD-10-CM | POA: Diagnosis present

## 2013-10-03 DIAGNOSIS — R000 Tachycardia, unspecified: Secondary | ICD-10-CM

## 2013-10-03 DIAGNOSIS — D62 Acute posthemorrhagic anemia: Secondary | ICD-10-CM | POA: Diagnosis present

## 2013-10-03 DIAGNOSIS — Z7902 Long term (current) use of antithrombotics/antiplatelets: Secondary | ICD-10-CM

## 2013-10-03 DIAGNOSIS — K219 Gastro-esophageal reflux disease without esophagitis: Secondary | ICD-10-CM | POA: Diagnosis present

## 2013-10-03 DIAGNOSIS — R5381 Other malaise: Secondary | ICD-10-CM

## 2013-10-03 DIAGNOSIS — J9610 Chronic respiratory failure, unspecified whether with hypoxia or hypercapnia: Secondary | ICD-10-CM

## 2013-10-03 DIAGNOSIS — J449 Chronic obstructive pulmonary disease, unspecified: Secondary | ICD-10-CM

## 2013-10-03 DIAGNOSIS — Z79899 Other long term (current) drug therapy: Secondary | ICD-10-CM

## 2013-10-03 DIAGNOSIS — Z8542 Personal history of malignant neoplasm of other parts of uterus: Secondary | ICD-10-CM

## 2013-10-03 DIAGNOSIS — J438 Other emphysema: Secondary | ICD-10-CM | POA: Diagnosis present

## 2013-10-03 DIAGNOSIS — I700 Atherosclerosis of aorta: Secondary | ICD-10-CM

## 2013-10-03 DIAGNOSIS — E871 Hypo-osmolality and hyponatremia: Secondary | ICD-10-CM

## 2013-10-03 DIAGNOSIS — K5710 Diverticulosis of small intestine without perforation or abscess without bleeding: Secondary | ICD-10-CM | POA: Diagnosis present

## 2013-10-03 DIAGNOSIS — J9620 Acute and chronic respiratory failure, unspecified whether with hypoxia or hypercapnia: Secondary | ICD-10-CM

## 2013-10-03 DIAGNOSIS — J189 Pneumonia, unspecified organism: Secondary | ICD-10-CM

## 2013-10-03 DIAGNOSIS — I252 Old myocardial infarction: Secondary | ICD-10-CM

## 2013-10-03 DIAGNOSIS — F17200 Nicotine dependence, unspecified, uncomplicated: Secondary | ICD-10-CM | POA: Diagnosis present

## 2013-10-03 DIAGNOSIS — F419 Anxiety disorder, unspecified: Secondary | ICD-10-CM

## 2013-10-03 DIAGNOSIS — D72829 Elevated white blood cell count, unspecified: Secondary | ICD-10-CM

## 2013-10-03 DIAGNOSIS — Z9981 Dependence on supplemental oxygen: Secondary | ICD-10-CM

## 2013-10-03 DIAGNOSIS — C4490 Unspecified malignant neoplasm of skin, unspecified: Secondary | ICD-10-CM | POA: Diagnosis present

## 2013-10-03 DIAGNOSIS — N289 Disorder of kidney and ureter, unspecified: Secondary | ICD-10-CM

## 2013-10-03 DIAGNOSIS — K922 Gastrointestinal hemorrhage, unspecified: Secondary | ICD-10-CM | POA: Diagnosis present

## 2013-10-03 DIAGNOSIS — Z8249 Family history of ischemic heart disease and other diseases of the circulatory system: Secondary | ICD-10-CM

## 2013-10-03 DIAGNOSIS — I10 Essential (primary) hypertension: Secondary | ICD-10-CM

## 2013-10-03 DIAGNOSIS — I4510 Unspecified right bundle-branch block: Secondary | ICD-10-CM

## 2013-10-03 DIAGNOSIS — K921 Melena: Principal | ICD-10-CM | POA: Diagnosis present

## 2013-10-03 DIAGNOSIS — I509 Heart failure, unspecified: Secondary | ICD-10-CM | POA: Diagnosis present

## 2013-10-03 DIAGNOSIS — N184 Chronic kidney disease, stage 4 (severe): Secondary | ICD-10-CM | POA: Diagnosis present

## 2013-10-03 DIAGNOSIS — I2510 Atherosclerotic heart disease of native coronary artery without angina pectoris: Secondary | ICD-10-CM | POA: Diagnosis present

## 2013-10-03 DIAGNOSIS — D649 Anemia, unspecified: Secondary | ICD-10-CM

## 2013-10-03 DIAGNOSIS — N189 Chronic kidney disease, unspecified: Secondary | ICD-10-CM

## 2013-10-03 DIAGNOSIS — I129 Hypertensive chronic kidney disease with stage 1 through stage 4 chronic kidney disease, or unspecified chronic kidney disease: Secondary | ICD-10-CM | POA: Diagnosis present

## 2013-10-03 DIAGNOSIS — I5032 Chronic diastolic (congestive) heart failure: Secondary | ICD-10-CM | POA: Diagnosis present

## 2013-10-03 DIAGNOSIS — Z7982 Long term (current) use of aspirin: Secondary | ICD-10-CM

## 2013-10-03 DIAGNOSIS — E785 Hyperlipidemia, unspecified: Secondary | ICD-10-CM

## 2013-10-03 DIAGNOSIS — N179 Acute kidney failure, unspecified: Secondary | ICD-10-CM

## 2013-10-03 DIAGNOSIS — Z720 Tobacco use: Secondary | ICD-10-CM

## 2013-10-03 DIAGNOSIS — R5383 Other fatigue: Secondary | ICD-10-CM

## 2013-10-03 HISTORY — DX: Headache, unspecified: R51.9

## 2013-10-03 HISTORY — DX: Gastrointestinal hemorrhage, unspecified: K92.2

## 2013-10-03 HISTORY — DX: Major depressive disorder, single episode, unspecified: F32.9

## 2013-10-03 HISTORY — DX: Depression, unspecified: F32.A

## 2013-10-03 HISTORY — DX: Other giant cell arteritis: M31.6

## 2013-10-03 HISTORY — DX: Anemia, unspecified: D64.9

## 2013-10-03 HISTORY — DX: Low back pain: M54.5

## 2013-10-03 HISTORY — DX: Unspecified osteoarthritis, unspecified site: M19.90

## 2013-10-03 HISTORY — DX: Other chronic pain: G89.29

## 2013-10-03 HISTORY — DX: Chronic kidney disease, stage 4 (severe): N18.4

## 2013-10-03 HISTORY — DX: Low back pain, unspecified: M54.50

## 2013-10-03 HISTORY — DX: Headache: R51

## 2013-10-03 HISTORY — DX: Personal history of other medical treatment: Z92.89

## 2013-10-03 HISTORY — DX: Malignant neoplasm of uterus, part unspecified: C55

## 2013-10-03 LAB — BASIC METABOLIC PANEL
BUN: 46 mg/dL — ABNORMAL HIGH (ref 6–23)
CHLORIDE: 97 meq/L (ref 96–112)
CO2: 30 meq/L (ref 19–32)
CREATININE: 1.75 mg/dL — AB (ref 0.50–1.10)
Calcium: 9 mg/dL (ref 8.4–10.5)
GFR calc non Af Amer: 28 mL/min — ABNORMAL LOW (ref >90)
GFR, EST AFRICAN AMERICAN: 32 mL/min — AB (ref >90)
Glucose, Bld: 80 mg/dL (ref 70–99)
POTASSIUM: 3.9 meq/L (ref 3.7–5.3)
SODIUM: 140 meq/L (ref 137–147)

## 2013-10-03 LAB — CBC
HCT: 28.6 % — ABNORMAL LOW (ref 36.0–46.0)
Hemoglobin: 9.1 g/dL — ABNORMAL LOW (ref 12.0–15.0)
MCH: 31.8 pg (ref 26.0–34.0)
MCHC: 31.8 g/dL (ref 30.0–36.0)
MCV: 100 fL (ref 78.0–100.0)
PLATELETS: 147 10*3/uL — AB (ref 150–400)
RBC: 2.86 MIL/uL — AB (ref 3.87–5.11)
RDW: 15.7 % — AB (ref 11.5–15.5)
WBC: 12.5 10*3/uL — AB (ref 4.0–10.5)

## 2013-10-03 LAB — I-STAT TROPONIN, ED: TROPONIN I, POC: 0.06 ng/mL (ref 0.00–0.08)

## 2013-10-03 LAB — TSH: TSH: 3.47 u[IU]/mL (ref 0.350–4.500)

## 2013-10-03 LAB — TYPE AND SCREEN
ABO/RH(D): A NEG
Antibody Screen: NEGATIVE

## 2013-10-03 LAB — PRO B NATRIURETIC PEPTIDE: PRO B NATRI PEPTIDE: 3256 pg/mL — AB (ref 0–125)

## 2013-10-03 LAB — POC OCCULT BLOOD, ED: FECAL OCCULT BLD: POSITIVE — AB

## 2013-10-03 MED ORDER — ONDANSETRON HCL 4 MG PO TABS: 4.0000 mg | ORAL_TABLET | Freq: Four times a day (QID) | ORAL | Status: DC | PRN

## 2013-10-03 MED ORDER — POLYETHYLENE GLYCOL 3350 17 G PO PACK
17.0000 g | PACK | Freq: Every day | ORAL | Status: DC | PRN
  Administered 2013-10-05: 17 g via ORAL
  Filled 2013-10-03 (×2): qty 1

## 2013-10-03 MED ORDER — NITROGLYCERIN 0.4 MG SL SUBL: 0.4000 mg | SUBLINGUAL_TABLET | SUBLINGUAL | Status: DC | PRN

## 2013-10-03 MED ORDER — TIOTROPIUM BROMIDE MONOHYDRATE 18 MCG IN CAPS
18.0000 ug | ORAL_CAPSULE | Freq: Every day | RESPIRATORY_TRACT | Status: DC
  Administered 2013-10-05: 18 ug via RESPIRATORY_TRACT
  Filled 2013-10-03: qty 5

## 2013-10-03 MED ORDER — NEBIVOLOL HCL 5 MG PO TABS
5.0000 mg | ORAL_TABLET | Freq: Every day | ORAL | Status: DC
  Administered 2013-10-03 – 2013-10-05 (×2): 5 mg via ORAL
  Filled 2013-10-03 (×3): qty 1

## 2013-10-03 MED ORDER — PREDNISONE 20 MG PO TABS
40.0000 mg | ORAL_TABLET | Freq: Every day | ORAL | Status: DC
  Administered 2013-10-05: 40 mg via ORAL
  Filled 2013-10-03 (×4): qty 2

## 2013-10-03 MED ORDER — ATORVASTATIN CALCIUM 40 MG PO TABS
40.0000 mg | ORAL_TABLET | Freq: Every day | ORAL | Status: DC
  Administered 2013-10-03 – 2013-10-04 (×2): 40 mg via ORAL
  Filled 2013-10-03 (×4): qty 1

## 2013-10-03 MED ORDER — PANTOPRAZOLE SODIUM 40 MG IV SOLR
40.0000 mg | Freq: Two times a day (BID) | INTRAVENOUS | Status: DC
  Administered 2013-10-03: 40 mg via INTRAVENOUS
  Filled 2013-10-03 (×4): qty 40

## 2013-10-03 MED ORDER — SODIUM CHLORIDE 0.9 % IV SOLN: INTRAVENOUS | Status: DC

## 2013-10-03 MED ORDER — ALBUTEROL SULFATE HFA 108 (90 BASE) MCG/ACT IN AERS: 2.0000 | INHALATION_SPRAY | RESPIRATORY_TRACT | Status: DC | PRN

## 2013-10-03 MED ORDER — ARFORMOTEROL TARTRATE 15 MCG/2ML IN NEBU
15.0000 ug | INHALATION_SOLUTION | Freq: Every day | RESPIRATORY_TRACT | Status: DC
  Administered 2013-10-05: 15 ug via RESPIRATORY_TRACT
  Filled 2013-10-03 (×3): qty 2

## 2013-10-03 MED ORDER — ONDANSETRON HCL 4 MG/2ML IJ SOLN: 4.0000 mg | Freq: Four times a day (QID) | INTRAMUSCULAR | Status: DC | PRN

## 2013-10-03 MED ORDER — SODIUM CHLORIDE 0.9 % IV SOLN: 250.0000 mL | INTRAVENOUS | Status: DC | PRN

## 2013-10-03 MED ORDER — BUDESONIDE 0.5 MG/2ML IN SUSP
0.5000 mg | Freq: Every day | RESPIRATORY_TRACT | Status: DC
  Administered 2013-10-05: 0.5 mg via RESPIRATORY_TRACT
  Filled 2013-10-03 (×3): qty 2

## 2013-10-03 MED ORDER — FAMOTIDINE 20 MG PO TABS
20.0000 mg | ORAL_TABLET | Freq: Two times a day (BID) | ORAL | Status: DC | PRN
  Filled 2013-10-03: qty 1

## 2013-10-03 MED ORDER — VENLAFAXINE HCL ER 75 MG PO CP24
75.0000 mg | ORAL_CAPSULE | Freq: Every day | ORAL | Status: DC
  Administered 2013-10-03 – 2013-10-05 (×2): 75 mg via ORAL
  Filled 2013-10-03 (×4): qty 1

## 2013-10-03 MED ORDER — SODIUM CHLORIDE 0.9 % IJ SOLN
3.0000 mL | Freq: Two times a day (BID) | INTRAMUSCULAR | Status: DC
  Administered 2013-10-03: 3 mL via INTRAVENOUS

## 2013-10-03 MED ORDER — DILTIAZEM HCL ER 240 MG PO CP24
240.0000 mg | ORAL_CAPSULE | Freq: Every day | ORAL | Status: DC
  Administered 2013-10-03 – 2013-10-05 (×2): 240 mg via ORAL
  Filled 2013-10-03 (×4): qty 1

## 2013-10-03 MED ORDER — ALLOPURINOL 100 MG PO TABS
100.0000 mg | ORAL_TABLET | Freq: Two times a day (BID) | ORAL | Status: DC
  Administered 2013-10-03 – 2013-10-05 (×3): 100 mg via ORAL
  Filled 2013-10-03 (×6): qty 1

## 2013-10-03 MED ORDER — SODIUM CHLORIDE 0.9 % IJ SOLN: 3.0000 mL | INTRAMUSCULAR | Status: DC | PRN

## 2013-10-03 MED ORDER — ALBUTEROL SULFATE (2.5 MG/3ML) 0.083% IN NEBU
2.5000 mg | INHALATION_SOLUTION | RESPIRATORY_TRACT | Status: DC | PRN
  Administered 2013-10-03: 2.5 mg via RESPIRATORY_TRACT
  Filled 2013-10-03: qty 3

## 2013-10-03 MED ORDER — FUROSEMIDE 40 MG PO TABS
40.0000 mg | ORAL_TABLET | Freq: Every day | ORAL | Status: DC
  Administered 2013-10-03 – 2013-10-05 (×2): 40 mg via ORAL
  Filled 2013-10-03 (×4): qty 1

## 2013-10-03 NOTE — ED Provider Notes (Signed)
CSN: [id number]     Arrival date & time 10/03/13  1133 History   First MD Initiated Contact with Patient 10/03/13 1250     Chief Complaint  Patient presents with  . Fatigue  . Palpitations     (Consider location/radiation/quality/duration/timing/severity/associated sxs/prior Treatment) Patient is a 73 y.o. female presenting with palpitations.  Palpitations  Pt with multiple medical problems reports about 2 weeks of general fatigue, occasional palpitations. SOB at baseline but worse with walking around. Denies any CP, no abd pain, N/V/D. She reports dark colored stools she attributed to taking iron, although she also admits she has been off iron for a few days and stools remain brown. She has also been taking high dose Prednisone for temporal arteritis, initial on 60mg  but recently tapered to 40mg  daily.  Past Medical History  Diagnosis Date  . CAD (coronary artery disease)   . Atherosclerosis of abdominal aorta   . On home oxygen therapy   . Tobacco abuse   . HTN (hypertension)   . COPD (chronic obstructive pulmonary disease)   . PVD (peripheral vascular disease)   . Anxiety   . GERD (gastroesophageal reflux disease)   . RBBB (right bundle branch block)   . Tachycardia   . High cholesterol   . Renal insufficiency   . Leukocytosis   . Endometrial carcinoma   . CHF (congestive heart failure)   . Myocardial infarction     " MILD "  . Shortness of breath   . UTI (urinary tract infection)    Past Surgical History  Procedure Laterality Date  . Coronary angioplasty    . Abdominal hysterectomy    . Carotid stent    . Tonsillectomy    . Arm surgery      PLATES AND SCREWS  . Breast reduction surgery    . Femoral artery stent     Family History  Problem Relation Age of Onset  . Heart attack Father   . Coronary artery disease Father   . Leukemia Mother   . Leukemia Brother    History  Substance Use Topics  . Smoking status: Current Every Day Smoker -- 1.00 packs/day for  53 years    Types: Cigarettes  . Smokeless tobacco: Never Used     Comment: currently smoking 3/4 ppd.   . Alcohol Use: Yes     Comment: rarely   OB History   Grav Para Term Preterm Abortions TAB SAB Ect Mult Living                 Review of Systems  Cardiovascular: Positive for palpitations.   All other systems reviewed and are negative except as noted in HPI.     Allergies  Uloric; Zolpidem tartrate; Ciprofloxacin; Contrast media; Tetracycline; and Vicodin  Home Medications   Prior to Admission medications   Medication Sig Start Date End Date Taking? Authorizing Provider  albuterol (PROVENTIL HFA;VENTOLIN HFA) 108 (90 BASE) MCG/ACT inhaler Inhale 2 puffs into the lungs every 4 (four) hours as needed for wheezing or shortness of breath.   Yes Historical Provider, MD  allopurinol (ZYLOPRIM) 100 MG tablet Take 100 mg by mouth 2 (two) times daily.     Yes Historical Provider, MD  arformoterol (BROVANA) 15 MCG/2ML NEBU Take 15 mcg by nebulization daily.   Yes Historical Provider, MD  aspirin 81 MG tablet Take 81 mg by mouth daily.     Yes Historical Provider, MD  budesonide (PULMICORT) 0.5 MG/2ML nebulizer solution Take 0.5 mg  by nebulization daily.   Yes Historical Provider, MD  clopidogrel (PLAVIX) 75 MG tablet Take 1 tablet (75 mg total) by mouth daily. 07/15/13  Yes Thayer Headings, MD  diltiazem (DILACOR XR) 240 MG 24 hr capsule Take 1 capsule (240 mg total) by mouth daily. 07/15/13  Yes Thayer Headings, MD  famotidine (PEPCID) 20 MG tablet Take 20 mg by mouth 2 (two) times daily as needed for heartburn.    Yes Historical Provider, MD  furosemide (LASIX) 40 MG tablet Take 40 mg by mouth daily. For weight gain 12/15/11  Yes Historical Provider, MD  nebivolol (BYSTOLIC) 5 MG tablet Take 5 mg by mouth daily.   Yes Historical Provider, MD  polyethylene glycol (MIRALAX / GLYCOLAX) packet Take 17 g by mouth 2 (two) times daily as needed for mild constipation. For constipation   Yes  Historical Provider, MD  predniSONE (DELTASONE) 20 MG tablet Take 40 mg by mouth daily with breakfast.    Yes Historical Provider, MD  rosuvastatin (CRESTOR) 20 MG tablet Take 1 tablet (20 mg total) by mouth daily. 07/15/13  Yes Thayer Headings, MD  tiotropium (SPIRIVA HANDIHALER) 18 MCG inhalation capsule Place 1 capsule (18 mcg total) into inhaler and inhale daily. 08/01/12  Yes Kathee Delton, MD  venlafaxine XR (EFFEXOR-XR) 75 MG 24 hr capsule Take 75 mg by mouth daily.    Yes Historical Provider, MD  vitamin B-12 (CYANOCOBALAMIN) 1000 MCG tablet Take 1,000 mcg by mouth daily.   Yes Historical Provider, MD  nitroGLYCERIN (NITROSTAT) 0.4 MG SL tablet Place 0.4 mg under the tongue every 5 (five) minutes as needed for chest pain.    Historical Provider, MD   BP 138/77  Pulse 67  Temp(Src) 98.4 F (36.9 C) (Oral)  Resp 23  Ht 5\' 8"  (1.727 m)  Wt 179 lb (81.194 kg)  BMI 27.22 kg/m2  SpO2 93% Physical Exam  Nursing note and vitals reviewed. Constitutional: She is oriented to person, place, and time. She appears well-developed and well-nourished.  HENT:  Head: Normocephalic and atraumatic.  Eyes: EOM are normal. Pupils are equal, round, and reactive to light.  Neck: Normal range of motion. Neck supple.  Cardiovascular: Normal rate, normal heart sounds and intact distal pulses.   Pulmonary/Chest: Effort normal and breath sounds normal. She has no wheezes. She has no rales.  Abdominal: Bowel sounds are normal. She exhibits no distension. There is no tenderness.  Genitourinary: Guaiac positive stool.  Stool dark brown  Musculoskeletal: Normal range of motion. She exhibits edema (1+ edema BLE). She exhibits no tenderness.  Neurological: She is alert and oriented to person, place, and time. She has normal strength. No cranial nerve deficit or sensory deficit.  Skin: Skin is warm and dry. No rash noted.  Psychiatric: She has a normal mood and affect.    ED Course  Procedures (including  critical care time) Labs Review Labs Reviewed  CBC - Abnormal; Notable for the following:    WBC 12.5 (*)    RBC 2.86 (*)    Hemoglobin 9.1 (*)    HCT 28.6 (*)    RDW 15.7 (*)    Platelets 147 (*)    All other components within normal limits  BASIC METABOLIC PANEL - Abnormal; Notable for the following:    BUN 46 (*)    Creatinine, Ser 1.75 (*)    GFR calc non Af Amer 28 (*)    GFR calc Af Amer 32 (*)    All other  components within normal limits  PRO B NATRIURETIC PEPTIDE - Abnormal; Notable for the following:    Pro B Natriuretic peptide (BNP) 3256.0 (*)    All other components within normal limits  POC OCCULT BLOOD, ED - Abnormal; Notable for the following:    Fecal Occult Bld POSITIVE (*)    All other components within normal limits  I-STAT TROPOININ, ED  CBG MONITORING, ED  TYPE AND SCREEN    Imaging Review Dg Chest 2 View  10/03/2013   CLINICAL DATA:  Heart palpitations.  EXAM: CHEST  2 VIEW  COMPARISON:  Chest x-ray 11/02/2012.  FINDINGS: Mediastinum and hilar structures are normal. Bibasilar atelectasis and/or scarring again noted. Stable pleural thickening. No focal alveolar infiltrate. Heart size stable. No pulmonary venous congestion. COPD. No acute bony abnormality. Degenerative changes thoracic spine.  IMPRESSION: Stable chest with changes of basilar pleural-parenchymal scarring and atelectasis. No acute abnormality.   Electronically Signed   By: Marcello Moores  Register   On: 10/03/2013 12:58    EKG not in MUSE   Date: 10/03/2013  Rate: 83  Rhythm: normal sinus rhythm  QRS Axis: right  Intervals: normal  ST/T Wave abnormalities: nonspecific T wave changes  Conduction Disutrbances:right bundle branch block  Narrative Interpretation:   Old EKG Reviewed: unchanged    MDM   Final diagnoses:  Weakness  GI bleed    Pt with general weakness and heme positive stools. She is on Plavix. Will admit for further eval.    Chauna Osoria B. Karle Starch, MD 10/03/13 1728

## 2013-10-03 NOTE — H&P (Signed)
[facility] History and Physical  Wanda Yang GDJ:[id number] DOB: 1941-03-04 DOA: 10/03/2013  Referring physician: Dr Karle Starch.  PCP: Lynne Logan, MD   Chief Complaint: Weakness.   HPI: Wanda Yang is a 73 y.o. female with PMH significant for CAD, HTN, CKD stage IV, COPD on 3 L oxygen at home, Diastolic heart Failure, who presents complaining of generalized weakness, fatigue for last week. She feels tire, low energy. She denies worsening dyspnea. No chest pain. She relates black stool, last episode last week. She also notice at that time bright red blood in the stool. She stop taking iron but stool continue to be black. Her hb on admission was at 9, she was found to have positive occult blood. She also relates constipation.   She was recently diagnose with skin cancer, skin lesion on her back.    Review of Systems:  Negative, except as per HPI.   Past Medical History  Diagnosis Date  . CAD (coronary artery disease)   . Atherosclerosis of abdominal aorta   . On home oxygen therapy   . Tobacco abuse   . HTN (hypertension)   . COPD (chronic obstructive pulmonary disease)   . PVD (peripheral vascular disease)   . Anxiety   . GERD (gastroesophageal reflux disease)   . RBBB (right bundle branch block)   . Tachycardia   . High cholesterol   . Renal insufficiency   . Leukocytosis   . Endometrial carcinoma   . CHF (congestive heart failure)   . Myocardial infarction     " MILD "  . Shortness of breath   . UTI (urinary tract infection)    Past Surgical History  Procedure Laterality Date  . Coronary angioplasty    . Abdominal hysterectomy    . Carotid stent    . Tonsillectomy    . Arm surgery      PLATES AND SCREWS  . Breast reduction surgery    . Femoral artery stent     Social History:  reports that she has been smoking Cigarettes.  She has a 53 pack-year smoking history. She has never used smokeless tobacco. She reports that she drinks alcohol. She  reports that she does not use illicit drugs.  Allergies  Allergen Reactions  . Uloric [Febuxostat] Other (See Comments)    Unknown reaction  . Zolpidem Tartrate Other (See Comments)    hallucinations  . Ciprofloxacin Anxiety and Other (See Comments)    Shakiness  . Contrast Media [Iodinated Diagnostic Agents] Rash  . Tetracycline Rash  . Vicodin [Hydrocodone-Acetaminophen] Anxiety    Family History  Problem Relation Age of Onset  . Heart attack Father   . Coronary artery disease Father   . Leukemia Mother   . Leukemia Brother      Prior to Admission medications   Medication Sig Start Date End Date Taking? Authorizing Provider  albuterol (PROVENTIL HFA;VENTOLIN HFA) 108 (90 BASE) MCG/ACT inhaler Inhale 2 puffs into the lungs every 4 (four) hours as needed for wheezing or shortness of breath.   Yes Historical Provider, MD  allopurinol (ZYLOPRIM) 100 MG tablet Take 100 mg by mouth 2 (two) times daily.     Yes Historical Provider, MD  arformoterol (BROVANA) 15 MCG/2ML NEBU Take 15 mcg by nebulization daily.   Yes Historical Provider, MD  aspirin 81 MG tablet Take 81 mg by mouth daily.     Yes Historical Provider, MD  budesonide (PULMICORT) 0.5 MG/2ML nebulizer solution Take 0.5 mg by nebulization daily.  Yes Historical Provider, MD  clopidogrel (PLAVIX) 75 MG tablet Take 1 tablet (75 mg total) by mouth daily. 07/15/13  Yes Thayer Headings, MD  diltiazem (DILACOR XR) 240 MG 24 hr capsule Take 1 capsule (240 mg total) by mouth daily. 07/15/13  Yes Thayer Headings, MD  famotidine (PEPCID) 20 MG tablet Take 20 mg by mouth 2 (two) times daily as needed for heartburn.    Yes Historical Provider, MD  furosemide (LASIX) 40 MG tablet Take 40 mg by mouth daily. For weight gain 12/15/11  Yes Historical Provider, MD  nebivolol (BYSTOLIC) 5 MG tablet Take 5 mg by mouth daily.   Yes Historical Provider, MD  polyethylene glycol (MIRALAX / GLYCOLAX) packet Take 17 g by mouth 2 (two) times daily as needed  for mild constipation. For constipation   Yes Historical Provider, MD  predniSONE (DELTASONE) 20 MG tablet Take 40 mg by mouth daily with breakfast.    Yes Historical Provider, MD  rosuvastatin (CRESTOR) 20 MG tablet Take 1 tablet (20 mg total) by mouth daily. 07/15/13  Yes Thayer Headings, MD  tiotropium (SPIRIVA HANDIHALER) 18 MCG inhalation capsule Place 1 capsule (18 mcg total) into inhaler and inhale daily. 08/01/12  Yes Kathee Delton, MD  venlafaxine XR (EFFEXOR-XR) 75 MG 24 hr capsule Take 75 mg by mouth daily.    Yes Historical Provider, MD  vitamin B-12 (CYANOCOBALAMIN) 1000 MCG tablet Take 1,000 mcg by mouth daily.   Yes Historical Provider, MD  nitroGLYCERIN (NITROSTAT) 0.4 MG SL tablet Place 0.4 mg under the tongue every 5 (five) minutes as needed for chest pain.    Historical Provider, MD   Physical Exam: Filed Vitals:   10/03/13 1608  BP: 129/77  Pulse: 81  Temp: 98.5 F (36.9 C)  Resp: 20    BP 129/77  Pulse 81  Temp(Src) 98.5 F (36.9 C) (Oral)  Resp 20  Ht 5\' 8"  (1.727 m)  Wt 76.1 kg (167 lb 12.3 oz)  BMI 25.52 kg/m2  SpO2 97%  General:  Appears calm and comfortable Eyes: PERRL, normal lids, irises & conjunctiva ENT: grossly normal hearing, lips & tongue Neck: no LAD, masses or thyromegaly Cardiovascular: RRR, no m/r/g. No LE edema. Telemetry: SR, no arrhythmias  Respiratory: CTA bilaterally, no w/r/r. Normal respiratory effort. Abdomen: soft, ntnd Skin: no rash or induration seen on limited exam Musculoskeletal: grossly normal tone BUE/BLE Psychiatric: grossly normal mood and affect, speech fluent and appropriate Neurologic: grossly non-focal.          Labs on Admission:  Basic Metabolic Panel:  Recent Labs Lab 10/03/13 1143  NA 140  K 3.9  CL 97  CO2 30  GLUCOSE 80  BUN 46*  CREATININE 1.75*  CALCIUM 9.0   Liver Function Tests: No results found for this basename: AST, ALT, ALKPHOS, BILITOT, PROT, ALBUMIN,  in the last 168 hours No results  found for this basename: LIPASE, AMYLASE,  in the last 168 hours No results found for this basename: AMMONIA,  in the last 168 hours CBC:  Recent Labs Lab 10/03/13 1143  WBC 12.5*  HGB 9.1*  HCT 28.6*  MCV 100.0  PLT 147*   Cardiac Enzymes: No results found for this basename: CKTOTAL, CKMB, CKMBINDEX, TROPONINI,  in the last 168 hours  BNP (last 3 results)  Recent Labs  11/01/12 1149 10/03/13 1143  PROBNP 7067.0* 3256.0*   CBG: No results found for this basename: GLUCAP,  in the last 168 hours  Radiological Exams  on Admission: Dg Chest 2 View  10/03/2013   CLINICAL DATA:  Heart palpitations.  EXAM: CHEST  2 VIEW  COMPARISON:  Chest x-ray 11/02/2012.  FINDINGS: Mediastinum and hilar structures are normal. Bibasilar atelectasis and/or scarring again noted. Stable pleural thickening. No focal alveolar infiltrate. Heart size stable. No pulmonary venous congestion. COPD. No acute bony abnormality. Degenerative changes thoracic spine.  IMPRESSION: Stable chest with changes of basilar pleural-parenchymal scarring and atelectasis. No acute abnormality.   Electronically Signed   By: Marcello Moores  Register   On: 10/03/2013 12:58    EKG: ordered.   Assessment/Plan Active Problems:   Chronic respiratory failure   COPD (chronic obstructive pulmonary disease)   GI bleed   Weakness  1-Generalized weakness, fatigue; could be related to anemia, although last hb in our records Hb was at 8. Will also check TSH, repeat Hb in am. PT evaluation. Will also check ECHO.   2-GI bleed; patient presents with melena, relates constipation, has anemia, occult blood positive. Will start clear diet. Start IV protonix. Dr Benson Norway will see patient in consultation. Will hold plavix and aspirin.   3-COPD, emphysema; continue with nebulizer treatments. On 3 L oxygen at home.   4-Diastolic Heart failure; Appears to be compensated. Continue with lasix. BNP lower than before.   5-CKD stage IV; cr better than before.  Last cr per our records was at 3. 0. Cr on admission at 1.7. Monitor on lasix.  6-CAD; hold plavix and aspirin in setting of acute bleed,.   Code Status: presume full code.  Family Communication: Care discussed with patient and husband.  Disposition Plan: expect 2 to 3 days inpatient.   Time spent: 75 minutes.   [facility] Pager [phone]

## 2013-10-03 NOTE — ED Notes (Signed)
Dr Sheldon at bedside  

## 2013-10-03 NOTE — Consult Note (Signed)
Unassigned Consult  Reason for Consult: Melena, Anemia, and Heme positive stool Referring Physician: [facility]  Wanda Yang HPI: This is a 73 year old female with a PMH of HTN, CAD, renal failure, and COPD who presents with progressive weakness and fatigue.  Her symptoms stated one week ago and she also reported having issues with melena.  No complaints of chest pain, but she does have a worsening of her SOB.  Further evaluation with her blood work revealed that her HGB was at 9 g/dL.  Review of her HGB she was noted to have a baseline HGB in the 9-10 range.  Within the past couple of years she reports having a colonoscopy with Dr. Collene Mares for anemia.  In fact, she states that she had the procedure twice, but Dr. Collene Mares refused to repeat the procedure.  Her health was too poor.  No prior EGDs.  Since 2003 she has been on Plavix as well as ASA.  Her Cardiologist held her PPI to prevent any interference with Plavix.  Since that time she states that she has to use ranitidine frequently.  Past Medical History  Diagnosis Date  . CAD (coronary artery disease)   . Atherosclerosis of abdominal aorta   . On home oxygen therapy   . Tobacco abuse   . HTN (hypertension)   . COPD (chronic obstructive pulmonary disease)   . PVD (peripheral vascular disease)   . Anxiety   . GERD (gastroesophageal reflux disease)   . RBBB (right bundle branch block)   . Tachycardia   . High cholesterol   . Renal insufficiency   . Leukocytosis   . Endometrial carcinoma   . CHF (congestive heart failure)   . Myocardial infarction     " MILD "  . Shortness of breath   . UTI (urinary tract infection)     Past Surgical History  Procedure Laterality Date  . Coronary angioplasty    . Abdominal hysterectomy    . Carotid stent    . Tonsillectomy    . Arm surgery      PLATES AND SCREWS  . Breast reduction surgery    . Femoral artery stent      Family History  Problem Relation Age of Onset  . Heart attack  Father   . Coronary artery disease Father   . Leukemia Mother   . Leukemia Brother     Social History:  reports that she has been smoking Cigarettes.  She has a 53 pack-year smoking history. She has never used smokeless tobacco. She reports that she drinks alcohol. She reports that she does not use illicit drugs.  Allergies:  Allergies  Allergen Reactions  . Uloric [Febuxostat] Other (See Comments)    Unknown reaction  . Zolpidem Tartrate Other (See Comments)    hallucinations  . Ciprofloxacin Anxiety and Other (See Comments)    Shakiness  . Contrast Media [Iodinated Diagnostic Agents] Rash  . Tetracycline Rash  . Vicodin [Hydrocodone-Acetaminophen] Anxiety    Medications:  Scheduled: . allopurinol  100 mg Oral BID  . arformoterol  15 mcg Nebulization Daily  . atorvastatin  40 mg Oral q1800  . budesonide  0.5 mg Nebulization Daily  . diltiazem  240 mg Oral Daily  . furosemide  40 mg Oral Daily  . nebivolol  5 mg Oral Daily  . pantoprazole (PROTONIX) IV  40 mg Intravenous Q12H  . [START ON 10/04/2013] predniSONE  40 mg Oral Q breakfast  . sodium chloride  3  mL Intravenous Q12H  . tiotropium  18 mcg Inhalation Daily  . venlafaxine XR  75 mg Oral Daily   Continuous:   Results for orders placed during the hospital encounter of 10/03/13 (from the past 24 hour(s))  CBC     Status: Abnormal   Collection Time    10/03/13 11:43 AM      Result Value Ref Range   WBC 12.5 (*) 4.0 - 10.5 K/uL   RBC 2.86 (*) 3.87 - 5.11 MIL/uL   Hemoglobin 9.1 (*) 12.0 - 15.0 g/dL   HCT 28.6 (*) 36.0 - 46.0 %   MCV 100.0  78.0 - 100.0 fL   MCH 31.8  26.0 - 34.0 pg   MCHC 31.8  30.0 - 36.0 g/dL   RDW 15.7 (*) 11.5 - 15.5 %   Platelets 147 (*) 150 - 400 K/uL  BASIC METABOLIC PANEL     Status: Abnormal   Collection Time    10/03/13 11:43 AM      Result Value Ref Range   Sodium 140  137 - 147 mEq/L   Potassium 3.9  3.7 - 5.3 mEq/L   Chloride 97  96 - 112 mEq/L   CO2 30  19 - 32 mEq/L    Glucose, Bld 80  70 - 99 mg/dL   BUN 46 (*) 6 - 23 mg/dL   Creatinine, Ser 1.75 (*) 0.50 - 1.10 mg/dL   Calcium 9.0  8.4 - 10.5 mg/dL   GFR calc non Af Amer 28 (*) >90 mL/min   GFR calc Af Amer 32 (*) >90 mL/min  PRO B NATRIURETIC PEPTIDE     Status: Abnormal   Collection Time    10/03/13 11:43 AM      Result Value Ref Range   Pro B Natriuretic peptide (BNP) 3256.0 (*) 0 - 125 pg/mL  I-STAT TROPOININ, ED     Status: None   Collection Time    10/03/13  1:05 PM      Result Value Ref Range   Troponin i, poc 0.06  0.00 - 0.08 ng/mL   Comment 3           POC OCCULT BLOOD, ED     Status: Abnormal   Collection Time    10/03/13  1:25 PM      Result Value Ref Range   Fecal Occult Bld POSITIVE (*) NEGATIVE  TYPE AND SCREEN     Status: None   Collection Time    10/03/13  3:05 PM      Result Value Ref Range   ABO/RH(D) A NEG     Antibody Screen NEG     Sample Expiration 10/06/2013       Dg Chest 2 View  10/03/2013   CLINICAL DATA:  Heart palpitations.  EXAM: CHEST  2 VIEW  COMPARISON:  Chest x-ray 11/02/2012.  FINDINGS: Mediastinum and hilar structures are normal. Bibasilar atelectasis and/or scarring again noted. Stable pleural thickening. No focal alveolar infiltrate. Heart size stable. No pulmonary venous congestion. COPD. No acute bony abnormality. Degenerative changes thoracic spine.  IMPRESSION: Stable chest with changes of basilar pleural-parenchymal scarring and atelectasis. No acute abnormality.   Electronically Signed   By: Marcello Moores  Register   On: 10/03/2013 12:58    ROS:  As stated above in the HPI otherwise negative.  Blood pressure 129/77, pulse 81, temperature 98.5 F (36.9 C), temperature source Oral, resp. rate 20, height 5\' 8"  (1.727 m), weight 167 lb 12.3 oz (76.1 kg), SpO2 97.00%.  PE: Gen: NAD, Alert and Oriented HEENT:  [place]/AT, EOMI Neck: Supple, no LAD Lungs: CTA Bilaterally CV: RRR without M/G/R ABM: Soft, NTND, +BS Ext: No C/C/E  Assessment/Plan: 1)  Melena. 2) Anemia. 3) Heme positive stool.   I do not have access to her office records at this time to see her prior colonoscopy reports.  With her history of "black tarry stools" I will perform an EGD.  Her respiratory status is poor and she is high risk.  She understands that I will keep her sedation light.  Plan: 1) EGD tomorrow.  If the EGD is negative a colonoscopy will be scheduled.  Beryle Beams 10/03/2013, 5:27 PM

## 2013-10-03 NOTE — ED Notes (Signed)
Reports feeling bad for extended amount of time, having generalized fatigue and palpitations. HR 81 at triage. Reports recently started on prednisone, has not checked cbgs and complains of dry mouth.

## 2013-10-03 NOTE — Progress Notes (Signed)
Patient arrived on unit from ED.  Vital signs stable.  Patient and husband deny any questions or concerns at this time.  Will continue to monitor.

## 2013-10-03 NOTE — Progress Notes (Signed)
Patient viewed upper GI video prior to speaking with Dr. Benson Norway.  Consent for patient's EGD, scheduled for tomorrow, signed and in chart.  Patient and husband deny any additional questions or concerns at this time.  Will continue to monitor.

## 2013-10-04 ENCOUNTER — Encounter (HOSPITAL_COMMUNITY)
Admission: EM | Disposition: A | Discharge status: Home Health | Payer: Self-pay | Source: Home / Self Care | Attending: Internal Medicine

## 2013-10-04 ENCOUNTER — Encounter (HOSPITAL_COMMUNITY): Payer: Self-pay

## 2013-10-04 ENCOUNTER — Inpatient Hospital Stay (HOSPITAL_COMMUNITY): Payer: Medicare (Managed Care) | Admitting: Anesthesiology

## 2013-10-04 ENCOUNTER — Encounter (HOSPITAL_COMMUNITY): Payer: Medicare (Managed Care) | Admitting: Anesthesiology

## 2013-10-04 DIAGNOSIS — D72829 Elevated white blood cell count, unspecified: Secondary | ICD-10-CM

## 2013-10-04 DIAGNOSIS — J449 Chronic obstructive pulmonary disease, unspecified: Secondary | ICD-10-CM

## 2013-10-04 DIAGNOSIS — D649 Anemia, unspecified: Secondary | ICD-10-CM

## 2013-10-04 DIAGNOSIS — I517 Cardiomegaly: Secondary | ICD-10-CM

## 2013-10-04 HISTORY — PX: ESOPHAGOGASTRODUODENOSCOPY: SHX5428

## 2013-10-04 LAB — BASIC METABOLIC PANEL
BUN: 40 mg/dL — ABNORMAL HIGH (ref 6–23)
CALCIUM: 8.7 mg/dL (ref 8.4–10.5)
CHLORIDE: 95 meq/L — AB (ref 96–112)
CO2: 28 meq/L (ref 19–32)
CREATININE: 1.81 mg/dL — AB (ref 0.50–1.10)
GFR calc Af Amer: 31 mL/min — ABNORMAL LOW (ref >90)
GFR calc non Af Amer: 27 mL/min — ABNORMAL LOW (ref >90)
GLUCOSE: 112 mg/dL — AB (ref 70–99)
Potassium: 4 mEq/L (ref 3.7–5.3)
Sodium: 138 mEq/L (ref 137–147)

## 2013-10-04 LAB — CBC
HCT: 26.7 % — ABNORMAL LOW (ref 36.0–46.0)
HEMOGLOBIN: 8.5 g/dL — AB (ref 12.0–15.0)
MCH: 31.5 pg (ref 26.0–34.0)
MCHC: 31.8 g/dL (ref 30.0–36.0)
MCV: 98.9 fL (ref 78.0–100.0)
Platelets: 140 10*3/uL — ABNORMAL LOW (ref 150–400)
RBC: 2.7 MIL/uL — AB (ref 3.87–5.11)
RDW: 15.9 % — ABNORMAL HIGH (ref 11.5–15.5)
WBC: 10.9 10*3/uL — ABNORMAL HIGH (ref 4.0–10.5)

## 2013-10-04 SURGERY — EGD (ESOPHAGOGASTRODUODENOSCOPY)
Anesthesia: Monitor Anesthesia Care

## 2013-10-04 MED ORDER — LIDOCAINE HCL (CARDIAC) 20 MG/ML IV SOLN
INTRAVENOUS | Status: DC | PRN
  Administered 2013-10-04: 20 mg via INTRAVENOUS

## 2013-10-04 MED ORDER — LACTATED RINGERS IV SOLN
INTRAVENOUS | Status: DC | PRN
  Administered 2013-10-04: 10:00:00 via INTRAVENOUS

## 2013-10-04 MED ORDER — BIOTENE DRY MOUTH MT LIQD
15.0000 mL | Freq: Two times a day (BID) | OROMUCOSAL | Status: DC
  Administered 2013-10-04 (×2): 15 mL via OROMUCOSAL

## 2013-10-04 MED ORDER — ADULT MULTIVITAMIN W/MINERALS CH
1.0000 | ORAL_TABLET | Freq: Every day | ORAL | Status: DC
  Administered 2013-10-05: 1 via ORAL
  Filled 2013-10-04: qty 1

## 2013-10-04 MED ORDER — CHLORHEXIDINE GLUCONATE 0.12 % MT SOLN
15.0000 mL | Freq: Two times a day (BID) | OROMUCOSAL | Status: DC
  Administered 2013-10-05: 15 mL via OROMUCOSAL
  Filled 2013-10-04 (×3): qty 15

## 2013-10-04 MED ORDER — FENTANYL CITRATE 0.05 MG/ML IJ SOLN
INTRAMUSCULAR | Status: DC | PRN
  Administered 2013-10-04: 25 ug via INTRAVENOUS

## 2013-10-04 MED ORDER — BUTAMBEN-TETRACAINE-BENZOCAINE 2-2-14 % EX AERO
INHALATION_SPRAY | CUTANEOUS | Status: DC | PRN
  Administered 2013-10-04 (×2): 1 via TOPICAL

## 2013-10-04 MED ORDER — MIDAZOLAM HCL 5 MG/5ML IJ SOLN
INTRAMUSCULAR | Status: DC | PRN
  Administered 2013-10-04 (×2): .5 mg via INTRAVENOUS

## 2013-10-04 MED ORDER — LACTATED RINGERS IV SOLN
INTRAVENOUS | Status: DC
  Administered 2013-10-04: 1000 mL via INTRAVENOUS

## 2013-10-04 MED ORDER — PROPOFOL 10 MG/ML IV BOLUS
INTRAVENOUS | Status: DC | PRN
  Administered 2013-10-04 (×2): 10 mg via INTRAVENOUS

## 2013-10-04 MED ORDER — PANTOPRAZOLE SODIUM 40 MG PO TBEC
40.0000 mg | DELAYED_RELEASE_TABLET | Freq: Two times a day (BID) | ORAL | Status: DC
  Administered 2013-10-04 – 2013-10-05 (×2): 40 mg via ORAL
  Filled 2013-10-04 (×2): qty 1

## 2013-10-04 NOTE — Op Note (Addendum)
[facility] [place], [id number]   OPERATIVE PROCEDURE REPORT  PATIENT: Wanda Yang, Wanda Yang  MR#: [id number] BIRTHDATE: 1941-01-23  GENDER: Female ENDOSCOPIST: Carol Ada, MD ASSISTANT:   Corwin Levins, RN and Cristopher Estimable, technician PROCEDURE DATE: 10/04/2013 PROCEDURE:   EGD ASA CLASS:   Class IV INDICATIONS: Melena, Anemia, and Heme positive stool MEDICATIONS: MAC sedation, administered by CRNA TOPICAL ANESTHETIC:   Cetacaine Spray  DESCRIPTION OF PROCEDURE:   After the risks benefits and alternatives of the procedure were thoroughly explained, informed consent was obtained.  The PENTAX GASTOROSCOPE [id number]  endoscope was introduced through the mouth  and advanced to the second portion of the duodenum Without limitations.      The instrument was slowly withdrawn as the mucosa was fully examined.      FINDINGS: The esophagus was normal.  No evidence of any inflammation.  The gastric lumen was essentially normal.  A minimal erythema was noted in the antrum.  No evidence of any ulcerations, erosions, or vascular abnormalities.  The duodenum was also negative for any evidence of bleeding, but a diverticulum was noted in the second portion of the duodenum.          The scope was then withdrawn from the patient and the procedure terminated.  COMPLICATIONS: There were no complications.  IMPRESSION: 1) Duodenal diverticulum.  RECOMMENDATIONS: 1) Follow HGB and transfuse as necessary. 2) I will discuss the case with Dr. Collene Mares to see if she wants to pursue a colonoscopy.  She had a colonoscopy in 2008 with Dr. Collene Mares with findings of hyperplastic polyps.  A virtual colonoscopy was performed 5 years ago with no clearly defined pathology, although there is the possibility of a distal polyp or retained untagged stool.  I presume the virtual colonoscopy was performed as a result of her worsening pulmonary  status.  _______________________________ eSignedCarol Ada, MD 10/04/2013 10:23 AM

## 2013-10-04 NOTE — Progress Notes (Signed)
INITIAL NUTRITION ASSESSMENT  DOCUMENTATION CODES Per approved criteria  -Not Applicable   INTERVENTION: Provide Resource Breeze TID with meals, each supplement provides 250 kcal and 9 grams of protein RD to continue to monitor  NUTRITION DIAGNOSIS: Predicted suboptimal energy intake related to pt report of decreased appetite as evidenced by pt's chart, 7% weight loss in less than 2 months.   Goal: Pt to meet >/= 90% of their estimated nutrition needs   Monitor:  PO intake, weight trend, labs  Reason for Assessment: Malnutrition Screening Tool, score of 5  73 y.o. female  Admitting Dx: <principal problem not specified>  ASSESSMENT: 73 y.o. female with PMH significant for CAD, HTN, CKD stage IV, COPD on 3 L oxygen at home, Diastolic heart Failure, who presents complaining of generalized weakness, fatigue for last week. She feels tire, low energy. She denies worsening dyspnea. No chest pain. She relates black stool, last episode last week. She also notice at that time bright red blood in the stool. Pt underwent EGD today. Pt asleep at time of visit.  Per MST report, pt reported decreased appetite and 65 lb weight loss in the past 6 months. Per weight history below, pt has lost 13 lbs within the past 7 weeks- 7.3% weight loss. Total of 13% weight loss ( 24 lbs) in the past year.  Per MD note, pt with weakness due to GI bleed, COPD, diastolic heart failure, and CKD stage IV. Pt would likely benefit from [employer] with meals.   Labs: low hemoglobin, elevated BUN, low GFR, glucose 80-112 mg/dL  Height: Ht Readings from Last 1 Encounters:  10/03/13 5\' 8"  (1.727 m)    Weight: Wt Readings from Last 1 Encounters:  10/04/13 166 lb 10.7 oz (75.6 kg)    Ideal Body Weight: 140 lbs  % Ideal Body Weight: 119%  Wt Readings from Last 10 Encounters:  10/04/13 166 lb 10.7 oz (75.6 kg)  10/04/13 166 lb 10.7 oz (75.6 kg)  08/20/13 179 lb (81.194 kg)  07/15/13 177 lb (80.287 kg)   01/29/13 184 lb 9.6 oz (83.734 kg)  12/21/12 181 lb (82.101 kg)  11/26/12 187 lb (84.823 kg)  11/05/12 186 lb 12.8 oz (84.732 kg)  10/02/12 190 lb 12.8 oz (86.546 kg)  08/01/12 192 lb 3.2 oz (87.181 kg)    Usual Body Weight: 190 lbs  % Usual Body Weight: 87%  BMI:  Body mass index is 25.35 kg/(m^2).  Estimated Nutritional Needs: Kcal: 1800-2000 Protein: 75-85 grams Fluid: 1.8-2 L/day  Skin: +2 RLE and LLE edema; closed incision on left back  Diet Order: General  EDUCATION NEEDS: -Education not appropriate at this time   Intake/Output Summary (Last 24 hours) at 10/04/13 1131 Last data filed at 10/04/13 1045  Gross per 24 hour  Intake    440 ml  Output   1025 ml  Net   -585 ml    Last BM: 4/30  Labs:   Recent Labs Lab 10/03/13 1143 10/04/13 0624  NA 140 138  K 3.9 4.0  CL 97 95*  CO2 30 28  BUN 46* 40*  CREATININE 1.75* 1.81*  CALCIUM 9.0 8.7  GLUCOSE 80 112*    CBG (last 3)  No results found for this basename: GLUCAP,  in the last 72 hours  Scheduled Meds: . allopurinol  100 mg Oral BID  . antiseptic oral rinse  15 mL Mouth Rinse q12n4p  . arformoterol  15 mcg Nebulization Daily  . atorvastatin  40 mg Oral  q1800  . budesonide  0.5 mg Nebulization Daily  . [START ON 10/05/2013] chlorhexidine  15 mL Mouth Rinse BID  . diltiazem  240 mg Oral Daily  . furosemide  40 mg Oral Daily  . nebivolol  5 mg Oral Daily  . pantoprazole  40 mg Oral BID  . predniSONE  40 mg Oral Q breakfast  . tiotropium  18 mcg Inhalation Daily  . venlafaxine XR  75 mg Oral Daily    Continuous Infusions: . lactated ringers 1,000 mL (10/04/13 0930)    Past Medical History  Diagnosis Date  . CAD (coronary artery disease)   . Atherosclerosis of abdominal aorta   . On home oxygen therapy     "3L; 24/7" (10/03/2013)  . Tobacco abuse   . HTN (hypertension)   . COPD (chronic obstructive pulmonary disease)   . PVD (peripheral vascular disease)   . Anxiety   . GERD  (gastroesophageal reflux disease)   . RBBB (right bundle branch block)   . Tachycardia   . High cholesterol   . Leukocytosis   . CHF (congestive heart failure)   . Shortness of breath   . UTI (urinary tract infection)   . CKD (chronic kidney disease), stage IV   . Temporal arteritis dx'd 08/2013  . Endometrial carcinoma   . Uterine cancer   . Myocardial infarction ~ 2011    " MILD "  . Anemia   . History of blood transfusion     "S/P anemia for 3 years"  . Daily headache     "last few days" (10/03/2013)  . Arthritis     "hands and fingers" (10/03/2013)  . Chronic lower back pain   . Depression     "sometimes; not often" (10/03/2013)  . Lower GI bleeding 02/2012; 10/03/2013    Past Surgical History  Procedure Laterality Date  . Carotid stent    . Tonsillectomy    . Forearm fracture surgery Left ~ 2005    PLATES AND SCREWS  . Femoral artery stent    . Reduction mammaplasty    . Wrist fracture surgery Left ~ 2005    "it was crushed; they glued it together"  . Vaginal hysterectomy    . Coronary angioplasty with stent placement      "1 + +1"  . Iliac artery stent Bilateral     Pryor Ochoa RD, LDN Inpatient Clinical Dietitian Pager: [phone] After Hours Pager: [phone]

## 2013-10-04 NOTE — Transfer of Care (Signed)
Immediate Anesthesia Transfer of Care Note  Patient: Wanda Yang  Procedure(s) Performed: Procedure(s): ESOPHAGOGASTRODUODENOSCOPY (EGD) (N/A)  Patient Location: PACU and Endoscopy Unit  Anesthesia Type:MAC  Level of Consciousness: awake, alert  and oriented  Airway & Oxygen Therapy: Patient Spontanous Breathing and Patient connected to nasal cannula oxygen  Post-op Assessment: Report given to PACU RN and Post -op Vital signs reviewed and stable  Post vital signs: Reviewed and stable  Complications: No apparent anesthesia complications

## 2013-10-04 NOTE — Anesthesia Preprocedure Evaluation (Addendum)
Anesthesia Evaluation  Patient identified by MRN, date of birth, ID band Patient awake    Reviewed: Allergy & Precautions, H&P , NPO status , Patient's Chart, lab work & pertinent test results  Airway Mallampati: II TM Distance: >3 FB Neck ROM: Full    Dental  (+) Dental Advisory Given   Pulmonary shortness of breath, COPD oxygen dependent, Current Smoker,  + rhonchi         Cardiovascular hypertension, + CAD, + Past MI, + Cardiac Stents, + Peripheral Vascular Disease and +CHF Rhythm:Regular Rate:Normal     Neuro/Psych  Headaches,    GI/Hepatic GERD-  ,  Endo/Other    Renal/GU Renal InsufficiencyRenal disease     Musculoskeletal   Abdominal   Peds  Hematology   Anesthesia Other Findings   Reproductive/Obstetrics                          Anesthesia Physical Anesthesia Plan  ASA: IV  Anesthesia Plan: MAC   Post-op Pain Management:    Induction: Intravenous  Airway Management Planned: Nasal Cannula  Additional Equipment:   Intra-op Plan:   Post-operative Plan:   Informed Consent: I have reviewed the patients History and Physical, chart, labs and discussed the procedure including the risks, benefits and alternatives for the proposed anesthesia with the patient or authorized representative who has indicated his/her understanding and acceptance.     Plan Discussed with: CRNA and Surgeon  Anesthesia Plan Comments:         Anesthesia Quick Evaluation

## 2013-10-04 NOTE — Progress Notes (Signed)
Chaplain met with pt and family.  Pt was alert and responsive and requested prayer.  Chaplain prayed with family.  They seemed appreciative of the visit.

## 2013-10-04 NOTE — Progress Notes (Signed)
  Echocardiogram 2D Echocardiogram has been performed.  Roby 10/04/2013, 3:19 PM

## 2013-10-04 NOTE — Progress Notes (Signed)
[facility] PROGRESS NOTE Assessment/Plan: Weakness due to GI bleed/ABLA: - started IV protonix, consulted Gi. 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 - change to protonix oral. - hbg stable. ? When to resume plavix and ASA.  COPD, emphysema; -  continue with nebulizer treatments. On 3 L oxygen at home.   Diastolic Heart failure;  - Appears to be Euvolemic. - Continue with lasix. BNP lower than before.   CKD stage IV;  - cr better than before. Last cr per our records was at 3. 0. Cr on admission at 1.7. Monitor on lasix.   CAD;  - hold plavix and aspirin in setting of acute bleed,.     Code Status: presume full code.  Family Communication: Care discussed with patient and husband.  Disposition Plan: expect 2 to 3 days inpatient.     Consultants:  GI  Procedures:  EGD as above  Antibiotics:  None  HPI/Subjective: No complains  Objective: Filed Vitals:   10/04/13 1045 10/04/13 1050 10/04/13 1113 10/04/13 1114  BP: 169/67 169/70 120/60   Pulse: 75 75 78   Temp:      TempSrc:      Resp: 18 18 18    Height:      Weight:      SpO2: 96% 96% 98% 96%    Intake/Output Summary (Last 24 hours) at 10/04/13 1117 Last data filed at 10/04/13 1045  Gross per 24 hour  Intake    440 ml  Output   1025 ml  Net   -585 ml   Filed Weights   10/03/13 1141 10/03/13 1608 10/04/13 0539  Weight: 81.194 kg (179 lb) 76.1 kg (167 lb 12.3 oz) 75.6 kg (166 lb 10.7 oz)    Exam:  General: Alert, awake, oriented x3, in no acute distress.  HEENT: No bruits, no goiter.  Heart: Regular rate and rhythm, without murmurs, rubs, gallops.  Lungs: Good air movement, clear Abdomen: Soft, nontender, nondistended, positive bowel sounds.    Data Reviewed: Basic Metabolic Panel:  Recent Labs Lab 10/03/13 1143 10/04/13 0624  NA 140 138  K 3.9 4.0  CL 97 95*  CO2 30 28  GLUCOSE 80 112*  BUN 46* 40*  CREATININE 1.75* 1.81*  CALCIUM 9.0 8.7   Liver Function  Tests: No results found for this basename: AST, ALT, ALKPHOS, BILITOT, PROT, ALBUMIN,  in the last 168 hours No results found for this basename: LIPASE, AMYLASE,  in the last 168 hours No results found for this basename: AMMONIA,  in the last 168 hours CBC:  Recent Labs Lab 10/03/13 1143 10/04/13 0624  WBC 12.5* 10.9*  HGB 9.1* 8.5*  HCT 28.6* 26.7*  MCV 100.0 98.9  PLT 147* 140*   Cardiac Enzymes: No results found for this basename: CKTOTAL, CKMB, CKMBINDEX, TROPONINI,  in the last 168 hours BNP (last 3 results)  Recent Labs  11/01/12 1149 10/03/13 1143  PROBNP 7067.0* 3256.0*   CBG: No results found for this basename: GLUCAP,  in the last 168 hours  No results found for this or any previous visit (from the past 240 hour(s)).   Studies: Dg Chest 2 View  10/03/2013   CLINICAL DATA:  Heart palpitations.  EXAM: CHEST  2 VIEW  COMPARISON:  Chest x-ray 11/02/2012.  FINDINGS: Mediastinum and hilar structures are normal. Bibasilar atelectasis and/or scarring again noted. Stable pleural thickening. No focal alveolar infiltrate. Heart size stable. No pulmonary venous congestion. COPD. No acute bony abnormality. Degenerative changes thoracic spine.  IMPRESSION: Stable chest with changes of basilar pleural-parenchymal scarring and atelectasis. No acute abnormality.   Electronically Signed   By: Marcello Moores  Register   On: 10/03/2013 12:58    Scheduled Meds: . allopurinol  100 mg Oral BID  . antiseptic oral rinse  15 mL Mouth Rinse q12n4p  . arformoterol  15 mcg Nebulization Daily  . atorvastatin  40 mg Oral q1800  . budesonide  0.5 mg Nebulization Daily  . [START ON 10/05/2013] chlorhexidine  15 mL Mouth Rinse BID  . diltiazem  240 mg Oral Daily  . furosemide  40 mg Oral Daily  . nebivolol  5 mg Oral Daily  . pantoprazole  40 mg Oral BID  . predniSONE  40 mg Oral Q breakfast  . tiotropium  18 mcg Inhalation Daily  . venlafaxine XR  75 mg Oral Daily   Continuous Infusions: .  lactated ringers 1,000 mL (10/04/13 0930)     Charlynne Cousins  [facility] Pager [phone]. If 8PM-8AM, please contact night-coverage at [email or web address], password [facility] 10/04/2013, 11:17 AM  LOS: 1 day

## 2013-10-04 NOTE — Evaluation (Signed)
Physical Therapy Evaluation Patient Details Name: Wanda Yang MRN: [id number] DOB: 11-09-1940 Today's Date: 10/04/2013   History of Present Illness  Wanda Yang is a 73 y.o. female with PMH significant for CAD, HTN, CKD stage IV, COPD on 3 L oxygen at home, Diastolic heart Failure, who presents complaining of generalized weakness, fatigue for last week.  She relates black stool, last episode last week. She also notice at that time bright red blood in the stool.  Her hb on admission was at 9, she was found to have positive occult blood.  Clinical Impression  Patient presents with decreased independence with mobility due to deficits listed in PT problem list.  She will benefit from skilled PT in the acute setting to allow return home with HHPT and spouse assist.  Of note patient positive for orthostatic hypotension, but no other signs of dizziness with vestibular eval ordered, did not complete.    Follow Up Recommendations Home health PT;Supervision/Assistance - 24 hour    Equipment Recommendations  None recommended by PT    Recommendations for Other Services       Precautions / Restrictions Precautions Precautions: Fall      Mobility  Bed Mobility Overal bed mobility: Needs Assistance Bed Mobility: Supine to Sit;Sit to Supine     Supine to sit: Min guard;HOB elevated Sit to supine: Min guard;HOB elevated      Transfers Overall transfer level: Needs assistance Equipment used: Pushed w/c Transfers: Sit to/from Stand Sit to Stand: Min assist         General transfer comment: assist for balance/lifting  Ambulation/Gait Ambulation/Gait assistance: Min assist Ambulation Distance (Feet): 120 Feet Assistive device:  (pushed w/c) Gait Pattern/deviations: Step-through pattern;Decreased stride length;Shuffle     General Gait Details: one episode of right knee buckling min assist to recover  Stairs            Wheelchair Mobility    Modified Rankin (Stroke  Patients Only)       Balance Overall balance assessment: Needs assistance           Standing balance-Leahy Scale: Poor Standing balance comment: needs UE assist for balance                             Pertinent Vitals/Pain No pain complaints Orthostatic BPs  Supine NT  Sitting 109/68  Sitting after N/A min NT  Standing 87/63  Standing after 2 min 97/62      Home Living Family/patient expects to be discharged to:: Private residence Living Arrangements: Spouse/significant other Available Help at Discharge: Family;Available 24 hours/day Type of Home: House Home Access: Stairs to enter Entrance Stairs-Rails: Right Entrance Stairs-Number of Steps: 5 in garage Home Layout: Two level;Able to live on main level with bedroom/bathroom Home Equipment: Gilford Rile - 2 wheels;Bedside commode;Shower seat;Cane - single point      Prior Function Level of Independence: Independent         Comments: cant stand up long due to back issues     Hand Dominance   Dominant Hand: Right    Extremity/Trunk Assessment               Lower Extremity Assessment: RLE deficits/detail;LLE deficits/detail RLE Deficits / Details: AROM WFL, strength hip flexion 3-/5, knee extension 4/5, ankle DF 4/5 LLE Deficits / Details: AROM WFL, strength hip flexion 3-/5, knee extension 4/5, ankle DF 4/5     Communication   Communication: No difficulties  Cognition Arousal/Alertness: Awake/alert Behavior During Therapy: WFL for tasks assessed/performed Overall Cognitive Status: Within Functional Limits for tasks assessed                      General Comments      Exercises        Assessment/Plan    PT Assessment Patient needs continued PT services  PT Diagnosis Difficulty walking;Generalized weakness   PT Problem List Decreased strength;Decreased mobility;Decreased activity tolerance;Decreased balance  PT Treatment Interventions DME instruction;Gait training;Stair  training;Functional mobility training;Therapeutic activities;Patient/family education;Balance training;Therapeutic exercise   PT Goals (Current goals can be found in the Care Plan section) Acute Rehab PT Goals Patient Stated Goal: To return home  PT Goal Formulation: With patient Time For Goal Achievement: 10/18/13 Potential to Achieve Goals: Good    Frequency Min 3X/week   Barriers to discharge        Co-evaluation               End of Session Equipment Utilized During Treatment: Gait belt;Oxygen Activity Tolerance: Patient tolerated treatment well Patient left: in bed;with call bell/phone within reach;with bed alarm set           Time: 8264-1583 PT Time Calculation (min): 27 min   Charges:   PT Evaluation $Initial PT Evaluation Tier I: 1 Procedure PT Treatments $Gait Training: 8-22 mins   PT G Codes:          Max Sane 10/04/2013, 4:11 PM Magda Kiel, [place] 10/04/2013

## 2013-10-04 NOTE — Progress Notes (Signed)
UR COMPLETED  

## 2013-10-04 NOTE — Anesthesia Postprocedure Evaluation (Signed)
  Anesthesia Post-op Note  Patient: Wanda Yang  Procedure(s) Performed: Procedure(s): ESOPHAGOGASTRODUODENOSCOPY (EGD) (N/A)  Patient Location: Endoscopy Unit  Anesthesia Type:MAC  Level of Consciousness: awake  Airway and Oxygen Therapy: Patient Spontanous Breathing  Post-op Pain: none  Post-op Assessment: Post-op Vital signs reviewed, Patient's Cardiovascular Status Stable, Respiratory Function Stable, Patent Airway, No signs of Nausea or vomiting and Pain level controlled  Post-op Vital Signs: Reviewed and stable  Last Vitals:  Filed Vitals:   10/04/13 1023  BP: 132/53  Pulse: 80  Temp: 36.8 C  Resp: 17    Complications: No apparent anesthesia complications

## 2013-10-05 DIAGNOSIS — I10 Essential (primary) hypertension: Secondary | ICD-10-CM

## 2013-10-05 MED ORDER — ASPIRIN 81 MG PO TABS: 81.0000 mg | ORAL_TABLET | Freq: Every day | ORAL | Status: DC

## 2013-10-05 MED ORDER — PANTOPRAZOLE SODIUM 40 MG PO TBEC: 40.0000 mg | DELAYED_RELEASE_TABLET | Freq: Two times a day (BID) | ORAL | Status: DC

## 2013-10-05 MED ORDER — FERROUS SULFATE 325 (65 FE) MG PO TABS: 325.0000 mg | ORAL_TABLET | Freq: Every day | ORAL | Status: DC

## 2013-10-05 MED ORDER — CLOPIDOGREL BISULFATE 75 MG PO TABS: 75.0000 mg | ORAL_TABLET | Freq: Every day | ORAL | Status: DC

## 2013-10-05 NOTE — Discharge Summary (Signed)
Physician Discharge Summary  Wanda Yang GHW:[id number] DOB: 03/09/1941 DOA: 10/03/2013  PCP: Stephens Shire, MD  Admit date: 10/03/2013 Discharge date: 10/05/2013  Time spent: 35 minutes  Recommendations for Outpatient Follow-up:  1. Follow up with Dr,Hung.Mann in 2 weeks to review pathology report.   Discharge Diagnoses:  Active Problems:   Chronic respiratory failure   COPD (chronic obstructive pulmonary disease)   GI bleed   Weakness   Discharge Condition: stable  Diet recommendation: heart healthy  Filed Weights   10/03/13 1608 10/04/13 0539 10/05/13 0450  Weight: 76.1 kg (167 lb 12.3 oz) 75.6 kg (166 lb 10.7 oz) 75.524 kg (166 lb 8 oz)    History of present illness:  73 y.o. female with PMH significant for CAD, HTN, CKD stage IV, COPD on 3 L oxygen at home, Diastolic heart Failure, who presents complaining of generalized weakness, fatigue for last week. She feels tire, low energy. She denies worsening dyspnea. No chest pain. She relates black stool, last episode last week. She also notice at that time bright red blood in the stool. She stop taking iron but stool continue to be black. Her hb on admission was at 9, she was found to have positive occult blood. She also relates constipation.    Hospital Course:  Weakness due to GI bleed/ABLA:  - started IV protonix, consulted Gi.  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  - change to protonix oral.  - hbg stable.  - resume plavix and ASA as an outpatient in 2 weeks.  COPD, emphysema;  - continue with nebulizer treatments. On 3 L oxygen at home.   Diastolic Heart failure;  - Appears to be Euvolemic.  - Continue with lasix. BNP lower than before.   CKD stage IV;  - cr better than before. Last cr per our records was at 3. 0. Cr on admission at 1.7. Monitor on lasix.   CAD;  - hold plavix and aspirin in setting of acute bleed,.    Procedures:  EGD: duodenal diverticulum  Consultations:  GI: Dr.  Benson Norway  Discharge Exam: Filed Vitals:   10/05/13 0450  BP: 150/65  Pulse: 84  Temp: 98.2 F (36.8 C)  Resp: 18    General: A&O x3 Cardiovascular: RRR Respiratory: good air movement CVT B/L  Discharge Instructions You were cared for by a hospitalist during your hospital stay. If you have any questions about your discharge medications or the care you received while you were in the hospital after you are discharged, you can call the unit and asked to speak with the hospitalist on call if the hospitalist that took care of you is not available. Once you are discharged, your primary care physician will handle any further medical issues. Please note that NO REFILLS for any discharge medications will be authorized once you are discharged, as it is imperative that you return to your primary care physician (or establish a relationship with a primary care physician if you do not have one) for your aftercare needs so that they can reassess your need for medications and monitor your lab values.      Discharge Orders   Future Appointments Provider Department Dept Phone   02/20/2014 11:30 AM Kathee Delton, MD [facility] [phone]   Future Orders Complete By Expires   Diet - low sodium heart healthy  As directed    Increase activity slowly  As directed        Medication List  albuterol 108 (90 BASE) MCG/ACT inhaler  Commonly known as:  PROVENTIL HFA;VENTOLIN HFA  Inhale 2 puffs into the lungs every 4 (four) hours as needed for wheezing or shortness of breath.     allopurinol 100 MG tablet  Commonly known as:  ZYLOPRIM  Take 100 mg by mouth 2 (two) times daily.     arformoterol 15 MCG/2ML Nebu  Commonly known as:  BROVANA  Take 15 mcg by nebulization daily.     aspirin 81 MG tablet  Take 1 tablet (81 mg total) by mouth daily.  Start taking on:  10/17/2013     budesonide 0.5 MG/2ML nebulizer solution  Commonly known as:  PULMICORT  Take 0.5 mg by nebulization  daily.     clopidogrel 75 MG tablet  Commonly known as:  PLAVIX  Take 1 tablet (75 mg total) by mouth daily.  Start taking on:  10/17/2013     diltiazem 240 MG 24 hr capsule  Commonly known as:  DILACOR XR  Take 1 capsule (240 mg total) by mouth daily.     famotidine 20 MG tablet  Commonly known as:  PEPCID  Take 20 mg by mouth 2 (two) times daily as needed for heartburn.     ferrous sulfate 325 (65 FE) MG tablet  Take 1 tablet (325 mg total) by mouth daily with breakfast.     furosemide 40 MG tablet  Commonly known as:  LASIX  Take 40 mg by mouth daily. For weight gain     nebivolol 5 MG tablet  Commonly known as:  BYSTOLIC  Take 5 mg by mouth daily.     nitroGLYCERIN 0.4 MG SL tablet  Commonly known as:  NITROSTAT  Place 0.4 mg under the tongue every 5 (five) minutes as needed for chest pain.     pantoprazole 40 MG tablet  Commonly known as:  PROTONIX  Take 1 tablet (40 mg total) by mouth 2 (two) times daily.     polyethylene glycol packet  Commonly known as:  MIRALAX / GLYCOLAX  Take 17 g by mouth 2 (two) times daily as needed for mild constipation. For constipation     predniSONE 20 MG tablet  Commonly known as:  DELTASONE  Take 40 mg by mouth daily with breakfast.     rosuvastatin 20 MG tablet  Commonly known as:  CRESTOR  Take 1 tablet (20 mg total) by mouth daily.     tiotropium 18 MCG inhalation capsule  Commonly known as:  SPIRIVA HANDIHALER  Place 1 capsule (18 mcg total) into inhaler and inhale daily.     venlafaxine XR 75 MG 24 hr capsule  Commonly known as:  EFFEXOR-XR  Take 75 mg by mouth daily.     vitamin B-12 1000 MCG tablet  Commonly known as:  CYANOCOBALAMIN  Take 1,000 mcg by mouth daily.       Allergies  Allergen Reactions  . Uloric [Febuxostat] Other (See Comments)    Unknown reaction  . Zolpidem Tartrate Other (See Comments)    hallucinations  . Ciprofloxacin Anxiety and Other (See Comments)    Shakiness  . Contrast Media  [Iodinated Diagnostic Agents] Rash  . Tetracycline Rash  . Vicodin [Hydrocodone-Acetaminophen] Anxiety   Follow-up Information   Follow up with HUNG,PATRICK D, MD In 1 week. (hospital follow up)    Specialty:  Gastroenterology   Contact information:   [address] [place] [address] [phone]        The results of  significant diagnostics from this hospitalization (including imaging, microbiology, ancillary and laboratory) are listed below for reference.    Significant Diagnostic Studies: Dg Chest 2 View  10/03/2013   CLINICAL DATA:  Heart palpitations.  EXAM: CHEST  2 VIEW  COMPARISON:  Chest x-ray 11/02/2012.  FINDINGS: Mediastinum and hilar structures are normal. Bibasilar atelectasis and/or scarring again noted. Stable pleural thickening. No focal alveolar infiltrate. Heart size stable. No pulmonary venous congestion. COPD. No acute bony abnormality. Degenerative changes thoracic spine.  IMPRESSION: Stable chest with changes of basilar pleural-parenchymal scarring and atelectasis. No acute abnormality.   Electronically Signed   By: Marcello Moores  Register   On: 10/03/2013 12:58    Microbiology: No results found for this or any previous visit (from the past 240 hour(s)).   Labs: Basic Metabolic Panel:  Recent Labs Lab 10/03/13 1143 10/04/13 0624  NA 140 138  K 3.9 4.0  CL 97 95*  CO2 30 28  GLUCOSE 80 112*  BUN 46* 40*  CREATININE 1.75* 1.81*  CALCIUM 9.0 8.7   Liver Function Tests: No results found for this basename: AST, ALT, ALKPHOS, BILITOT, PROT, ALBUMIN,  in the last 168 hours No results found for this basename: LIPASE, AMYLASE,  in the last 168 hours No results found for this basename: AMMONIA,  in the last 168 hours CBC:  Recent Labs Lab 10/03/13 1143 10/04/13 0624  WBC 12.5* 10.9*  HGB 9.1* 8.5*  HCT 28.6* 26.7*  MCV 100.0 98.9  PLT 147* 140*   Cardiac Enzymes: No results found for this basename: CKTOTAL, CKMB, CKMBINDEX, TROPONINI,   in the last 168 hours BNP: BNP (last 3 results)  Recent Labs  11/01/12 1149 10/03/13 1143  PROBNP 7067.0* 3256.0*   CBG: No results found for this basename: GLUCAP,  in the last 168 hours     Signed:  Charlynne Cousins  [facility] 10/05/2013, 10:31 AM

## 2013-10-05 NOTE — Progress Notes (Signed)
Released.  Verbalized understanding of discharge instructions.  Husband had home oxygen in car for discharge.

## 2013-10-07 ENCOUNTER — Encounter (HOSPITAL_COMMUNITY): Payer: Self-pay | Admitting: Gastroenterology

## 2013-10-07 LAB — GLUCOSE, CAPILLARY: Glucose-Capillary: 77 mg/dL (ref 70–99)

## 2013-10-08 ENCOUNTER — Telehealth: Payer: Self-pay | Admitting: Cardiovascular Disease

## 2013-10-08 NOTE — Telephone Encounter (Signed)
Patient is returning your call. Please call back.  °

## 2013-10-08 NOTE — Telephone Encounter (Signed)
New Prob   Pt has a procedure scheduled for 5/18. Calling to see if stopping Plavis and Aspirin is necessary prior to this procedure. Please call.

## 2013-10-08 NOTE — Telephone Encounter (Signed)
Spoke to patient's husband regarding her question about stopping Plavix and Aspirin prior to her 5/18 procedure for further skin cancer (excision?). She recently had mole excised and she continued to bleed. She was hospitalized from 4/30 to 5/2. Informed them that Dr. Acie Fredrickson is out of the office at this time however the question has been routed to him and once we have his response, we will call her back with his recommendation. Patient's husband verbalized understanding and will await a follow up call.

## 2013-10-08 NOTE — Telephone Encounter (Signed)
LMTCB 10:45 am

## 2013-10-09 NOTE — Telephone Encounter (Signed)
She may hold Plavix for 5 days prior to surgery .  Decrease asa to 81 mg a day if she has not already done so.

## 2013-10-09 NOTE — Telephone Encounter (Signed)
Spoke with patient's husband and advised him that per Dr. Acie Fredrickson, patient may hold Plavix 5 days before surgery.  Husband states patient is already taking ASA 81 mg daily.  I advised husband to call back with additional questions or concerns.  Husband verbalized understanding and agreement.

## 2013-10-21 ENCOUNTER — Telehealth: Payer: Self-pay | Admitting: Hematology and Oncology

## 2013-10-21 NOTE — Telephone Encounter (Signed)
S/W PATIENT HUSBAND AND GAVE NP APPT FOR 06/01 @ 10:45 W/DR. [place] [facility] [place] PACKET MAILED.

## 2013-10-30 ENCOUNTER — Other Ambulatory Visit: Payer: Self-pay | Admitting: Pulmonary Disease

## 2013-11-04 ENCOUNTER — Ambulatory Visit: Payer: Federal, State, Local not specified - PPO

## 2013-11-04 ENCOUNTER — Encounter: Payer: Self-pay | Admitting: Hematology and Oncology

## 2013-11-04 ENCOUNTER — Ambulatory Visit (HOSPITAL_BASED_OUTPATIENT_CLINIC_OR_DEPARTMENT_OTHER): Payer: Federal, State, Local not specified - PPO | Admitting: Hematology and Oncology

## 2013-11-04 VITALS — BP 128/58 | HR 79 | Temp 98.4°F | Resp 20 | Ht 68.0 in | Wt 171.9 lb

## 2013-11-04 DIAGNOSIS — D72829 Elevated white blood cell count, unspecified: Secondary | ICD-10-CM

## 2013-11-04 DIAGNOSIS — D631 Anemia in chronic kidney disease: Secondary | ICD-10-CM

## 2013-11-04 DIAGNOSIS — E242 Drug-induced Cushing's syndrome: Secondary | ICD-10-CM

## 2013-11-04 DIAGNOSIS — F17200 Nicotine dependence, unspecified, uncomplicated: Secondary | ICD-10-CM

## 2013-11-04 DIAGNOSIS — J449 Chronic obstructive pulmonary disease, unspecified: Secondary | ICD-10-CM

## 2013-11-04 DIAGNOSIS — E249 Cushing's syndrome, unspecified: Secondary | ICD-10-CM

## 2013-11-04 DIAGNOSIS — D649 Anemia, unspecified: Secondary | ICD-10-CM

## 2013-11-04 DIAGNOSIS — N289 Disorder of kidney and ureter, unspecified: Secondary | ICD-10-CM

## 2013-11-04 DIAGNOSIS — Z720 Tobacco use: Secondary | ICD-10-CM

## 2013-11-04 DIAGNOSIS — N189 Chronic kidney disease, unspecified: Secondary | ICD-10-CM

## 2013-11-04 DIAGNOSIS — N039 Chronic nephritic syndrome with unspecified morphologic changes: Secondary | ICD-10-CM

## 2013-11-04 NOTE — Progress Notes (Signed)
[facility] NOTE  Patient Care Team: Stephens Shire, MD as PCP - General (Family Medicine) Heath Lark, MD as Consulting Physician (Hematology and Oncology)  CHIEF COMPLAINTS/PURPOSE OF CONSULTATION:  Leukocytosis and chronic anemia  HISTORY OF PRESENTING ILLNESS:  Wanda Yang 73 y.o. female is here because of elevated WBC.  She was found to have abnormal CBC from recent blood work. According to the scan and the report, her blood work dated 10/18/2013 showed a white count of 16.9 with predominant neutrophilia. She also has evidence of anemia. On review of her prior blood work dated to 2009, she has chronic fluctuation of her total white blood cell count from within normal limits to as high as 18.3. Has chronic anemia since 2009. She denies recent infection. The last prescription antibiotics was more than 3 months ago There is not reported symptoms of sinus congestion, cough, urinary frequency/urgency or dysuria, diarrhea or abnormal skin rash.  She had no prior history or diagnosis of cancer. Her age appropriate screening programs are up-to-date. 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 She has been on chronic prednisone therapy for over 2 months. She also has a diagnosis of chronic COPD and has been oxygen dependent for the last 6 years.  The patient is a smoker and currently smokes 1/2 pack of cigarettes per day for the last 56 years. In terms of the anemia, she denies recent chest pain on exertion. She has chronic shortness of breath on minimal exertion. The patient denies any recent signs or symptoms of bleeding such as spontaneous epistaxis, hematuria or hematochezia. She is on antiplatelets agents. She never had colonoscopy. She denies any pica and eats a variety of diet. She never donated blood. She had received blood transfusion before. The patient was prescribed oral iron supplements and she takes 2 iron  supplement per day for many years.   MEDICAL HISTORY:  Past Medical History  Diagnosis Date  . CAD (coronary artery disease)   . Atherosclerosis of abdominal aorta   . On home oxygen therapy     "3L; 24/7" (10/03/2013)  . Tobacco abuse   . HTN (hypertension)   . COPD (chronic obstructive pulmonary disease)   . PVD (peripheral vascular disease)   . Anxiety   . GERD (gastroesophageal reflux disease)   . RBBB (right bundle branch block)   . Tachycardia   . High cholesterol   . Leukocytosis   . CHF (congestive heart failure)   . Shortness of breath   . UTI (urinary tract infection)   . CKD (chronic kidney disease), stage IV   . Temporal arteritis dx'd 08/2013  . Endometrial carcinoma   . Uterine cancer   . Myocardial infarction ~ 2011    " MILD "  . Anemia   . History of blood transfusion     "S/P anemia for 3 years"  . Daily headache     "last few days" (10/03/2013)  . Arthritis     "hands and fingers" (10/03/2013)  . Chronic lower back pain   . Depression     "sometimes; not often" (10/03/2013)  . Lower GI bleeding 02/2012; 10/03/2013    SURGICAL HISTORY: Past Surgical History  Procedure Laterality Date  . Carotid stent    . Tonsillectomy    . Forearm fracture surgery Left ~ 2005    PLATES AND SCREWS  . Femoral artery stent    . Reduction mammaplasty    .  Wrist fracture surgery Left ~ 2005    "it was crushed; they glued it together"  . Coronary angioplasty with stent placement      "1 + +1"  . Iliac artery stent Bilateral   . Esophagogastroduodenoscopy N/A 10/04/2013    Procedure: ESOPHAGOGASTRODUODENOSCOPY (EGD);  Surgeon: Beryle Beams, MD;  Location: [facility] ENDOSCOPY;  Service: Endoscopy;  Laterality: N/A;  . Vaginal hysterectomy      for cancer    SOCIAL HISTORY: History   Social History  . Marital Status: Married    Spouse Name: N/A    Number of Children: N/A  . Years of Education: N/A   Occupational History  . Not on file.   Social History Main Topics   . Smoking status: Current Every Day Smoker -- 0.50 packs/day for 56 years    Types: Cigarettes  . Smokeless tobacco: Never Used  . Alcohol Use: Yes     Comment: 10/03/2013 "I drink 2-3 times/yr"  . Drug Use: No  . Sexual Activity: No   Other Topics Concern  . Not on file   Social History Narrative  . No narrative on file    FAMILY HISTORY: Family History  Problem Relation Age of Onset  . Heart attack Father   . Coronary artery disease Father   . Leukemia Mother   . Leukemia Brother     ALLERGIES:  is allergic to uloric; zolpidem tartrate; ciprofloxacin; contrast media; tetracycline; and vicodin.  MEDICATIONS:  Current Outpatient Prescriptions  Medication Sig Dispense Refill  . allopurinol (ZYLOPRIM) 100 MG tablet Take 100 mg by mouth 2 (two) times daily.        Marland Kitchen arformoterol (BROVANA) 15 MCG/2ML NEBU Take 15 mcg by nebulization daily.      Marland Kitchen aspirin 81 MG tablet Take 1 tablet (81 mg total) by mouth daily.  30 tablet    . budesonide (PULMICORT) 0.5 MG/2ML nebulizer solution Take 0.5 mg by nebulization daily.      . clopidogrel (PLAVIX) 75 MG tablet Take 1 tablet (75 mg total) by mouth daily.  90 tablet  3  . diltiazem (DILACOR XR) 240 MG 24 hr capsule Take 1 capsule (240 mg total) by mouth daily.  90 capsule  3  . famotidine (PEPCID) 20 MG tablet Take 20 mg by mouth 2 (two) times daily as needed for heartburn.       . ferrous sulfate 325 (65 FE) MG tablet Take 1 tablet (325 mg total) by mouth daily with breakfast.  90 tablet  3  . furosemide (LASIX) 40 MG tablet Take 40 mg by mouth daily. For weight gain      . nebivolol (BYSTOLIC) 5 MG tablet Take 5 mg by mouth daily.      . nitroGLYCERIN (NITROSTAT) 0.4 MG SL tablet Place 0.4 mg under the tongue every 5 (five) minutes as needed for chest pain.      . pantoprazole (PROTONIX) 40 MG tablet Take 1 tablet (40 mg total) by mouth 2 (two) times daily.  60 tablet  4  . predniSONE (DELTASONE) 20 MG tablet Take 40 mg by mouth daily  with breakfast.       . PROAIR HFA 108 (90 BASE) MCG/ACT inhaler INHALE 2 PUFFS INTO THE LUNGS EVERY 4 HOURS AS NEEDED.  8.5 g  0  . Probiotic Product (ALIGN PO) Take by mouth daily.      . rosuvastatin (CRESTOR) 20 MG tablet Take 1 tablet (20 mg total) by mouth daily.  90 tablet  3  . tiotropium (SPIRIVA HANDIHALER) 18 MCG inhalation capsule Place 1 capsule (18 mcg total) into inhaler and inhale daily.  90 capsule  3  . venlafaxine XR (EFFEXOR-XR) 75 MG 24 hr capsule Take 75 mg by mouth daily.       . vitamin B-12 (CYANOCOBALAMIN) 1000 MCG tablet Take 1,000 mcg by mouth daily.       No current facility-administered medications for this visit.    REVIEW OF SYSTEMS:   Constitutional: Denies fevers, chills or abnormal night sweats Eyes: Denies blurriness of vision, double vision or watery eyes Ears, nose, mouth, throat, and face: Denies mucositis or sore throat Gastrointestinal:  Denies nausea, heartburn or change in bowel habits Skin: Denies abnormal skin rashes Lymphatics: Denies new lymphadenopathy Neurological:Denies numbness, tingling or new weaknesses Behavioral/Psych: Mood is stable, no new changes  All other systems were reviewed with the patient and are negative.  PHYSICAL EXAMINATION: ECOG PERFORMANCE STATUS: 2 - Symptomatic, <50% confined to bed  Filed Vitals:   11/04/13 1059  BP: 128/58  Pulse: 79  Temp: 98.4 F (36.9 C)  Resp: 20   Filed Weights   11/04/13 1059  Weight: 171 lb 14.4 oz (77.973 kg)    GENERAL:alert, no distress and comfortable. She looks older than her stated age. She has oxygen delivered via nasal cannula. Appears cushingoid. SKIN: She has evidence of central cyanosis. Multiple skin bruises were noted. No petechiae rash. EYES: normal, conjunctiva are pink and non-injected, sclera clear OROPHARYNX:no exudate, no erythema and lips, buccal mucosa, and tongue normal . Poor dentition is noted NECK: Significant fat redistribution, consistent with  cushingoid features, thyroid normal size, non-tender, without nodularity LYMPH:  no palpable lymphadenopathy in the cervical, axillary or inguinal LUNGS: clear to auscultation and percussion with normal breathing effort HEART: regular rate & rhythm and no murmurs with moderate bilateral lower extremity edema ABDOMEN:abdomen soft, non-tender and normal bowel sounds Musculoskeletal:no cyanosis of digits and no clubbing  PSYCH: alert & oriented x 3 with fluent speech NEURO: no focal motor/sensory deficits  LABORATORY DATA:  I have reviewed the data as listed Recent Results (from the past 2160 hour(s))  CBC     Status: Abnormal   Collection Time    10/03/13 11:43 AM      Result Value Ref Range   WBC 12.5 (*) 4.0 - 10.5 K/uL   RBC 2.86 (*) 3.87 - 5.11 MIL/uL   Hemoglobin 9.1 (*) 12.0 - 15.0 g/dL   HCT 28.6 (*) 36.0 - 46.0 %   MCV 100.0  78.0 - 100.0 fL   MCH 31.8  26.0 - 34.0 pg   MCHC 31.8  30.0 - 36.0 g/dL   RDW 15.7 (*) 11.5 - 15.5 %   Platelets 147 (*) 150 - 400 K/uL  BASIC METABOLIC PANEL     Status: Abnormal   Collection Time    10/03/13 11:43 AM      Result Value Ref Range   Sodium 140  137 - 147 mEq/L   Potassium 3.9  3.7 - 5.3 mEq/L   Chloride 97  96 - 112 mEq/L   CO2 30  19 - 32 mEq/L   Glucose, Bld 80  70 - 99 mg/dL   BUN 46 (*) 6 - 23 mg/dL   Creatinine, Ser 1.75 (*) 0.50 - 1.10 mg/dL   Calcium 9.0  8.4 - 10.5 mg/dL   GFR calc non Af Amer 28 (*) >90 mL/min   GFR calc Af Amer 32 (*) >90 mL/min  Comment: (NOTE)     The eGFR has been calculated using the CKD EPI equation.     This calculation has not been validated in all clinical situations.     eGFR's persistently <90 mL/min signify possible Chronic Kidney     Disease.  PRO B NATRIURETIC PEPTIDE     Status: Abnormal   Collection Time    10/03/13 11:43 AM      Result Value Ref Range   Pro B Natriuretic peptide (BNP) 3256.0 (*) 0 - 125 pg/mL  GLUCOSE, CAPILLARY     Status: None   Collection Time    10/03/13  11:58 AM      Result Value Ref Range   Glucose-Capillary 77  70 - 99 mg/dL  I-STAT TROPOININ, ED     Status: None   Collection Time    10/03/13  1:05 PM      Result Value Ref Range   Troponin i, poc 0.06  0.00 - 0.08 ng/mL   Comment 3            Comment: Due to the release kinetics of cTnI,     a negative result within the first hours     of the onset of symptoms does not rule out     myocardial infarction with certainty.     If myocardial infarction is still suspected,     repeat the test at appropriate intervals.  POC OCCULT BLOOD, ED     Status: Abnormal   Collection Time    10/03/13  1:25 PM      Result Value Ref Range   Fecal Occult Bld POSITIVE (*) NEGATIVE  TYPE AND SCREEN     Status: None   Collection Time    10/03/13  3:05 PM      Result Value Ref Range   ABO/RH(D) A NEG     Antibody Screen NEG     Sample Expiration 10/06/2013    TSH     Status: None   Collection Time    10/03/13  6:40 PM      Result Value Ref Range   TSH 3.470  0.350 - 4.500 uIU/mL   Comment: Please note change in reference range.  CBC     Status: Abnormal   Collection Time    10/04/13  6:24 AM      Result Value Ref Range   WBC 10.9 (*) 4.0 - 10.5 K/uL   RBC 2.70 (*) 3.87 - 5.11 MIL/uL   Hemoglobin 8.5 (*) 12.0 - 15.0 g/dL   HCT 26.7 (*) 36.0 - 46.0 %   MCV 98.9  78.0 - 100.0 fL   MCH 31.5  26.0 - 34.0 pg   MCHC 31.8  30.0 - 36.0 g/dL   RDW 15.9 (*) 11.5 - 15.5 %   Platelets 140 (*) 150 - 400 K/uL  BASIC METABOLIC PANEL     Status: Abnormal   Collection Time    10/04/13  6:24 AM      Result Value Ref Range   Sodium 138  137 - 147 mEq/L   Potassium 4.0  3.7 - 5.3 mEq/L   Chloride 95 (*) 96 - 112 mEq/L   CO2 28  19 - 32 mEq/L   Glucose, Bld 112 (*) 70 - 99 mg/dL   BUN 40 (*) 6 - 23 mg/dL   Creatinine, Ser 1.81 (*) 0.50 - 1.10 mg/dL   Calcium 8.7  8.4 - 10.5 mg/dL   GFR calc non Af Wyvonnia Lora  27 (*) >90 mL/min   GFR calc Af Amer 31 (*) >90 mL/min   Comment: (NOTE)     The eGFR has been  calculated using the CKD EPI equation.     This calculation has not been validated in all clinical situations.     eGFR's persistently <90 mL/min signify possible Chronic Kidney     Disease.   ASSESSMENT & PLAN #1 Leukocytosis This is related to chronic prednisone therapy, chronic tobacco abuse and possible bronchitis related to COPD. Further workup is unnecessary. I suspect a repeat blood count will never be normal unless the patient is tapered off prednisone and she stops smoking completely. The fact that some of her prior blood works normalized suggested this is likely reactive in nature. #2 anemia #3 chronic kidney disease This is likely anemia of chronic disease. The patient denies recent history of bleeding such as epistaxis, hematuria or hematochezia. She is asymptomatic from the anemia. We will observe for now.  She does not require transfusion now.  I recommend consideration for erythropoietin stimulating agents as directed by her nephrologist. #4 cushingoid's appearance Patient is likely dependent on prednisone at this point in time. I recommend a slow taper course as directed by her rheumatologists. #5 COPD #6 emphysema #7 chronic tobacco abuse I spent some time counseling the patient the importance of tobacco cessation. she is currently attempting to quit on her own

## 2013-11-04 NOTE — Progress Notes (Signed)
Checked in new patient with no financial issues and she has not seen the dr yet. She has not been out of the country and she has the appt card.

## 2013-11-12 ENCOUNTER — Encounter (HOSPITAL_COMMUNITY)
Admission: RE | Admit: 2013-11-12 | Discharge: 2013-11-12 | Disposition: A | Discharge status: Home / Self Care | Payer: Federal, State, Local not specified - PPO | Source: Ambulatory Visit | Attending: Nephrology | Admitting: Nephrology

## 2013-11-12 DIAGNOSIS — D509 Iron deficiency anemia, unspecified: Secondary | ICD-10-CM | POA: Diagnosis present

## 2013-11-12 DIAGNOSIS — N184 Chronic kidney disease, stage 4 (severe): Secondary | ICD-10-CM | POA: Diagnosis not present

## 2013-11-12 LAB — POCT HEMOGLOBIN-HEMACUE: HEMOGLOBIN: 7.5 g/dL — AB (ref 12.0–15.0)

## 2013-11-12 MED ORDER — EPOETIN ALFA 20000 UNIT/ML IJ SOLN
INTRAMUSCULAR | Status: AC
  Administered 2013-11-12: 20000 [IU]
  Filled 2013-11-12: qty 1

## 2013-11-12 MED ORDER — SODIUM CHLORIDE 0.9 % IV SOLN
1020.0000 mg | Freq: Once | INTRAVENOUS | Status: AC
  Administered 2013-11-12: 1020 mg via INTRAVENOUS
  Filled 2013-11-12: qty 34

## 2013-11-12 MED ORDER — EPOETIN ALFA 40000 UNIT/ML IJ SOLN: 30000.0000 [IU] | INTRAMUSCULAR | Status: DC

## 2013-11-12 MED ORDER — EPOETIN ALFA 10000 UNIT/ML IJ SOLN
INTRAMUSCULAR | Status: AC
  Administered 2013-11-12: 10000 [IU]
  Filled 2013-11-12: qty 1

## 2013-11-12 NOTE — Discharge Instructions (Signed)
Ferumoxytol injection °What is this medicine? °FERUMOXYTOL is an iron complex. Iron is used to make healthy red blood cells, which carry oxygen and nutrients throughout the body. This medicine is used to treat iron deficiency anemia in people with chronic kidney disease. °This medicine may be used for other purposes; ask your health care provider or pharmacist if you have questions. °COMMON BRAND NAME(S): Feraheme  °What should I tell my health care provider before I take this medicine? °They need to know if you have any of these conditions: °-anemia not caused by low iron levels °-high levels of iron in the blood °-magnetic resonance imaging (MRI) test scheduled °-an unusual or allergic reaction to iron, other medicines, foods, dyes, or preservatives °-pregnant or trying to get pregnant °-breast-feeding °How should I use this medicine? °This medicine is for injection into a vein. It is given by a health care professional in a hospital or clinic setting. °Talk to your pediatrician regarding the use of this medicine in children. Special care may be needed. °Overdosage: If you think you've taken too much of this medicine contact a poison control center or emergency room at once. °Overdosage: If you think you have taken too much of this medicine contact a poison control center or emergency room at once. °NOTE: This medicine is only for you. Do not share this medicine with others. °What if I miss a dose? °It is important not to miss your dose. Call your doctor or health care professional if you are unable to keep an appointment. °What may interact with this medicine? °This medicine may interact with the following medications: °-other iron products °This list may not describe all possible interactions. Give your health care provider a list of all the medicines, herbs, non-prescription drugs, or dietary supplements you use. Also tell them if you smoke, drink alcohol, or use illegal drugs. Some items may interact with your  medicine. °What should I watch for while using this medicine? °Visit your doctor or healthcare professional regularly. Tell your doctor or healthcare professional if your symptoms do not start to get better or if they get worse. You may need blood work done while you are taking this medicine. °You may need to follow a special diet. Talk to your doctor. Foods that contain iron include: whole grains/cereals, dried fruits, beans, or peas, leafy green vegetables, and organ meats (liver, kidney). °What side effects may I notice from receiving this medicine? °Side effects that you should report to your doctor or health care professional as soon as possible: °-allergic reactions like skin rash, itching or hives, swelling of the face, lips, or tongue °-breathing problems °-changes in blood pressure °-feeling faint or lightheaded, falls °-fever or chills °-flushing, sweating, or hot feelings °-swelling of the ankles or feet °Side effects that usually do not require medical attention (Report these to your doctor or health care professional if they continue or are bothersome.): °-diarrhea °-headache °-nausea, vomiting °-stomach pain °This list may not describe all possible side effects. Call your doctor for medical advice about side effects. You may report side effects to FDA at 1-800-FDA-1088. °Where should I keep my medicine? °This drug is given in a hospital or clinic and will not be stored at home. °NOTE: This sheet is a summary. It may not cover all possible information. If you have questions about this medicine, talk to your doctor, pharmacist, or health care provider. °© 2014, [employer]. (2012-01-06 15:23:36) °Epoetin Alfa injection °What is this medicine? °EPOETIN ALFA (e POE e tin   fa) helps your body make more red blood cells. This medicine is used to treat anemia caused by chronic kidney failure, cancer chemotherapy, or HIV-therapy. It may also be used before surgery if you have anemia. °This medicine  may be used for other purposes; ask your health care provider or pharmacist if you have questions. °COMMON BRAND NAME(S): Epogen, Procrit °What should I tell my health care provider before I take this medicine? °They need to know if you have any of these conditions: °-blood clotting disorders °-cancer patient not on chemotherapy °-cystic fibrosis °-heart disease, such as angina or heart failure °-hemoglobin level of 12 g/dL or greater °-high blood pressure °-low levels of folate, iron, or vitamin B12 °-seizures °-an unusual or allergic reaction to erythropoietin, albumin, benzyl alcohol, hamster proteins, other medicines, foods, dyes, or preservatives °-pregnant or trying to get pregnant °-breast-feeding °How should I use this medicine? °This medicine is for injection into a vein or under the skin. It is usually given by a health care professional in a hospital or clinic setting. °If you get this medicine at home, you will be taught how to prepare and give this medicine. Use exactly as directed. Take your medicine at regular intervals. Do not take your medicine more often than directed. °It is important that you put your used needles and syringes in a special sharps container. Do not put them in a trash can. If you do not have a sharps container, call your pharmacist or healthcare provider to get one. °Talk to your pediatrician regarding the use of this medicine in children. While this drug may be prescribed for selected conditions, precautions do apply. °Overdosage: If you think you have taken too much of this medicine contact a poison control center or emergency room at once. °NOTE: This medicine is only for you. Do not share this medicine with others. °What if I miss a dose? °If you miss a dose, take it as soon as you can. If it is almost time for your next dose, take only that dose. Do not take double or extra doses. °What may interact with this medicine? °Do not take this medicine with any of the following  medications: °-darbepoetin alfa °This list may not describe all possible interactions. Give your health care provider a list of all the medicines, herbs, non-prescription drugs, or dietary supplements you use. Also tell them if you smoke, drink alcohol, or use illegal drugs. Some items may interact with your medicine. °What should I watch for while using this medicine? °Visit your prescriber or health care professional for regular checks on your progress and for the needed blood tests and blood pressure measurements. It is especially important for the doctor to make sure your hemoglobin level is in the desired range, to limit the risk of potential side effects and to give you the best benefit. Keep all appointments for any recommended tests. Check your blood pressure as directed. Ask your doctor what your blood pressure should be and when you should contact him or her. °As your body makes more red blood cells, you may need to take iron, folic acid, or vitamin B supplements. Ask your doctor or health care provider which products are right for you. If you have kidney disease continue dietary restrictions, even though this medication can make you feel better. Talk with your doctor or health care professional about the foods you eat and the vitamins that you take. °What side effects may I notice from receiving this medicine? °Side effects that you   you should report to your doctor or health care professional as soon as possible: °-allergic reactions like skin rash, itching or hives, swelling of the face, lips, or tongue °-breathing problems °-changes in vision °-chest pain °-confusion, trouble speaking or understanding °-feeling faint or lightheaded, falls °-high blood pressure °-muscle aches or pains °-pain, swelling, warmth in the leg °-rapid weight gain °-severe headaches °-sudden numbness or weakness of the face, arm or leg °-trouble walking, dizziness, loss of balance or coordination °-seizures (convulsions) °-swelling  of the ankles, feet, hands °-unusually weak or tired °Side effects that usually do not require medical attention (report to your doctor or health care professional if they continue or are bothersome): °-diarrhea °-fever, chills (flu-like symptoms) °-headaches °-nausea, vomiting °-redness, stinging, or swelling at site where injected °This list may not describe all possible side effects. Call your doctor for medical advice about side effects. You may report side effects to FDA at 1-800-FDA-1088. °Where should I keep my medicine? °Keep out of the reach of children. °Store in a refrigerator between 2 and 8 degrees C (36 and 46 degrees F). Do not freeze or shake. Throw away any unused portion if using a single-dose vial. Multi-dose vials can be kept in the refrigerator for up to 21 days after the initial dose. Throw away unused medicine. °NOTE: This sheet is a summary. It may not cover all possible information. If you have questions about this medicine, talk to your doctor, pharmacist, or health care provider. °© 2014, [employer]. (2008-05-06 10:25:44) ° °

## 2013-11-13 ENCOUNTER — Encounter (HOSPITAL_COMMUNITY): Payer: Federal, State, Local not specified - PPO

## 2013-11-14 ENCOUNTER — Other Ambulatory Visit: Payer: Self-pay | Admitting: Pulmonary Disease

## 2013-11-15 ENCOUNTER — Other Ambulatory Visit: Payer: Self-pay | Admitting: Pulmonary Disease

## 2013-11-26 ENCOUNTER — Encounter (HOSPITAL_COMMUNITY)
Admission: RE | Admit: 2013-11-26 | Discharge: 2013-11-26 | Disposition: A | Discharge status: Home / Self Care | Payer: Federal, State, Local not specified - PPO | Source: Ambulatory Visit | Attending: Nephrology | Admitting: Nephrology

## 2013-11-26 DIAGNOSIS — D509 Iron deficiency anemia, unspecified: Secondary | ICD-10-CM | POA: Diagnosis not present

## 2013-11-26 LAB — POCT HEMOGLOBIN-HEMACUE: Hemoglobin: 10.1 g/dL — ABNORMAL LOW (ref 12.0–15.0)

## 2013-11-26 MED ORDER — EPOETIN ALFA 10000 UNIT/ML IJ SOLN
INTRAMUSCULAR | Status: AC
  Administered 2013-11-26: 10000 [IU]
  Filled 2013-11-26: qty 1

## 2013-11-26 MED ORDER — EPOETIN ALFA 40000 UNIT/ML IJ SOLN: 30000.0000 [IU] | INTRAMUSCULAR | Status: DC

## 2013-11-26 MED ORDER — EPOETIN ALFA 20000 UNIT/ML IJ SOLN
INTRAMUSCULAR | Status: AC
  Administered 2013-11-26: 20000 [IU]
  Filled 2013-11-26: qty 1

## 2013-12-10 ENCOUNTER — Encounter (HOSPITAL_COMMUNITY)
Admission: RE | Admit: 2013-12-10 | Discharge: 2013-12-10 | Disposition: A | Discharge status: Home / Self Care | Payer: Federal, State, Local not specified - PPO | Source: Ambulatory Visit | Attending: Nephrology | Admitting: Nephrology

## 2013-12-10 DIAGNOSIS — N184 Chronic kidney disease, stage 4 (severe): Secondary | ICD-10-CM | POA: Insufficient documentation

## 2013-12-10 DIAGNOSIS — D509 Iron deficiency anemia, unspecified: Secondary | ICD-10-CM | POA: Diagnosis present

## 2013-12-10 LAB — IRON AND TIBC
Iron: 68 ug/dL (ref 42–135)
Saturation Ratios: 22 % (ref 20–55)
TIBC: 305 ug/dL (ref 250–470)
UIBC: 237 ug/dL (ref 125–400)

## 2013-12-10 LAB — POCT HEMOGLOBIN-HEMACUE: Hemoglobin: 9.7 g/dL — ABNORMAL LOW (ref 12.0–15.0)

## 2013-12-10 MED ORDER — EPOETIN ALFA 10000 UNIT/ML IJ SOLN
INTRAMUSCULAR | Status: AC
  Administered 2013-12-10: 10000 [IU] via SUBCUTANEOUS
  Filled 2013-12-10: qty 1

## 2013-12-10 MED ORDER — EPOETIN ALFA 20000 UNIT/ML IJ SOLN
INTRAMUSCULAR | Status: AC
  Administered 2013-12-10: 20000 [IU] via SUBCUTANEOUS
  Filled 2013-12-10: qty 1

## 2013-12-10 MED ORDER — EPOETIN ALFA 40000 UNIT/ML IJ SOLN: 30000.0000 [IU] | INTRAMUSCULAR | Status: DC

## 2013-12-11 LAB — FERRITIN: FERRITIN: 210 ng/mL (ref 10–291)

## 2013-12-24 ENCOUNTER — Encounter (HOSPITAL_COMMUNITY): Payer: Federal, State, Local not specified - PPO

## 2013-12-24 ENCOUNTER — Other Ambulatory Visit (HOSPITAL_COMMUNITY): Payer: Self-pay | Admitting: *Deleted

## 2013-12-25 ENCOUNTER — Encounter (HOSPITAL_COMMUNITY)
Admission: RE | Admit: 2013-12-25 | Discharge: 2013-12-25 | Disposition: A | Discharge status: Home / Self Care | Payer: Federal, State, Local not specified - PPO | Source: Ambulatory Visit | Attending: Nephrology | Admitting: Nephrology

## 2013-12-25 DIAGNOSIS — D509 Iron deficiency anemia, unspecified: Secondary | ICD-10-CM | POA: Diagnosis not present

## 2013-12-25 LAB — POCT HEMOGLOBIN-HEMACUE: HEMOGLOBIN: 10.6 g/dL — AB (ref 12.0–15.0)

## 2013-12-25 MED ORDER — EPOETIN ALFA 40000 UNIT/ML IJ SOLN: 30000.0000 [IU] | INTRAMUSCULAR | Status: DC

## 2013-12-25 MED ORDER — EPOETIN ALFA 10000 UNIT/ML IJ SOLN
INTRAMUSCULAR | Status: AC
  Administered 2013-12-25: 10000 [IU]
  Filled 2013-12-25: qty 1

## 2013-12-25 MED ORDER — EPOETIN ALFA 20000 UNIT/ML IJ SOLN
INTRAMUSCULAR | Status: AC
  Administered 2013-12-25: 20000 [IU]
  Filled 2013-12-25: qty 1

## 2013-12-25 MED ORDER — SODIUM CHLORIDE 0.9 % IV SOLN
1020.0000 mg | Freq: Once | INTRAVENOUS | Status: AC
  Administered 2013-12-25: 1020 mg via INTRAVENOUS
  Filled 2013-12-25: qty 34

## 2014-01-07 ENCOUNTER — Other Ambulatory Visit (HOSPITAL_COMMUNITY): Payer: Self-pay | Admitting: *Deleted

## 2014-01-08 ENCOUNTER — Encounter (HOSPITAL_COMMUNITY)
Admission: RE | Admit: 2014-01-08 | Discharge: 2014-01-08 | Disposition: A | Discharge status: Home / Self Care | Payer: Federal, State, Local not specified - PPO | Source: Ambulatory Visit | Attending: Nephrology | Admitting: Nephrology

## 2014-01-08 DIAGNOSIS — N184 Chronic kidney disease, stage 4 (severe): Secondary | ICD-10-CM | POA: Diagnosis not present

## 2014-01-08 DIAGNOSIS — D509 Iron deficiency anemia, unspecified: Secondary | ICD-10-CM | POA: Diagnosis present

## 2014-01-08 LAB — POCT HEMOGLOBIN-HEMACUE: Hemoglobin: 11.3 g/dL — ABNORMAL LOW (ref 12.0–15.0)

## 2014-01-08 MED ORDER — EPOETIN ALFA 10000 UNIT/ML IJ SOLN
INTRAMUSCULAR | Status: AC
  Administered 2014-01-08: 10000 [IU] via SUBCUTANEOUS
  Filled 2014-01-08: qty 1

## 2014-01-08 MED ORDER — EPOETIN ALFA 40000 UNIT/ML IJ SOLN: 30000.0000 [IU] | INTRAMUSCULAR | Status: DC

## 2014-01-08 MED ORDER — EPOETIN ALFA 20000 UNIT/ML IJ SOLN
INTRAMUSCULAR | Status: AC
  Administered 2014-01-08: 20000 [IU] via SUBCUTANEOUS
  Filled 2014-01-08: qty 1

## 2014-01-20 ENCOUNTER — Ambulatory Visit (INDEPENDENT_AMBULATORY_CARE_PROVIDER_SITE_OTHER): Payer: Federal, State, Local not specified - PPO | Admitting: Cardiovascular Disease

## 2014-01-20 ENCOUNTER — Encounter: Payer: Self-pay | Admitting: Cardiovascular Disease

## 2014-01-20 ENCOUNTER — Other Ambulatory Visit: Payer: Federal, State, Local not specified - PPO

## 2014-01-20 VITALS — BP 110/78 | HR 85 | Ht 68.0 in | Wt 159.1 lb

## 2014-01-20 DIAGNOSIS — I2510 Atherosclerotic heart disease of native coronary artery without angina pectoris: Secondary | ICD-10-CM

## 2014-01-20 DIAGNOSIS — E785 Hyperlipidemia, unspecified: Secondary | ICD-10-CM

## 2014-01-20 LAB — BASIC METABOLIC PANEL
BUN: 35 mg/dL — ABNORMAL HIGH (ref 6–23)
CALCIUM: 9.3 mg/dL (ref 8.4–10.5)
CO2: 34 mEq/L — ABNORMAL HIGH (ref 19–32)
Chloride: 94 mEq/L — ABNORMAL LOW (ref 96–112)
Creatinine, Ser: 2 mg/dL — ABNORMAL HIGH (ref 0.4–1.2)
GFR: 25.83 mL/min — AB (ref >60.00)
GLUCOSE: 121 mg/dL — AB (ref 70–99)
Potassium: 3.4 mEq/L — ABNORMAL LOW (ref 3.5–5.1)
Sodium: 140 mEq/L (ref 135–145)

## 2014-01-20 LAB — LIPID PANEL
CHOLESTEROL: 150 mg/dL (ref 0–200)
HDL: 65.5 mg/dL (ref >39.00)
LDL Cholesterol: 50 mg/dL (ref 0–99)
NONHDL: 84.5
Total CHOL/HDL Ratio: 2
Triglycerides: 175 mg/dL — ABNORMAL HIGH (ref 0.0–149.0)
VLDL: 35 mg/dL (ref 0.0–40.0)

## 2014-01-20 LAB — HEPATIC FUNCTION PANEL
ALK PHOS: 70 U/L (ref 39–117)
ALT: 12 U/L (ref 0–35)
AST: 18 U/L (ref 0–37)
Albumin: 3.4 g/dL — ABNORMAL LOW (ref 3.5–5.2)
Bilirubin, Direct: 0 mg/dL (ref 0.0–0.3)
Total Bilirubin: 0.4 mg/dL (ref 0.2–1.2)
Total Protein: 6.6 g/dL (ref 6.0–8.3)

## 2014-01-20 NOTE — Assessment & Plan Note (Signed)
Wanda Yang is about the same.  She has some mild CP when she transfers from wheelchair to bed but it does not last long.   She continues to smoke and has developed many other medial issues recently - temporal arteritis - CKD- I think that her prognosis is not good and I do not think we need to do further evaluation of these brief episodes of CP. I have asked her to try to stop smoking and she is resistant.   See her again in 6 months for followup visit.

## 2014-01-20 NOTE — Addendum Note (Signed)
Addended by: Lily Kocher on: 01/20/2014 11:42 AM   Modules accepted: Orders

## 2014-01-20 NOTE — Patient Instructions (Signed)
Your physician recommends that you continue on your current medications as directed. Please refer to the Current Medication list given to you today.  Your physician recommends that you go to the lab today for BMET, Lipid and Hepatic panel  Your physician wants you to follow-up in: 6 months with Dr Vilinda Boehringer will receive a reminder letter in the mail two months in advance. If you don't receive a letter, please call our office to schedule the follow-up appointment.

## 2014-01-20 NOTE — Progress Notes (Signed)
Wanda Yang  Date of Birth  12-May-1941 [facility] [address]    [place], [place]  [address] [phone]  Fax  [phone]  Problem: 1. Coronary artery disease-status post PTCA and stenting of her left complex artery as well as right coronary artery 2. Abdominal aortic ulceration/atherosclerosis 3. Continued cigarette use-even while using home oxygen 4. Hypertension 5. COPD 6. Gout- becomes dyspneic with allopurinol 7. PVD,  S/p iliac stenting. 7. Temporal Arteritis   History of Present Illness:  73 yo female with hx of CAD - s/p PCI.  She has a significant history of COPD.  she continues to smoke. She quit for about 4 weeks but unfortunately started back.  Has a history of hypertension. Her blood pressure readings have been low separately.   She has a history of hyperlipidemia. Her medical doctor increased her Crestor to 40 mg a day at her last visit.  She is having some gout pain in her feet but the crestor did not seem to worsen these pains.  She complains of leg fatigue. She does not have any claudication.   She also complains of burning in both her feet and toes.  She also has had some cyanosis in her toes.  She's been wearing compression hose and this seems to help herdecrease the swelling and cyanosis in her feet and toes.  She also has a history of an abdominal aortic ulceration.  She continues to have significant dyspnea. She is very short of breath when she wakes up in the morning that she gradually feels better and is able to catch her breath.  She's been taking some allopurinol but apparently this makes her even more short of breath.  October 02, 2012: Wanda Yang is having some chest tightness.  She has trouble breathing on rainy days.  The discomfort is not similar to her angina and she has not taken a NTG.    She decreased her crestor back down to to 20 mg a day because of arm weakness and soreness.    Feb. 9, 2015:  She has brought a  handicap parking placard application with her today.  She continues to get more and more fatigued.   No CP.    January 20, 2014:  Wanda Yang has been diagnosed with Temporal Arteritis.  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 She denies any significant angina.  Has mild cp when she is going to bed.  Not typically at other times.  She is still smoking .  Current Outpatient Prescriptions on File Prior to Visit  Medication Sig Dispense Refill  . allopurinol (ZYLOPRIM) 100 MG tablet Take 100 mg by mouth 2 (two) times daily.        Marland Kitchen arformoterol (BROVANA) 15 MCG/2ML NEBU Take 15 mcg by nebulization daily.      Marland Kitchen aspirin 81 MG tablet Take 1 tablet (81 mg total) by mouth daily.  30 tablet    . budesonide (PULMICORT) 0.5 MG/2ML nebulizer solution Take 0.5 mg by nebulization daily.      . clopidogrel (PLAVIX) 75 MG tablet Take 1 tablet (75 mg total) by mouth daily.  90 tablet  3  . diltiazem (DILACOR XR) 240 MG 24 hr capsule Take 1 capsule (240 mg total) by mouth daily.  90 capsule  3  . famotidine (PEPCID) 20 MG tablet Take 20 mg by mouth 2 (two) times daily as needed for heartburn.       Marland Kitchen  ferrous sulfate 325 (65 FE) MG tablet Take 1 tablet (325 mg total) by mouth daily with breakfast.  90 tablet  3  . furosemide (LASIX) 40 MG tablet Take 40 mg by mouth daily. 160 MG in morning and 80 MG in the evening-----For weight gain      . nebivolol (BYSTOLIC) 5 MG tablet Take 5 mg by mouth daily.      . nitroGLYCERIN (NITROSTAT) 0.4 MG SL tablet Place 0.4 mg under the tongue every 5 (five) minutes as needed for chest pain.      . pantoprazole (PROTONIX) 40 MG tablet Take 1 tablet (40 mg total) by mouth 2 (two) times daily.  60 tablet  4  . predniSONE (DELTASONE) 20 MG tablet Take 15 mg by mouth daily with breakfast.       . PROAIR HFA 108 (90 BASE) MCG/ACT inhaler INHALE 2 PUFFS INTO THE LUNGS EVERY 4 HOURS AS NEEDED.  8.5 g  3  . Probiotic Product (ALIGN PO) Take by mouth daily.        . rosuvastatin (CRESTOR) 20 MG tablet Take 1 tablet (20 mg total) by mouth daily.  90 tablet  3  . SPIRIVA HANDIHALER 18 MCG inhalation capsule INHALE THE CONTENTS OF 1   CAPSULE DAILY VIA          HANDIHALER  90 capsule  3  . traMADol (ULTRAM) 50 MG tablet Take 50 mg by mouth every 6 (six) hours as needed.      . venlafaxine XR (EFFEXOR-XR) 75 MG 24 hr capsule Take 75 mg by mouth daily.       . vitamin B-12 (CYANOCOBALAMIN) 1000 MCG tablet Take 1,000 mcg by mouth daily.       No current facility-administered medications on file prior to visit.    Allergies  Allergen Reactions  . Uloric [Febuxostat] Other (See Comments)    Unknown reaction  . Zolpidem Tartrate Other (See Comments)    hallucinations  . Ciprofloxacin Anxiety and Other (See Comments)    Shakiness  . Contrast Media [Iodinated Diagnostic Agents] Rash  . Tetracycline Rash  . Vicodin [Hydrocodone-Acetaminophen] Anxiety    Past Medical History  Diagnosis Date  . CAD (coronary artery disease)   . Atherosclerosis of abdominal aorta   . On home oxygen therapy     "3L; 24/7" (10/03/2013)  . Tobacco abuse   . HTN (hypertension)   . COPD (chronic obstructive pulmonary disease)   . PVD (peripheral vascular disease)   . Anxiety   . GERD (gastroesophageal reflux disease)   . RBBB (right bundle branch block)   . Tachycardia   . High cholesterol   . Leukocytosis   . CHF (congestive heart failure)   . Shortness of breath   . UTI (urinary tract infection)   . CKD (chronic kidney disease), stage IV   . Temporal arteritis dx'd 08/2013  . Endometrial carcinoma   . Uterine cancer   . Myocardial infarction ~ 2011    " MILD "  . Anemia   . History of blood transfusion     "S/P anemia for 3 years"  . Daily headache     "last few days" (10/03/2013)  . Arthritis     "hands and fingers" (10/03/2013)  . Chronic lower back pain   . Depression     "sometimes; not often" (10/03/2013)  . Lower GI bleeding 02/2012; 10/03/2013     Past Surgical History  Procedure Laterality Date  . Carotid stent    .  Tonsillectomy    . Forearm fracture surgery Left ~ 2005    PLATES AND SCREWS  . Femoral artery stent    . Reduction mammaplasty    . Wrist fracture surgery Left ~ 2005    "it was crushed; they glued it together"  . Coronary angioplasty with stent placement      "1 + +1"  . Iliac artery stent Bilateral   . Esophagogastroduodenoscopy N/A 10/04/2013    Procedure: ESOPHAGOGASTRODUODENOSCOPY (EGD);  Surgeon: Beryle Beams, MD;  Location: [facility] ENDOSCOPY;  Service: Endoscopy;  Laterality: N/A;  . Vaginal hysterectomy      for cancer    History  Smoking status  . Current Every Day Smoker -- 0.50 packs/day for 56 years  . Types: Cigarettes  Smokeless tobacco  . Never Used    History  Alcohol Use  . Yes    Comment: 10/03/2013 "I drink 2-3 times/yr"    Family History  Problem Relation Age of Onset  . Heart attack Father   . Coronary artery disease Father   . Leukemia Mother   . Leukemia Brother     Reviw of Systems:  Reviewed in the HPI.  All other systems are negative.  Physical Exam: BP 110/78  Pulse 85  Ht 5\' 8"  (1.727 m)  Wt 159 lb 1.9 oz (72.176 kg)  BMI 24.20 kg/m2  SpO2 92% The patient is alert and oriented x 3.  The mood and affect are normal.   Skin: warm and dry.  Color is normal.    HEENT:   Normal carotids, no jvd  Lungs: decreased breath sounds in the upper lung fields   Heart: RR    Abdomen: non tender, normal BS  Extremities:  Trace- 1-2+ edema  Neuro:  Normal.    EKG: 03/20/2012-normal sinus rhythm at 71 beats a minute. She has a right bundle branch block.  Assessment / Plan:

## 2014-01-20 NOTE — Addendum Note (Signed)
Addended by: Eulis Foster on: 01/20/2014 11:45 AM   Modules accepted: Orders

## 2014-01-22 ENCOUNTER — Encounter (HOSPITAL_COMMUNITY)
Admission: RE | Admit: 2014-01-22 | Discharge: 2014-01-22 | Disposition: A | Discharge status: Home / Self Care | Payer: Federal, State, Local not specified - PPO | Source: Ambulatory Visit | Attending: Nephrology | Admitting: Nephrology

## 2014-01-22 DIAGNOSIS — D509 Iron deficiency anemia, unspecified: Secondary | ICD-10-CM | POA: Diagnosis not present

## 2014-01-22 LAB — FERRITIN: Ferritin: 403 ng/mL — ABNORMAL HIGH (ref 10–291)

## 2014-01-22 LAB — IRON AND TIBC
Iron: 46 ug/dL (ref 42–135)
SATURATION RATIOS: 16 % — AB (ref 20–55)
TIBC: 282 ug/dL (ref 250–470)
UIBC: 236 ug/dL (ref 125–400)

## 2014-01-22 LAB — POCT HEMOGLOBIN-HEMACUE: HEMOGLOBIN: 12.3 g/dL (ref 12.0–15.0)

## 2014-01-22 MED ORDER — EPOETIN ALFA 10000 UNIT/ML IJ SOLN
INTRAMUSCULAR | Status: DC
Start: 2014-01-22 — End: 2014-01-22
  Filled 2014-01-22: qty 1

## 2014-01-22 MED ORDER — EPOETIN ALFA 20000 UNIT/ML IJ SOLN
INTRAMUSCULAR | Status: AC
  Filled 2014-01-22: qty 1

## 2014-01-22 MED ORDER — EPOETIN ALFA 40000 UNIT/ML IJ SOLN: 30000.0000 [IU] | INTRAMUSCULAR | Status: DC

## 2014-02-04 ENCOUNTER — Other Ambulatory Visit (HOSPITAL_COMMUNITY): Payer: Self-pay | Admitting: *Deleted

## 2014-02-05 ENCOUNTER — Encounter (HOSPITAL_COMMUNITY)
Admission: RE | Admit: 2014-02-05 | Discharge: 2014-02-05 | Disposition: A | Discharge status: Home / Self Care | Payer: Federal, State, Local not specified - PPO | Source: Ambulatory Visit | Attending: Nephrology | Admitting: Nephrology

## 2014-02-05 DIAGNOSIS — D631 Anemia in chronic kidney disease: Secondary | ICD-10-CM | POA: Diagnosis not present

## 2014-02-05 DIAGNOSIS — N184 Chronic kidney disease, stage 4 (severe): Secondary | ICD-10-CM | POA: Diagnosis not present

## 2014-02-05 DIAGNOSIS — N039 Chronic nephritic syndrome with unspecified morphologic changes: Secondary | ICD-10-CM | POA: Diagnosis not present

## 2014-02-05 DIAGNOSIS — D509 Iron deficiency anemia, unspecified: Secondary | ICD-10-CM | POA: Insufficient documentation

## 2014-02-05 LAB — POCT HEMOGLOBIN-HEMACUE: HEMOGLOBIN: 11.2 g/dL — AB (ref 12.0–15.0)

## 2014-02-05 MED ORDER — EPOETIN ALFA 10000 UNIT/ML IJ SOLN
INTRAMUSCULAR | Status: AC
  Administered 2014-02-05: 10000 [IU] via SUBCUTANEOUS
  Filled 2014-02-05: qty 1

## 2014-02-05 MED ORDER — SODIUM CHLORIDE 0.9 % IV SOLN
1020.0000 mg | Freq: Once | INTRAVENOUS | Status: AC
  Administered 2014-02-05: 1020 mg via INTRAVENOUS
  Filled 2014-02-05: qty 34

## 2014-02-05 MED ORDER — EPOETIN ALFA 20000 UNIT/ML IJ SOLN
INTRAMUSCULAR | Status: AC
  Administered 2014-02-05: 20000 [IU] via SUBCUTANEOUS
  Filled 2014-02-05: qty 1

## 2014-02-05 MED ORDER — EPOETIN ALFA 40000 UNIT/ML IJ SOLN: 30000.0000 [IU] | INTRAMUSCULAR | Status: DC

## 2014-02-10 ENCOUNTER — Emergency Department (HOSPITAL_COMMUNITY): Payer: Federal, State, Local not specified - PPO

## 2014-02-10 ENCOUNTER — Emergency Department (HOSPITAL_COMMUNITY)
Admission: EM | Admit: 2014-02-10 | Discharge: 2014-02-10 | Disposition: A | Discharge status: Home / Self Care | Payer: Federal, State, Local not specified - PPO | Attending: Emergency Medicine | Admitting: Emergency Medicine

## 2014-02-10 ENCOUNTER — Encounter (HOSPITAL_COMMUNITY): Payer: Self-pay | Admitting: Emergency Medicine

## 2014-02-10 DIAGNOSIS — K219 Gastro-esophageal reflux disease without esophagitis: Secondary | ICD-10-CM | POA: Insufficient documentation

## 2014-02-10 DIAGNOSIS — Z9861 Coronary angioplasty status: Secondary | ICD-10-CM | POA: Diagnosis not present

## 2014-02-10 DIAGNOSIS — Z87440 Personal history of urinary (tract) infections: Secondary | ICD-10-CM | POA: Insufficient documentation

## 2014-02-10 DIAGNOSIS — M19049 Primary osteoarthritis, unspecified hand: Secondary | ICD-10-CM | POA: Insufficient documentation

## 2014-02-10 DIAGNOSIS — Z8542 Personal history of malignant neoplasm of other parts of uterus: Secondary | ICD-10-CM | POA: Insufficient documentation

## 2014-02-10 DIAGNOSIS — Y9389 Activity, other specified: Secondary | ICD-10-CM | POA: Diagnosis not present

## 2014-02-10 DIAGNOSIS — E7800 Pure hypercholesterolemia, unspecified: Secondary | ICD-10-CM | POA: Insufficient documentation

## 2014-02-10 DIAGNOSIS — J449 Chronic obstructive pulmonary disease, unspecified: Secondary | ICD-10-CM | POA: Diagnosis not present

## 2014-02-10 DIAGNOSIS — I2510 Atherosclerotic heart disease of native coronary artery without angina pectoris: Secondary | ICD-10-CM | POA: Insufficient documentation

## 2014-02-10 DIAGNOSIS — D649 Anemia, unspecified: Secondary | ICD-10-CM | POA: Insufficient documentation

## 2014-02-10 DIAGNOSIS — Z8742 Personal history of other diseases of the female genital tract: Secondary | ICD-10-CM | POA: Diagnosis not present

## 2014-02-10 DIAGNOSIS — J4489 Other specified chronic obstructive pulmonary disease: Secondary | ICD-10-CM | POA: Insufficient documentation

## 2014-02-10 DIAGNOSIS — F17200 Nicotine dependence, unspecified, uncomplicated: Secondary | ICD-10-CM | POA: Insufficient documentation

## 2014-02-10 DIAGNOSIS — I129 Hypertensive chronic kidney disease with stage 1 through stage 4 chronic kidney disease, or unspecified chronic kidney disease: Secondary | ICD-10-CM | POA: Insufficient documentation

## 2014-02-10 DIAGNOSIS — Z9981 Dependence on supplemental oxygen: Secondary | ICD-10-CM | POA: Insufficient documentation

## 2014-02-10 DIAGNOSIS — F3289 Other specified depressive episodes: Secondary | ICD-10-CM | POA: Diagnosis not present

## 2014-02-10 DIAGNOSIS — Y92009 Unspecified place in unspecified non-institutional (private) residence as the place of occurrence of the external cause: Secondary | ICD-10-CM | POA: Insufficient documentation

## 2014-02-10 DIAGNOSIS — G8929 Other chronic pain: Secondary | ICD-10-CM | POA: Insufficient documentation

## 2014-02-10 DIAGNOSIS — IMO0002 Reserved for concepts with insufficient information to code with codable children: Secondary | ICD-10-CM | POA: Insufficient documentation

## 2014-02-10 DIAGNOSIS — M549 Dorsalgia, unspecified: Secondary | ICD-10-CM | POA: Diagnosis present

## 2014-02-10 DIAGNOSIS — N184 Chronic kidney disease, stage 4 (severe): Secondary | ICD-10-CM | POA: Insufficient documentation

## 2014-02-10 DIAGNOSIS — I509 Heart failure, unspecified: Secondary | ICD-10-CM | POA: Diagnosis not present

## 2014-02-10 DIAGNOSIS — Z7982 Long term (current) use of aspirin: Secondary | ICD-10-CM | POA: Insufficient documentation

## 2014-02-10 DIAGNOSIS — I252 Old myocardial infarction: Secondary | ICD-10-CM | POA: Insufficient documentation

## 2014-02-10 DIAGNOSIS — F411 Generalized anxiety disorder: Secondary | ICD-10-CM | POA: Insufficient documentation

## 2014-02-10 DIAGNOSIS — Z7901 Long term (current) use of anticoagulants: Secondary | ICD-10-CM | POA: Insufficient documentation

## 2014-02-10 DIAGNOSIS — F329 Major depressive disorder, single episode, unspecified: Secondary | ICD-10-CM | POA: Diagnosis not present

## 2014-02-10 DIAGNOSIS — X500XXA Overexertion from strenuous movement or load, initial encounter: Secondary | ICD-10-CM | POA: Diagnosis not present

## 2014-02-10 DIAGNOSIS — Z79899 Other long term (current) drug therapy: Secondary | ICD-10-CM | POA: Diagnosis not present

## 2014-02-10 DIAGNOSIS — T148XXA Other injury of unspecified body region, initial encounter: Secondary | ICD-10-CM

## 2014-02-10 MED ORDER — OXYCODONE-ACETAMINOPHEN 5-325 MG PO TABS: 1.0000 | ORAL_TABLET | ORAL | Status: DC | PRN

## 2014-02-10 MED ORDER — TRAMADOL HCL 50 MG PO TABS: 50.0000 mg | ORAL_TABLET | Freq: Four times a day (QID) | ORAL | Status: DC | PRN

## 2014-02-10 MED ORDER — METHOCARBAMOL 500 MG PO TABS: 500.0000 mg | ORAL_TABLET | Freq: Two times a day (BID) | ORAL | Status: DC | PRN

## 2014-02-10 NOTE — ED Notes (Signed)
Pt placed in [place]. Pt hooked up to 12Lead, BP and Pulse Ox.

## 2014-02-10 NOTE — ED Notes (Signed)
MD at bedside. 

## 2014-02-10 NOTE — ED Provider Notes (Signed)
CSN: [id number]     Arrival date & time 02/10/14  1047 History   First MD Initiated Contact with Patient 02/10/14 1103     Chief Complaint  Patient presents with  . Back Pain     (Consider location/radiation/quality/duration/timing/severity/associated sxs/prior Treatment) HPI Wanda Yang is a 73 y.o. female with a history of chronic back pain comes in for evaluation of acute back pain. She states yesterday morning she was sitting in her kitchen and she turned funny and felt pain to go from her left hip into her groin and down to her left kneecap. She describes the pain as sharp then dull. She says walking on it increases the discomfort and lying flat leave the pain. She is able to ambulate independently, but not far due to her COPD. She is able to bear weight. No loss of bowel or bladder function. She denies fevers, nausea vomiting, shortness of breath, chest pain, abdominal pain, numbness or weakness.  Past Medical History  Diagnosis Date  . CAD (coronary artery disease)   . Atherosclerosis of abdominal aorta   . On home oxygen therapy     "3L; 24/7" (10/03/2013)  . Tobacco abuse   . HTN (hypertension)   . COPD (chronic obstructive pulmonary disease)   . PVD (peripheral vascular disease)   . Anxiety   . GERD (gastroesophageal reflux disease)   . RBBB (right bundle branch block)   . Tachycardia   . High cholesterol   . Leukocytosis   . CHF (congestive heart failure)   . Shortness of breath   . UTI (urinary tract infection)   . CKD (chronic kidney disease), stage IV   . Temporal arteritis dx'd 08/2013  . Endometrial carcinoma   . Uterine cancer   . Myocardial infarction ~ 2011    " MILD "  . Anemia   . History of blood transfusion     "S/P anemia for 3 years"  . Daily headache     "last few days" (10/03/2013)  . Arthritis     "hands and fingers" (10/03/2013)  . Chronic lower back pain   . Depression     "sometimes; not often" (10/03/2013)  . Lower GI bleeding 02/2012;  10/03/2013   Past Surgical History  Procedure Laterality Date  . Carotid stent    . Tonsillectomy    . Forearm fracture surgery Left ~ 2005    PLATES AND SCREWS  . Femoral artery stent    . Reduction mammaplasty    . Wrist fracture surgery Left ~ 2005    "it was crushed; they glued it together"  . Coronary angioplasty with stent placement      "1 + +1"  . Iliac artery stent Bilateral   . Esophagogastroduodenoscopy N/A 10/04/2013    Procedure: ESOPHAGOGASTRODUODENOSCOPY (EGD);  Surgeon: Beryle Beams, MD;  Location: [facility] ENDOSCOPY;  Service: Endoscopy;  Laterality: N/A;  . Vaginal hysterectomy      for cancer   Family History  Problem Relation Age of Onset  . Heart attack Father   . Coronary artery disease Father   . Leukemia Mother   . Leukemia Brother    History  Substance Use Topics  . Smoking status: Current Every Day Smoker -- 0.50 packs/day for 56 years    Types: Cigarettes  . Smokeless tobacco: Never Used  . Alcohol Use: Yes     Comment: 10/03/2013 "I drink 2-3 times/yr"   OB History   Grav Para Term Preterm Abortions TAB SAB Ect  Mult Living                 Review of Systems  Constitutional: Negative for fever.  Respiratory: Negative for shortness of breath.   Cardiovascular: Negative for chest pain.  Musculoskeletal: Positive for back pain.  Skin: Negative for rash.  Neurological: Negative for weakness and numbness.      Allergies  Uloric; Zolpidem tartrate; Ciprofloxacin; Contrast media; Tetracycline; and Vicodin  Home Medications   Prior to Admission medications   Medication Sig Start Date End Date Taking? Authorizing Provider  allopurinol (ZYLOPRIM) 100 MG tablet Take 100 mg by mouth 2 (two) times daily.      Historical Provider, MD  arformoterol (BROVANA) 15 MCG/2ML NEBU Take 15 mcg by nebulization daily.    Historical Provider, MD  aspirin 81 MG tablet Take 1 tablet (81 mg total) by mouth daily. 10/17/13   Charlynne Cousins, MD  budesonide  (PULMICORT) 0.5 MG/2ML nebulizer solution Take 0.5 mg by nebulization daily.    Historical Provider, MD  clopidogrel (PLAVIX) 75 MG tablet Take 1 tablet (75 mg total) by mouth daily. 10/17/13   Charlynne Cousins, MD  diltiazem (DILACOR XR) 240 MG 24 hr capsule Take 1 capsule (240 mg total) by mouth daily. 07/15/13   Thayer Headings, MD  famotidine (PEPCID) 20 MG tablet Take 20 mg by mouth 2 (two) times daily as needed for heartburn.     Historical Provider, MD  ferrous sulfate 325 (65 FE) MG tablet Take 1 tablet (325 mg total) by mouth daily with breakfast. 10/05/13   Charlynne Cousins, MD  furosemide (LASIX) 40 MG tablet Take 40 mg by mouth daily. 160 MG in morning and 80 MG in the evening-----For weight gain 12/15/11   Historical Provider, MD  methocarbamol (ROBAXIN) 500 MG tablet Take 1 tablet (500 mg total) by mouth 2 (two) times daily as needed for muscle spasms. 02/10/14   Viona Gilmore Nazim Kadlec, PA-C  nitroGLYCERIN (NITROSTAT) 0.4 MG SL tablet Place 0.4 mg under the tongue every 5 (five) minutes as needed for chest pain.    Historical Provider, MD  pantoprazole (PROTONIX) 40 MG tablet Take 1 tablet (40 mg total) by mouth 2 (two) times daily. 10/05/13   Charlynne Cousins, MD  predniSONE (DELTASONE) 20 MG tablet Take 15 mg by mouth daily with breakfast.     Historical Provider, MD  PROAIR HFA 108 (90 BASE) MCG/ACT inhaler INHALE 2 PUFFS INTO THE LUNGS EVERY 4 HOURS AS NEEDED. 11/15/13   Kathee Delton, MD  Probiotic Product (ALIGN PO) Take by mouth daily.    Historical Provider, MD  rosuvastatin (CRESTOR) 20 MG tablet Take 1 tablet (20 mg total) by mouth daily. 07/15/13   Thayer Headings, MD  SPIRIVA HANDIHALER 18 MCG inhalation capsule INHALE THE CONTENTS OF 1   CAPSULE DAILY VIA          HANDIHALER    Kathee Delton, MD  traMADol (ULTRAM) 50 MG tablet Take 50 mg by mouth every 6 (six) hours as needed.    Historical Provider, MD  venlafaxine XR (EFFEXOR-XR) 75 MG 24 hr capsule Take 75 mg by mouth daily.      Historical Provider, MD  vitamin B-12 (CYANOCOBALAMIN) 1000 MCG tablet Take 1,000 mcg by mouth daily.    Historical Provider, MD   BP 164/82  Pulse 95  Temp(Src) 97.7 F (36.5 C) (Oral)  Resp 16  SpO2 91% Physical Exam  Nursing note and vitals reviewed. Constitutional:  Awake, alert, nontoxic appearance with baseline speech.  HENT:  Head: Atraumatic.  Eyes: Pupils are equal, round, and reactive to light. Right eye exhibits no discharge. Left eye exhibits no discharge.  Neck: Neck supple.  Cardiovascular: Normal rate and regular rhythm.   No murmur heard. Pulmonary/Chest: Effort normal and breath sounds normal. No respiratory distress. She has no wheezes. She has no rales. She exhibits no tenderness.  Abdominal: Soft. Bowel sounds are normal. She exhibits no mass. There is no tenderness. There is no rebound.  Musculoskeletal:       Thoracic back: She exhibits no tenderness.       Lumbar back: She exhibits no tenderness.  No bony tenderness to cervical, thoracic, or lumbar spine. No tenderness with palpation of pelvis or femur. Bilateral lower extremities non tender without new rashes or color change, baseline ROM with intact DP / PT pulses, CR<2 secs all digits bilaterally, sensation baseline light touch bilaterally for pt, motor symmetric bilateral 5 / 5 hip flexion, quadriceps, hamstrings, EHL, foot dorsiflexion, foot plantarflexion, gait without apparent new ataxia.  Neurological:  Mental status baseline for patient.  Upper extremity motor strength and sensation intact and symmetric bilaterally.  Skin: No rash noted.  Psychiatric: She has a normal mood and affect.    ED Course  Procedures (including critical care time) Labs Review Labs Reviewed - No data to display  Imaging Review Dg Hip Complete Left  02/10/2014   CLINICAL DATA:  Left hip and leg pain.  EXAM: LEFT HIP - COMPLETE 2+ VIEW  COMPARISON:  None.  FINDINGS: No acute fracture or dislocation is identified. No  significant arthropathy. The bony pelvis appears intact. No bony lesions or destruction. Bilateral iliac arterial stents identified. Iliac and femoral arteries are extensively calcified. No soft tissue abnormalities.  IMPRESSION: No acute fracture or significant arthropathy.   Electronically Signed   By: Aletta Edouard M.D.   On: 02/10/2014 13:25   Dg Femur Left  02/10/2014   CLINICAL DATA:  Left leg pain for 2 days, no trauma.  EXAM: LEFT FEMUR - 2 VIEW  COMPARISON:  None.  FINDINGS: There is no evidence of fracture or other focal bone lesions. Soft tissues are unremarkable. Vascular calcifications and left iliac stent are noted.  IMPRESSION: Negative.   Electronically Signed   By: Conchita Paris M.D.   On: 02/10/2014 13:24     EKG Interpretation None      MDM  Vitals stable - WNL -afebrile Pt resting comfortably in ED. Denied pain when walking. Denied pain with palpation to injured extremity. No pain with active or passive range of motion X-ray showed no sign of fracture or dislocation Physical exam and clinical picture not consistent with any acute emergent pathology. Pain seems to be musculoskeletal in nature will DC with Robaxin and she may continue to use her home pain medications. Discussed f/u with PCP and return precautions, pt very amenable to plan.   Final diagnoses:  Muscle strain  Prior to patient discharge, I discussed and reviewed this case with Dr. Greggory Brandy, PA-C 02/10/14 1358

## 2014-02-10 NOTE — ED Provider Notes (Signed)
Pt with lower back pain after turning in her chair yesterday - acute onset, then improved and is essentially absent when she is laying down - worse with sitting or standing.  No radiation down the legs, no numbness, weakness, no urinary sx, no fever, no IVDU.  On exam she has no ttp in the abd, no ttp in the lower back, no abd pulsating mass, normal neuro to legs with strength, coordination and sensation.  xrays pending,  Likely strain type injury, doubt pathological source.  Medical screening examination/treatment/procedure(s) were conducted as a shared visit with non-physician practitioner(s) and myself.  I personally evaluated the patient during the encounter.  Clinical Impression:   Final diagnoses:  Muscle strain         Johnna Acosta, MD 02/12/14 [phone]

## 2014-02-10 NOTE — Discharge Instructions (Signed)
Muscle Strain A muscle strain is an injury that occurs when a muscle is stretched beyond its normal length. Usually a small number of muscle fibers are torn when this happens. Muscle strain is rated in degrees. First-degree strains have the least amount of muscle fiber tearing and pain. Second-degree and third-degree strains have increasingly more tearing and pain.  Usually, recovery from muscle strain takes 1-2 weeks. Complete healing takes 5-6 weeks.  CAUSES  Muscle strain happens when a sudden, violent force placed on a muscle stretches it too far. This may occur with lifting, sports, or a fall.  RISK FACTORS Muscle strain is especially common in athletes.  SIGNS AND SYMPTOMS At the site of the muscle strain, there may be:  Pain.  Bruising.  Swelling.  Difficulty using the muscle due to pain or lack of normal function. DIAGNOSIS  Your health care provider will perform a physical exam and ask about your medical history. TREATMENT  Often, the best treatment for a muscle strain is resting, icing, and applying cold compresses to the injured area.  HOME CARE INSTRUCTIONS   Use the PRICE method of treatment to promote muscle healing during the first 2-3 days after your injury. The PRICE method involves:  Protecting the muscle from being injured again.  Restricting your activity and resting the injured body part.  Icing your injury. To do this, put ice in a plastic bag. Place a towel between your skin and the bag. Then, apply the ice and leave it on from 15-20 minutes each hour. After the third day, switch to moist heat packs.  Apply compression to the injured area with a splint or elastic bandage. Be careful not to wrap it too tightly. This may interfere with blood circulation or increase swelling.  Elevate the injured body part above the level of your heart as often as you can.  Only take over-the-counter or prescription medicines for pain, discomfort, or fever as directed by your  health care provider.  Warming up prior to exercise helps to prevent future muscle strains. SEEK MEDICAL CARE IF:   You have increasing pain or swelling in the injured area.  You have numbness, tingling, or a significant loss of strength in the injured area. MAKE SURE YOU:   Understand these instructions.  Will watch your condition.  Will get help right away if you are not doing well or get worse. Document Released: 05/23/2005 Document Revised: 03/13/2013 Document Reviewed: 12/20/2012 [facility] Patient Information 2015 [place], [place]. This information is not intended to replace advice given to you by your health care provider. Make sure you discuss any questions you have with your health care provider.   Her evaluation of leg pain today in the ED showed no evidence of fracture or dislocation. Your pain is likely due to muscle strain. You may take the Robaxin as prescribed for the muscle pain. He may followup with your primary care provider for further evaluation and management of your leg pain. Return to ED for further evaluation if your symptoms worsen, he experienced new numbness or weakness, fevers.

## 2014-02-10 NOTE — ED Notes (Signed)
Per EMS- pt has hx of back pain that has worsened over the last few days. Pt states that pain is worse with movement. Pt states that she has nausea as well. BP 164/82. HR irregular 90. resp 20.

## 2014-02-12 NOTE — ED Provider Notes (Signed)
Medical screening examination/treatment/procedure(s) were conducted as a shared visit with non-physician practitioner(s) and myself.  I personally evaluated the patient during the encounter  Please see my separate respective documentation pertaining to this patient encounter   Johnna Acosta, MD 02/12/14 [phone]

## 2014-02-19 ENCOUNTER — Inpatient Hospital Stay (HOSPITAL_COMMUNITY): Admission: RE | Admit: 2014-02-19 | Payer: Federal, State, Local not specified - PPO | Source: Ambulatory Visit

## 2014-02-20 ENCOUNTER — Emergency Department (HOSPITAL_COMMUNITY)
Admission: EM | Admit: 2014-02-20 | Discharge: 2014-02-20 | Disposition: A | Discharge status: Home / Self Care | Payer: Medicare (Managed Care) | Attending: Emergency Medicine | Admitting: Emergency Medicine

## 2014-02-20 ENCOUNTER — Encounter (HOSPITAL_COMMUNITY): Payer: Self-pay | Admitting: Emergency Medicine

## 2014-02-20 ENCOUNTER — Ambulatory Visit: Payer: Federal, State, Local not specified - PPO | Admitting: Pulmonary Disease

## 2014-02-20 ENCOUNTER — Emergency Department (HOSPITAL_COMMUNITY): Payer: Medicare (Managed Care)

## 2014-02-20 DIAGNOSIS — I2510 Atherosclerotic heart disease of native coronary artery without angina pectoris: Secondary | ICD-10-CM | POA: Diagnosis not present

## 2014-02-20 DIAGNOSIS — K5900 Constipation, unspecified: Secondary | ICD-10-CM | POA: Insufficient documentation

## 2014-02-20 DIAGNOSIS — F17200 Nicotine dependence, unspecified, uncomplicated: Secondary | ICD-10-CM | POA: Insufficient documentation

## 2014-02-20 DIAGNOSIS — M5441 Lumbago with sciatica, right side: Secondary | ICD-10-CM

## 2014-02-20 DIAGNOSIS — F411 Generalized anxiety disorder: Secondary | ICD-10-CM | POA: Diagnosis not present

## 2014-02-20 DIAGNOSIS — I252 Old myocardial infarction: Secondary | ICD-10-CM | POA: Insufficient documentation

## 2014-02-20 DIAGNOSIS — K219 Gastro-esophageal reflux disease without esophagitis: Secondary | ICD-10-CM | POA: Diagnosis not present

## 2014-02-20 DIAGNOSIS — E7800 Pure hypercholesterolemia, unspecified: Secondary | ICD-10-CM | POA: Diagnosis not present

## 2014-02-20 DIAGNOSIS — M5430 Sciatica, unspecified side: Secondary | ICD-10-CM | POA: Diagnosis not present

## 2014-02-20 DIAGNOSIS — M19049 Primary osteoarthritis, unspecified hand: Secondary | ICD-10-CM | POA: Insufficient documentation

## 2014-02-20 DIAGNOSIS — Z7982 Long term (current) use of aspirin: Secondary | ICD-10-CM | POA: Diagnosis not present

## 2014-02-20 DIAGNOSIS — J449 Chronic obstructive pulmonary disease, unspecified: Secondary | ICD-10-CM | POA: Insufficient documentation

## 2014-02-20 DIAGNOSIS — I129 Hypertensive chronic kidney disease with stage 1 through stage 4 chronic kidney disease, or unspecified chronic kidney disease: Secondary | ICD-10-CM | POA: Diagnosis not present

## 2014-02-20 DIAGNOSIS — Z9861 Coronary angioplasty status: Secondary | ICD-10-CM | POA: Insufficient documentation

## 2014-02-20 DIAGNOSIS — G8929 Other chronic pain: Secondary | ICD-10-CM | POA: Diagnosis not present

## 2014-02-20 DIAGNOSIS — Z79899 Other long term (current) drug therapy: Secondary | ICD-10-CM | POA: Diagnosis not present

## 2014-02-20 DIAGNOSIS — J4489 Other specified chronic obstructive pulmonary disease: Secondary | ICD-10-CM | POA: Insufficient documentation

## 2014-02-20 DIAGNOSIS — D649 Anemia, unspecified: Secondary | ICD-10-CM | POA: Insufficient documentation

## 2014-02-20 DIAGNOSIS — Z8542 Personal history of malignant neoplasm of other parts of uterus: Secondary | ICD-10-CM | POA: Insufficient documentation

## 2014-02-20 DIAGNOSIS — I509 Heart failure, unspecified: Secondary | ICD-10-CM | POA: Insufficient documentation

## 2014-02-20 DIAGNOSIS — F329 Major depressive disorder, single episode, unspecified: Secondary | ICD-10-CM | POA: Insufficient documentation

## 2014-02-20 DIAGNOSIS — N184 Chronic kidney disease, stage 4 (severe): Secondary | ICD-10-CM | POA: Diagnosis not present

## 2014-02-20 DIAGNOSIS — Z9981 Dependence on supplemental oxygen: Secondary | ICD-10-CM | POA: Insufficient documentation

## 2014-02-20 DIAGNOSIS — F3289 Other specified depressive episodes: Secondary | ICD-10-CM | POA: Diagnosis not present

## 2014-02-20 DIAGNOSIS — Z7902 Long term (current) use of antithrombotics/antiplatelets: Secondary | ICD-10-CM | POA: Insufficient documentation

## 2014-02-20 DIAGNOSIS — Z87440 Personal history of urinary (tract) infections: Secondary | ICD-10-CM | POA: Insufficient documentation

## 2014-02-20 LAB — URINALYSIS, ROUTINE W REFLEX MICROSCOPIC
BILIRUBIN URINE: NEGATIVE
Glucose, UA: NEGATIVE mg/dL
Hgb urine dipstick: NEGATIVE
KETONES UR: NEGATIVE mg/dL
LEUKOCYTES UA: NEGATIVE
NITRITE: NEGATIVE
PROTEIN: NEGATIVE mg/dL
Specific Gravity, Urine: 1.014 (ref 1.005–1.030)
UROBILINOGEN UA: 0.2 mg/dL (ref 0.0–1.0)
pH: 6 (ref 5.0–8.0)

## 2014-02-20 MED ORDER — METHYLPREDNISOLONE (PAK) 4 MG PO TABS: ORAL_TABLET | ORAL | Status: DC

## 2014-02-20 MED ORDER — FLEET ENEMA 7-19 GM/118ML RE ENEM: 1.0000 | ENEMA | Freq: Every day | RECTAL | Status: DC | PRN

## 2014-02-20 MED ORDER — PEG 3350-KCL-NABCB-NACL-NASULF 236 G PO SOLR: 4000.0000 mL | Freq: Once | ORAL | Status: DC

## 2014-02-20 MED ORDER — FLEET ENEMA 7-19 GM/118ML RE ENEM
1.0000 | ENEMA | Freq: Once | RECTAL | Status: AC
  Administered 2014-02-20: 1 via RECTAL
  Filled 2014-02-20: qty 1

## 2014-02-20 NOTE — ED Notes (Signed)
Pt in c/o constipation, states her last normal BM was on labor day, states she was seen then for back pain and started on oxycodone

## 2014-02-20 NOTE — ED Provider Notes (Addendum)
CSN: [id number]     Arrival date & time 02/20/14  1215 History   First MD Initiated Contact with Patient 02/20/14 1437     Chief Complaint  Patient presents with  . Constipation     (Consider location/radiation/quality/duration/timing/severity/associated sxs/prior Treatment) HPI  This a 73 year old female who presents with constipation. She reports that she has not had a good bowel movement in 7 or 8 days. She was placed on oxycodone for a musculoskeletal sprain and has not been taking a bowel regimen. Since being constipated, she has tried MiraLAX, stool softeners without relief. She denies any vomiting or abdominal pain.  She continues to have some back pain. She reports pain over the lower back that radiates into her left hip and leg. Denies any weakness, numbness, tingling of lower extremities. Has been ambulatory. Denies any urinary retention.  Past Medical History  Diagnosis Date  . CAD (coronary artery disease)   . Atherosclerosis of abdominal aorta   . On home oxygen therapy     "3L; 24/7" (10/03/2013)  . Tobacco abuse   . HTN (hypertension)   . COPD (chronic obstructive pulmonary disease)   . PVD (peripheral vascular disease)   . Anxiety   . GERD (gastroesophageal reflux disease)   . RBBB (right bundle branch block)   . Tachycardia   . High cholesterol   . Leukocytosis   . CHF (congestive heart failure)   . Shortness of breath   . UTI (urinary tract infection)   . CKD (chronic kidney disease), stage IV   . Temporal arteritis dx'd 08/2013  . Endometrial carcinoma   . Uterine cancer   . Myocardial infarction ~ 2011    " MILD "  . Anemia   . History of blood transfusion     "S/P anemia for 3 years"  . Daily headache     "last few days" (10/03/2013)  . Arthritis     "hands and fingers" (10/03/2013)  . Chronic lower back pain   . Depression     "sometimes; not often" (10/03/2013)  . Lower GI bleeding 02/2012; 10/03/2013   Past Surgical History  Procedure Laterality  Date  . Carotid stent    . Tonsillectomy    . Forearm fracture surgery Left ~ 2005    PLATES AND SCREWS  . Femoral artery stent    . Reduction mammaplasty    . Wrist fracture surgery Left ~ 2005    "it was crushed; they glued it together"  . Coronary angioplasty with stent placement      "1 + +1"  . Iliac artery stent Bilateral   . Esophagogastroduodenoscopy N/A 10/04/2013    Procedure: ESOPHAGOGASTRODUODENOSCOPY (EGD);  Surgeon: Beryle Beams, MD;  Location: [facility] ENDOSCOPY;  Service: Endoscopy;  Laterality: N/A;  . Vaginal hysterectomy      for cancer   Family History  Problem Relation Age of Onset  . Heart attack Father   . Coronary artery disease Father   . Leukemia Mother   . Leukemia Brother    History  Substance Use Topics  . Smoking status: Current Every Day Smoker -- 0.50 packs/day for 56 years    Types: Cigarettes  . Smokeless tobacco: Never Used  . Alcohol Use: Yes     Comment: 10/03/2013 "I drink 2-3 times/yr"   OB History   Grav Para Term Preterm Abortions TAB SAB Ect Mult Living                 Review of Systems  Constitutional: Negative for fever.  Respiratory: Negative for chest tightness and shortness of breath.   Cardiovascular: Negative for chest pain.  Gastrointestinal: Positive for constipation. Negative for nausea, vomiting and abdominal pain.  Genitourinary: Negative for dysuria.  Musculoskeletal: Positive for back pain.  Skin: Negative for wound.  All other systems reviewed and are negative.     Allergies  Uloric; Zolpidem tartrate; Ciprofloxacin; Contrast media; Tetracycline; and Vicodin  Home Medications   Prior to Admission medications   Medication Sig Start Date End Date Taking? Authorizing Provider  allopurinol (ZYLOPRIM) 100 MG tablet Take 100 mg by mouth 2 (two) times daily.      Historical Provider, MD  arformoterol (BROVANA) 15 MCG/2ML NEBU Take 15 mcg by nebulization daily.    Historical Provider, MD  aspirin 81 MG tablet Take 1  tablet (81 mg total) by mouth daily. 10/17/13   Charlynne Cousins, MD  budesonide (PULMICORT) 0.5 MG/2ML nebulizer solution Take 0.5 mg by nebulization daily.    Historical Provider, MD  clopidogrel (PLAVIX) 75 MG tablet Take 1 tablet (75 mg total) by mouth daily. 10/17/13   Charlynne Cousins, MD  diltiazem (DILACOR XR) 240 MG 24 hr capsule Take 1 capsule (240 mg total) by mouth daily. 07/15/13   Thayer Headings, MD  famotidine (PEPCID) 20 MG tablet Take 20 mg by mouth 2 (two) times daily as needed for heartburn.     Historical Provider, MD  ferrous sulfate 325 (65 FE) MG tablet Take 1 tablet (325 mg total) by mouth daily with breakfast. 10/05/13   Charlynne Cousins, MD  furosemide (LASIX) 40 MG tablet Take 40 mg by mouth daily. 160 MG in morning and 80 MG in the evening-----For weight gain 12/15/11   Historical Provider, MD  methocarbamol (ROBAXIN) 500 MG tablet Take 1 tablet (500 mg total) by mouth 2 (two) times daily as needed for muscle spasms. 02/10/14   Viona Gilmore Cartner, PA-C  methylPREDNIsolone (MEDROL DOSPACK) 4 MG tablet follow package directions 02/20/14   Merryl Hacker, MD  nitroGLYCERIN (NITROSTAT) 0.4 MG SL tablet Place 0.4 mg under the tongue every 5 (five) minutes as needed for chest pain.    Historical Provider, MD  oxyCODONE-acetaminophen (PERCOCET) 5-325 MG per tablet Take 1 tablet by mouth every 4 (four) hours as needed. 02/10/14   Johnna Acosta, MD  pantoprazole (PROTONIX) 40 MG tablet Take 1 tablet (40 mg total) by mouth 2 (two) times daily. 10/05/13   Charlynne Cousins, MD  polyethylene glycol (GOLYTELY) 236 G solution Take 4,000 mLs by mouth once. 02/20/14   Merryl Hacker, MD  predniSONE (DELTASONE) 20 MG tablet Take 15 mg by mouth daily with breakfast.     Historical Provider, MD  PROAIR HFA 108 (90 BASE) MCG/ACT inhaler INHALE 2 PUFFS INTO THE LUNGS EVERY 4 HOURS AS NEEDED. 11/15/13   Kathee Delton, MD  Probiotic Product (ALIGN PO) Take by mouth daily.    Historical  Provider, MD  rosuvastatin (CRESTOR) 20 MG tablet Take 1 tablet (20 mg total) by mouth daily. 07/15/13   Thayer Headings, MD  sodium phosphate (FLEET) 7-19 GM/118ML ENEM Place 133 mLs (1 enema total) rectally daily as needed for severe constipation. 02/20/14   Merryl Hacker, MD  SPIRIVA HANDIHALER 18 MCG inhalation capsule INHALE THE CONTENTS OF 1   CAPSULE DAILY VIA          HANDIHALER    Kathee Delton, MD  traMADol (ULTRAM) 50 MG tablet  Take 50 mg by mouth every 6 (six) hours as needed.    Historical Provider, MD  venlafaxine XR (EFFEXOR-XR) 75 MG 24 hr capsule Take 75 mg by mouth daily.     Historical Provider, MD  vitamin B-12 (CYANOCOBALAMIN) 1000 MCG tablet Take 1,000 mcg by mouth daily.    Historical Provider, MD   BP 145/96  Pulse 87  Temp(Src) 99.3 F (37.4 C) (Oral)  Resp 16  SpO2 100% Physical Exam  Nursing note and vitals reviewed. Constitutional: She is oriented to person, place, and time. No distress.  Elderly  HENT:  Head: Normocephalic and atraumatic.  Eyes: Pupils are equal, round, and reactive to light.  Cardiovascular: Normal rate, regular rhythm and normal heart sounds.   Pulmonary/Chest: Effort normal. No respiratory distress. She has no wheezes.  Coarse breath sounds, nasal cannula in place  Abdominal: Soft. Bowel sounds are normal. There is no tenderness. There is no rebound and no guarding.  Genitourinary:  Normal rectal tone, hard stool noted in the rectal vault  Neurological: She is alert and oriented to person, place, and time.  5 strength in bilateral lower extremities, no clonus noted  Skin: Skin is warm and dry.  Psychiatric: She has a normal mood and affect.    ED Course  Procedures (including critical care time) Labs Review Labs Reviewed  URINALYSIS, ROUTINE W REFLEX MICROSCOPIC - Abnormal; Notable for the following:    APPearance CLOUDY (*)    All other components within normal limits    Imaging Review Dg Abd 1 View  02/20/2014   CLINICAL  DATA:  Decreased urine output. History of uterine cancer. Constipation.  EXAM: ABDOMEN - 1 VIEW  COMPARISON:  11/01/2012  FINDINGS: Right common and left external iliac artery stents. Atherosclerotic vascular calcifications.  Prominent stool throughout the colon favors constipation. No dilated small bowel observed. No compelling findings of renal or ureteral calculi.  IMPRESSION: 1.  Prominent stool throughout the colon favors constipation. 2. Atherosclerosis with iliac stents bilaterally.   Electronically Signed   By: Sherryl Barters M.D.   On: 02/20/2014 15:57     EKG Interpretation None      MDM   Final diagnoses:  Constipation, unspecified constipation type  Right-sided low back pain with right-sided sciatica    Patient presents with constipation.  Has not had a good BM in over 1 week.  Does report small, hard BMs but nothing "big."  Denies abdominal pain but does report rectal discomfort.  Nontoxic on exam.  VS stable.  Manually disimpacted with hard stool at the bedside.  Enema ordered. KUB and UA obtained and unremarkable.  Patient has been on oxycodone for back pain which is likely the culprit.  Has had a few "small" bowel movements in the ED. Abdominal exam continues to be benign.  Clinical picture not consistent with obstruction.  Bowel regimen not yet optimized. Discussed with patient aggressive bowel clean out with Golytely and continuing stool softeners.  I also offered the patient a medrol dosepak given continued sciatica pain.  Patient is to f/u with PCP in 1-2 days if bowel regimen is unsuccessful at home.  After history, exam, and medical workup I feel the patient has been appropriately medically screened and is safe for discharge home. Pertinent diagnoses were discussed with the patient. Patient was given return precautions.     Merryl Hacker, MD 02/22/14 [place], MD 02/22/14 1323

## 2014-02-20 NOTE — Discharge Instructions (Signed)
You were seen today for constipation. Your constipation is likely because of the pain medication you have been taking.  You should take a daily stool softener.  YOu will be given GoLytely to try to clean you out.    You also have symptoms consistent with sciatica.  Steroids may help with nerve inflammation but do not want those at this time.  If you develop weakness or numbness in the legs or difficulty urinating, you need to be re-evaluated and may need MRI.  No indication for MRI at this time.    Sciatica Sciatica is pain, weakness, numbness, or tingling along the path of the sciatic nerve. The nerve starts in the lower back and runs down the back of each leg. The nerve controls the muscles in the lower leg and in the back of the knee, while also providing sensation to the back of the thigh, lower leg, and the sole of your foot. Sciatica is a symptom of another medical condition. For instance, nerve damage or certain conditions, such as a herniated disk or bone spur on the spine, pinch or put pressure on the sciatic nerve. This causes the pain, weakness, or other sensations normally associated with sciatica. Generally, sciatica only affects one side of the body. CAUSES   Herniated or slipped disc.  Degenerative disk disease.  A pain disorder involving the narrow muscle in the buttocks (piriformis syndrome).  Pelvic injury or fracture.  Pregnancy.  Tumor (rare). SYMPTOMS  Symptoms can vary from mild to very severe. The symptoms usually travel from the low back to the buttocks and down the back of the leg. Symptoms can include:  Mild tingling or dull aches in the lower back, leg, or hip.  Numbness in the back of the calf or sole of the foot.  Burning sensations in the lower back, leg, or hip.  Sharp pains in the lower back, leg, or hip.  Leg weakness.  Severe back pain inhibiting movement. These symptoms may get worse with coughing, sneezing, laughing, or prolonged sitting or  standing. Also, being overweight may worsen symptoms. DIAGNOSIS  Your caregiver will perform a physical exam to look for common symptoms of sciatica. He or she may ask you to do certain movements or activities that would trigger sciatic nerve pain. Other tests may be performed to find the cause of the sciatica. These may include:  Blood tests.  X-rays.  Imaging tests, such as an MRI or CT scan. TREATMENT  Treatment is directed at the cause of the sciatic pain. Sometimes, treatment is not necessary and the pain and discomfort goes away on its own. If treatment is needed, your caregiver may suggest:  Over-the-counter medicines to relieve pain.  Prescription medicines, such as anti-inflammatory medicine, muscle relaxants, or narcotics.  Applying heat or ice to the painful area.  Steroid injections to lessen pain, irritation, and inflammation around the nerve.  Reducing activity during periods of pain.  Exercising and stretching to strengthen your abdomen and improve flexibility of your spine. Your caregiver may suggest losing weight if the extra weight makes the back pain worse.  Physical therapy.  Surgery to eliminate what is pressing or pinching the nerve, such as a bone spur or part of a herniated disk. HOME CARE INSTRUCTIONS   Only take over-the-counter or prescription medicines for pain or discomfort as directed by your caregiver.  Apply ice to the affected area for 20 minutes, 3-4 times a day for the first 48-72 hours. Then try heat in the same  way.  Exercise, stretch, or perform your usual activities if these do not aggravate your pain.  Attend physical therapy sessions as directed by your caregiver.  Keep all follow-up appointments as directed by your caregiver.  Do not wear high heels or shoes that do not provide proper support.  Check your mattress to see if it is too soft. A firm mattress may lessen your pain and discomfort. SEEK IMMEDIATE MEDICAL CARE IF:   You  lose control of your bowel or bladder (incontinence).  You have increasing weakness in the lower back, pelvis, buttocks, or legs.  You have redness or swelling of your back.  You have a burning sensation when you urinate.  You have pain that gets worse when you lie down or awakens you at night.  Your pain is worse than you have experienced in the past.  Your pain is lasting longer than 4 weeks.  You are suddenly losing weight without reason. MAKE SURE YOU:  Understand these instructions.  Will watch your condition.  Will get help right away if you are not doing well or get worse. Document Released: 05/17/2001 Document Revised: 11/22/2011 Document Reviewed: 10/02/2011 [facility] Patient Information 2015 [place], [place]. This information is not intended to replace advice given to you by your health care provider. Make sure you discuss any questions you have with your health care provider.  Constipation Constipation is when a person has fewer than three bowel movements a week, has difficulty having a bowel movement, or has stools that are dry, hard, or larger than normal. As people grow older, constipation is more common. If you try to fix constipation with medicines that make you have a bowel movement (laxatives), the problem may get worse. Long-term laxative use may cause the muscles of the colon to become weak. A low-fiber diet, not taking in enough fluids, and taking certain medicines may make constipation worse.  CAUSES   Certain medicines, such as antidepressants, pain medicine, iron supplements, antacids, and water pills.   Certain diseases, such as diabetes, irritable bowel syndrome (IBS), thyroid disease, or depression.   Not drinking enough water.   Not eating enough fiber-rich foods.   Stress or travel.   Lack of physical activity or exercise.   Ignoring the urge to have a bowel movement.   Using laxatives too much.  SIGNS AND SYMPTOMS   Having fewer than  three bowel movements a week.   Straining to have a bowel movement.   Having stools that are hard, dry, or larger than normal.   Feeling full or bloated.   Pain in the lower abdomen.   Not feeling relief after having a bowel movement.  DIAGNOSIS  Your health care provider will take a medical history and perform a physical exam. Further testing may be done for severe constipation. Some tests may include:  A barium enema X-ray to examine your rectum, colon, and, sometimes, your small intestine.   A sigmoidoscopy to examine your lower colon.   A colonoscopy to examine your entire colon. TREATMENT  Treatment will depend on the severity of your constipation and what is causing it. Some dietary treatments include drinking more fluids and eating more fiber-rich foods. Lifestyle treatments may include regular exercise. If these diet and lifestyle recommendations do not help, your health care provider may recommend taking over-the-counter laxative medicines to help you have bowel movements. Prescription medicines may be prescribed if over-the-counter medicines do not work.  HOME CARE INSTRUCTIONS   Eat foods that have a lot  of fiber, such as fruits, vegetables, whole grains, and beans.  Limit foods high in fat and processed sugars, such as french fries, hamburgers, cookies, candies, and soda.   A fiber supplement may be added to your diet if you cannot get enough fiber from foods.   Drink enough fluids to keep your urine clear or pale yellow.   Exercise regularly or as directed by your health care provider.   Go to the restroom when you have the urge to go. Do not hold it.   Only take over-the-counter or prescription medicines as directed by your health care provider. Do not take other medicines for constipation without talking to your health care provider first.  [place] IF:   You have bright red blood in your stool.   Your constipation lasts for  more than 4 days or gets worse.   You have abdominal or rectal pain.   You have thin, pencil-like stools.   You have unexplained weight loss. MAKE SURE YOU:   Understand these instructions.  Will watch your condition.  Will get help right away if you are not doing well or get worse. Document Released: 02/19/2004 Document Revised: 05/28/2013 Document Reviewed: 03/04/2013 [facility] Patient Information 2015 [place], [place]. This information is not intended to replace advice given to you by your health care provider. Make sure you discuss any questions you have with your health care provider.

## 2014-02-21 ENCOUNTER — Encounter (HOSPITAL_COMMUNITY): Payer: Self-pay | Admitting: Emergency Medicine

## 2014-02-21 DIAGNOSIS — I129 Hypertensive chronic kidney disease with stage 1 through stage 4 chronic kidney disease, or unspecified chronic kidney disease: Secondary | ICD-10-CM | POA: Diagnosis present

## 2014-02-21 DIAGNOSIS — K56609 Unspecified intestinal obstruction, unspecified as to partial versus complete obstruction: Principal | ICD-10-CM | POA: Diagnosis present

## 2014-02-21 DIAGNOSIS — J4489 Other specified chronic obstructive pulmonary disease: Secondary | ICD-10-CM | POA: Diagnosis present

## 2014-02-21 DIAGNOSIS — I4510 Unspecified right bundle-branch block: Secondary | ICD-10-CM | POA: Diagnosis present

## 2014-02-21 DIAGNOSIS — I2510 Atherosclerotic heart disease of native coronary artery without angina pectoris: Secondary | ICD-10-CM | POA: Diagnosis present

## 2014-02-21 DIAGNOSIS — K219 Gastro-esophageal reflux disease without esophagitis: Secondary | ICD-10-CM | POA: Diagnosis present

## 2014-02-21 DIAGNOSIS — F411 Generalized anxiety disorder: Secondary | ICD-10-CM | POA: Diagnosis present

## 2014-02-21 DIAGNOSIS — Z9861 Coronary angioplasty status: Secondary | ICD-10-CM

## 2014-02-21 DIAGNOSIS — Z79899 Other long term (current) drug therapy: Secondary | ICD-10-CM

## 2014-02-21 DIAGNOSIS — I5032 Chronic diastolic (congestive) heart failure: Secondary | ICD-10-CM | POA: Diagnosis present

## 2014-02-21 DIAGNOSIS — Z8542 Personal history of malignant neoplasm of other parts of uterus: Secondary | ICD-10-CM

## 2014-02-21 DIAGNOSIS — F17200 Nicotine dependence, unspecified, uncomplicated: Secondary | ICD-10-CM | POA: Diagnosis present

## 2014-02-21 DIAGNOSIS — N183 Chronic kidney disease, stage 3 unspecified: Secondary | ICD-10-CM | POA: Diagnosis present

## 2014-02-21 DIAGNOSIS — I700 Atherosclerosis of aorta: Secondary | ICD-10-CM | POA: Diagnosis present

## 2014-02-21 DIAGNOSIS — I509 Heart failure, unspecified: Secondary | ICD-10-CM | POA: Diagnosis present

## 2014-02-21 DIAGNOSIS — Z7902 Long term (current) use of antithrombotics/antiplatelets: Secondary | ICD-10-CM

## 2014-02-21 DIAGNOSIS — I252 Old myocardial infarction: Secondary | ICD-10-CM

## 2014-02-21 DIAGNOSIS — M316 Other giant cell arteritis: Secondary | ICD-10-CM | POA: Diagnosis present

## 2014-02-21 DIAGNOSIS — IMO0002 Reserved for concepts with insufficient information to code with codable children: Secondary | ICD-10-CM

## 2014-02-21 DIAGNOSIS — Z7982 Long term (current) use of aspirin: Secondary | ICD-10-CM

## 2014-02-21 DIAGNOSIS — J449 Chronic obstructive pulmonary disease, unspecified: Secondary | ICD-10-CM | POA: Diagnosis present

## 2014-02-21 DIAGNOSIS — Z9981 Dependence on supplemental oxygen: Secondary | ICD-10-CM

## 2014-02-21 DIAGNOSIS — D649 Anemia, unspecified: Secondary | ICD-10-CM | POA: Diagnosis present

## 2014-02-21 DIAGNOSIS — E785 Hyperlipidemia, unspecified: Secondary | ICD-10-CM | POA: Diagnosis present

## 2014-02-21 DIAGNOSIS — E876 Hypokalemia: Secondary | ICD-10-CM | POA: Diagnosis present

## 2014-02-21 DIAGNOSIS — T40605A Adverse effect of unspecified narcotics, initial encounter: Secondary | ICD-10-CM | POA: Diagnosis present

## 2014-02-21 DIAGNOSIS — I739 Peripheral vascular disease, unspecified: Secondary | ICD-10-CM | POA: Diagnosis present

## 2014-02-21 DIAGNOSIS — J9610 Chronic respiratory failure, unspecified whether with hypoxia or hypercapnia: Secondary | ICD-10-CM | POA: Diagnosis present

## 2014-02-21 DIAGNOSIS — K5900 Constipation, unspecified: Secondary | ICD-10-CM | POA: Diagnosis present

## 2014-02-21 NOTE — ED Notes (Signed)
Pt. reports chronic constipation for 8 days , seen here yesterday for the same complaint , abdominal x-ray done /discharged home with prescription laxatives . Pt. stated she took it ( Golytely ) this evening with no relief . Slight nausea.

## 2014-02-22 ENCOUNTER — Inpatient Hospital Stay (HOSPITAL_COMMUNITY)
Admission: EM | Admit: 2014-02-22 | Discharge: 2014-02-25 | DRG: 389 | Disposition: A | Discharge status: Home / Self Care | Payer: Medicare (Managed Care) | Attending: Internal Medicine | Admitting: Internal Medicine

## 2014-02-22 ENCOUNTER — Emergency Department (HOSPITAL_COMMUNITY): Payer: Medicare (Managed Care)

## 2014-02-22 ENCOUNTER — Inpatient Hospital Stay (HOSPITAL_COMMUNITY): Payer: Medicare (Managed Care)

## 2014-02-22 ENCOUNTER — Encounter (HOSPITAL_COMMUNITY): Payer: Self-pay | Admitting: Internal Medicine

## 2014-02-22 DIAGNOSIS — Z720 Tobacco use: Secondary | ICD-10-CM

## 2014-02-22 DIAGNOSIS — K5900 Constipation, unspecified: Secondary | ICD-10-CM

## 2014-02-22 DIAGNOSIS — N183 Chronic kidney disease, stage 3 unspecified: Secondary | ICD-10-CM | POA: Diagnosis present

## 2014-02-22 DIAGNOSIS — I2510 Atherosclerotic heart disease of native coronary artery without angina pectoris: Secondary | ICD-10-CM | POA: Diagnosis present

## 2014-02-22 DIAGNOSIS — D649 Anemia, unspecified: Secondary | ICD-10-CM | POA: Diagnosis present

## 2014-02-22 DIAGNOSIS — F17200 Nicotine dependence, unspecified, uncomplicated: Secondary | ICD-10-CM | POA: Diagnosis present

## 2014-02-22 DIAGNOSIS — J9610 Chronic respiratory failure, unspecified whether with hypoxia or hypercapnia: Secondary | ICD-10-CM | POA: Diagnosis present

## 2014-02-22 DIAGNOSIS — IMO0002 Reserved for concepts with insufficient information to code with codable children: Secondary | ICD-10-CM | POA: Diagnosis not present

## 2014-02-22 DIAGNOSIS — E876 Hypokalemia: Secondary | ICD-10-CM | POA: Diagnosis present

## 2014-02-22 DIAGNOSIS — E871 Hypo-osmolality and hyponatremia: Secondary | ICD-10-CM

## 2014-02-22 DIAGNOSIS — Z7902 Long term (current) use of antithrombotics/antiplatelets: Secondary | ICD-10-CM | POA: Diagnosis not present

## 2014-02-22 DIAGNOSIS — K219 Gastro-esophageal reflux disease without esophagitis: Secondary | ICD-10-CM | POA: Diagnosis present

## 2014-02-22 DIAGNOSIS — K56609 Unspecified intestinal obstruction, unspecified as to partial versus complete obstruction: Principal | ICD-10-CM

## 2014-02-22 DIAGNOSIS — R531 Weakness: Secondary | ICD-10-CM

## 2014-02-22 DIAGNOSIS — I739 Peripheral vascular disease, unspecified: Secondary | ICD-10-CM | POA: Diagnosis present

## 2014-02-22 DIAGNOSIS — T40605A Adverse effect of unspecified narcotics, initial encounter: Secondary | ICD-10-CM | POA: Diagnosis present

## 2014-02-22 DIAGNOSIS — Z9981 Dependence on supplemental oxygen: Secondary | ICD-10-CM | POA: Diagnosis not present

## 2014-02-22 DIAGNOSIS — F419 Anxiety disorder, unspecified: Secondary | ICD-10-CM

## 2014-02-22 DIAGNOSIS — E785 Hyperlipidemia, unspecified: Secondary | ICD-10-CM | POA: Diagnosis present

## 2014-02-22 DIAGNOSIS — I129 Hypertensive chronic kidney disease with stage 1 through stage 4 chronic kidney disease, or unspecified chronic kidney disease: Secondary | ICD-10-CM | POA: Diagnosis present

## 2014-02-22 DIAGNOSIS — J449 Chronic obstructive pulmonary disease, unspecified: Secondary | ICD-10-CM | POA: Diagnosis present

## 2014-02-22 DIAGNOSIS — J438 Other emphysema: Secondary | ICD-10-CM

## 2014-02-22 DIAGNOSIS — F411 Generalized anxiety disorder: Secondary | ICD-10-CM | POA: Diagnosis present

## 2014-02-22 DIAGNOSIS — I509 Heart failure, unspecified: Secondary | ICD-10-CM | POA: Diagnosis present

## 2014-02-22 DIAGNOSIS — N189 Chronic kidney disease, unspecified: Secondary | ICD-10-CM

## 2014-02-22 DIAGNOSIS — D72829 Elevated white blood cell count, unspecified: Secondary | ICD-10-CM

## 2014-02-22 DIAGNOSIS — I5032 Chronic diastolic (congestive) heart failure: Secondary | ICD-10-CM | POA: Diagnosis present

## 2014-02-22 DIAGNOSIS — M316 Other giant cell arteritis: Secondary | ICD-10-CM | POA: Diagnosis present

## 2014-02-22 DIAGNOSIS — I10 Essential (primary) hypertension: Secondary | ICD-10-CM | POA: Diagnosis present

## 2014-02-22 DIAGNOSIS — I252 Old myocardial infarction: Secondary | ICD-10-CM | POA: Diagnosis not present

## 2014-02-22 DIAGNOSIS — J9611 Chronic respiratory failure with hypoxia: Secondary | ICD-10-CM

## 2014-02-22 DIAGNOSIS — Z8542 Personal history of malignant neoplasm of other parts of uterus: Secondary | ICD-10-CM | POA: Diagnosis not present

## 2014-02-22 DIAGNOSIS — N289 Disorder of kidney and ureter, unspecified: Secondary | ICD-10-CM

## 2014-02-22 DIAGNOSIS — N179 Acute kidney failure, unspecified: Secondary | ICD-10-CM

## 2014-02-22 DIAGNOSIS — I700 Atherosclerosis of aorta: Secondary | ICD-10-CM | POA: Diagnosis present

## 2014-02-22 DIAGNOSIS — R000 Tachycardia, unspecified: Secondary | ICD-10-CM

## 2014-02-22 DIAGNOSIS — Z9861 Coronary angioplasty status: Secondary | ICD-10-CM | POA: Diagnosis not present

## 2014-02-22 DIAGNOSIS — Z7982 Long term (current) use of aspirin: Secondary | ICD-10-CM | POA: Diagnosis not present

## 2014-02-22 DIAGNOSIS — I4510 Unspecified right bundle-branch block: Secondary | ICD-10-CM | POA: Diagnosis present

## 2014-02-22 DIAGNOSIS — Z79899 Other long term (current) drug therapy: Secondary | ICD-10-CM | POA: Diagnosis not present

## 2014-02-22 LAB — BASIC METABOLIC PANEL
Anion gap: 17 — ABNORMAL HIGH (ref 5–15)
BUN: 38 mg/dL — ABNORMAL HIGH (ref 6–23)
CO2: 36 meq/L — AB (ref 19–32)
CREATININE: 1.89 mg/dL — AB (ref 0.50–1.10)
Calcium: 8.7 mg/dL (ref 8.4–10.5)
Chloride: 92 mEq/L — ABNORMAL LOW (ref 96–112)
GFR calc Af Amer: 29 mL/min — ABNORMAL LOW (ref >90)
GFR, EST NON AFRICAN AMERICAN: 25 mL/min — AB (ref >90)
GLUCOSE: 138 mg/dL — AB (ref 70–99)
Potassium: 2.8 mEq/L — CL (ref 3.7–5.3)
Sodium: 145 mEq/L (ref 137–147)

## 2014-02-22 LAB — CBC WITH DIFFERENTIAL/PLATELET
Basophils Absolute: 0 10*3/uL (ref 0.0–0.1)
Basophils Relative: 0 % (ref 0–1)
EOS PCT: 0 % (ref 0–5)
Eosinophils Absolute: 0 10*3/uL (ref 0.0–0.7)
HEMATOCRIT: 33.6 % — AB (ref 36.0–46.0)
HEMOGLOBIN: 10.6 g/dL — AB (ref 12.0–15.0)
LYMPHS ABS: 0.9 10*3/uL (ref 0.7–4.0)
LYMPHS PCT: 6 % — AB (ref 12–46)
MCH: 31.1 pg (ref 26.0–34.0)
MCHC: 31.5 g/dL (ref 30.0–36.0)
MCV: 98.5 fL (ref 78.0–100.0)
MONOS PCT: 2 % — AB (ref 3–12)
Monocytes Absolute: 0.4 10*3/uL (ref 0.1–1.0)
Neutro Abs: 14.7 10*3/uL — ABNORMAL HIGH (ref 1.7–7.7)
Neutrophils Relative %: 92 % — ABNORMAL HIGH (ref 43–77)
PLATELETS: 213 10*3/uL (ref 150–400)
RBC: 3.41 MIL/uL — AB (ref 3.87–5.11)
RDW: 17.7 % — ABNORMAL HIGH (ref 11.5–15.5)
WBC: 16 10*3/uL — ABNORMAL HIGH (ref 4.0–10.5)

## 2014-02-22 LAB — URINE MICROSCOPIC-ADD ON

## 2014-02-22 LAB — URINALYSIS, ROUTINE W REFLEX MICROSCOPIC
Bilirubin Urine: NEGATIVE
GLUCOSE, UA: NEGATIVE mg/dL
Ketones, ur: NEGATIVE mg/dL
LEUKOCYTES UA: NEGATIVE
Nitrite: NEGATIVE
PH: 6 (ref 5.0–8.0)
Protein, ur: NEGATIVE mg/dL
Specific Gravity, Urine: 1.015 (ref 1.005–1.030)
Urobilinogen, UA: 0.2 mg/dL (ref 0.0–1.0)

## 2014-02-22 LAB — TSH: TSH: 2.34 u[IU]/mL (ref 0.350–4.500)

## 2014-02-22 LAB — URINE CULTURE
COLONY COUNT: NO GROWTH
CULTURE: NO GROWTH
Special Requests: NORMAL

## 2014-02-22 LAB — I-STAT CG4 LACTIC ACID, ED: Lactic Acid, Venous: 0.62 mmol/L (ref 0.5–2.2)

## 2014-02-22 LAB — MAGNESIUM: MAGNESIUM: 1.9 mg/dL (ref 1.5–2.5)

## 2014-02-22 MED ORDER — MAGNESIUM HYDROXIDE 400 MG/5ML PO SUSP
15.0000 mL | Freq: Once | ORAL | Status: AC
  Administered 2014-02-22: 15 mL via ORAL
  Filled 2014-02-22: qty 30

## 2014-02-22 MED ORDER — BISACODYL 5 MG PO TBEC
5.0000 mg | DELAYED_RELEASE_TABLET | Freq: Once | ORAL | Status: AC
  Administered 2014-02-22: 5 mg via ORAL
  Filled 2014-02-22 (×2): qty 1

## 2014-02-22 MED ORDER — PREDNISONE 20 MG PO TABS
20.0000 mg | ORAL_TABLET | Freq: Every day | ORAL | Status: DC
  Administered 2014-02-22 – 2014-02-24 (×3): 20 mg via ORAL
  Filled 2014-02-22 (×5): qty 1

## 2014-02-22 MED ORDER — SODIUM CHLORIDE 0.9 % IJ SOLN
3.0000 mL | Freq: Two times a day (BID) | INTRAMUSCULAR | Status: DC
  Administered 2014-02-22 – 2014-02-25 (×7): 3 mL via INTRAVENOUS

## 2014-02-22 MED ORDER — ASPIRIN 81 MG PO CHEW
81.0000 mg | CHEWABLE_TABLET | Freq: Every day | ORAL | Status: DC
  Administered 2014-02-22 – 2014-02-25 (×4): 81 mg via ORAL
  Filled 2014-02-22 (×4): qty 1

## 2014-02-22 MED ORDER — POTASSIUM CHLORIDE CRYS ER 20 MEQ PO TBCR
40.0000 meq | EXTENDED_RELEASE_TABLET | ORAL | Status: AC
  Administered 2014-02-22 (×3): 40 meq via ORAL
  Filled 2014-02-22 (×3): qty 2

## 2014-02-22 MED ORDER — HEPARIN SODIUM (PORCINE) 5000 UNIT/ML IJ SOLN
5000.0000 [IU] | Freq: Three times a day (TID) | INTRAMUSCULAR | Status: DC
  Administered 2014-02-22 – 2014-02-25 (×10): 5000 [IU] via SUBCUTANEOUS
  Filled 2014-02-22 (×11): qty 1

## 2014-02-22 MED ORDER — DILTIAZEM HCL ER 240 MG PO CP24
240.0000 mg | ORAL_CAPSULE | Freq: Every day | ORAL | Status: DC
  Administered 2014-02-23 – 2014-02-25 (×3): 240 mg via ORAL
  Filled 2014-02-22 (×3): qty 1

## 2014-02-22 MED ORDER — BUDESONIDE 0.5 MG/2ML IN SUSP
0.5000 mg | Freq: Two times a day (BID) | RESPIRATORY_TRACT | Status: DC | PRN
  Filled 2014-02-22: qty 2

## 2014-02-22 MED ORDER — ATORVASTATIN CALCIUM 40 MG PO TABS
40.0000 mg | ORAL_TABLET | Freq: Every day | ORAL | Status: DC
  Administered 2014-02-22 – 2014-02-24 (×3): 40 mg via ORAL
  Filled 2014-02-22 (×4): qty 1

## 2014-02-22 MED ORDER — ARFORMOTEROL TARTRATE 15 MCG/2ML IN NEBU
15.0000 ug | INHALATION_SOLUTION | Freq: Every day | RESPIRATORY_TRACT | Status: DC
  Administered 2014-02-23 – 2014-02-25 (×3): 15 ug via RESPIRATORY_TRACT
  Filled 2014-02-22 (×4): qty 2

## 2014-02-22 MED ORDER — ONDANSETRON HCL 4 MG/2ML IJ SOLN: 4.0000 mg | Freq: Four times a day (QID) | INTRAMUSCULAR | Status: DC | PRN

## 2014-02-22 MED ORDER — VENLAFAXINE HCL ER 75 MG PO CP24
75.0000 mg | ORAL_CAPSULE | Freq: Every day | ORAL | Status: DC
  Administered 2014-02-23 – 2014-02-25 (×3): 75 mg via ORAL
  Filled 2014-02-22 (×3): qty 1

## 2014-02-22 MED ORDER — NITROGLYCERIN 0.4 MG SL SUBL: 0.4000 mg | SUBLINGUAL_TABLET | SUBLINGUAL | Status: DC | PRN

## 2014-02-22 MED ORDER — IOHEXOL 300 MG/ML  SOLN
150.0000 mL | Freq: Once | INTRAMUSCULAR | Status: AC | PRN
  Administered 2014-02-22: 1 mL via ORAL

## 2014-02-22 MED ORDER — ALBUTEROL SULFATE HFA 108 (90 BASE) MCG/ACT IN AERS
2.0000 | INHALATION_SPRAY | RESPIRATORY_TRACT | Status: DC | PRN
Start: 2014-02-22 — End: 2014-02-22

## 2014-02-22 MED ORDER — SORBITOL 70 % SOLN
960.0000 mL | TOPICAL_OIL | Freq: Once | ORAL | Status: AC
  Administered 2014-02-22: 960 mL via RECTAL
  Filled 2014-02-22: qty 240

## 2014-02-22 MED ORDER — BARIUM SULFATE 2.1 % PO SUSP: 900.0000 mL | ORAL | Status: DC

## 2014-02-22 MED ORDER — ASPIRIN 81 MG PO TABS: 81.0000 mg | ORAL_TABLET | Freq: Every day | ORAL | Status: DC

## 2014-02-22 MED ORDER — METHOCARBAMOL 500 MG PO TABS
500.0000 mg | ORAL_TABLET | Freq: Once | ORAL | Status: AC
  Administered 2014-02-22: 500 mg via ORAL
  Filled 2014-02-22: qty 1

## 2014-02-22 MED ORDER — FLEET ENEMA 7-19 GM/118ML RE ENEM
1.0000 | ENEMA | Freq: Once | RECTAL | Status: AC
  Administered 2014-02-22: 01:00:00 via RECTAL
  Filled 2014-02-22: qty 1

## 2014-02-22 MED ORDER — NICOTINE 14 MG/24HR TD PT24
14.0000 mg | MEDICATED_PATCH | Freq: Every day | TRANSDERMAL | Status: DC
  Administered 2014-02-22 – 2014-02-25 (×4): 14 mg via TRANSDERMAL
  Filled 2014-02-22 (×4): qty 1

## 2014-02-22 MED ORDER — ACETAMINOPHEN 650 MG RE SUPP: 650.0000 mg | Freq: Four times a day (QID) | RECTAL | Status: DC | PRN

## 2014-02-22 MED ORDER — PREDNISONE 20 MG PO TABS
20.0000 mg | ORAL_TABLET | Freq: Every day | ORAL | Status: DC
  Filled 2014-02-22: qty 1

## 2014-02-22 MED ORDER — PANTOPRAZOLE SODIUM 40 MG PO TBEC
40.0000 mg | DELAYED_RELEASE_TABLET | Freq: Every day | ORAL | Status: DC
  Administered 2014-02-22 – 2014-02-25 (×4): 40 mg via ORAL
  Filled 2014-02-22 (×4): qty 1

## 2014-02-22 MED ORDER — MINERAL OIL RE ENEM
1.0000 | ENEMA | Freq: Once | RECTAL | Status: AC
  Administered 2014-02-22: 1 via RECTAL
  Filled 2014-02-22: qty 1

## 2014-02-22 MED ORDER — POTASSIUM CHLORIDE 10 MEQ/100ML IV SOLN
10.0000 meq | INTRAVENOUS | Status: DC
  Administered 2014-02-22: 10 meq via INTRAVENOUS
  Filled 2014-02-22 (×2): qty 100

## 2014-02-22 MED ORDER — ALLOPURINOL 100 MG PO TABS
100.0000 mg | ORAL_TABLET | Freq: Two times a day (BID) | ORAL | Status: DC
  Administered 2014-02-22 – 2014-02-25 (×7): 100 mg via ORAL
  Filled 2014-02-22 (×9): qty 1

## 2014-02-22 MED ORDER — CALCITRIOL 0.25 MCG PO CAPS
0.2500 ug | ORAL_CAPSULE | Freq: Every day | ORAL | Status: DC
  Administered 2014-02-22 – 2014-02-25 (×4): 0.25 ug via ORAL
  Filled 2014-02-22 (×4): qty 1

## 2014-02-22 MED ORDER — ACETAMINOPHEN 325 MG PO TABS
650.0000 mg | ORAL_TABLET | Freq: Four times a day (QID) | ORAL | Status: DC | PRN
  Administered 2014-02-22 – 2014-02-25 (×6): 650 mg via ORAL
  Filled 2014-02-22 (×6): qty 2

## 2014-02-22 MED ORDER — VITAMIN B-12 1000 MCG PO TABS
1000.0000 ug | ORAL_TABLET | Freq: Every day | ORAL | Status: DC
  Administered 2014-02-22 – 2014-02-25 (×4): 1000 ug via ORAL
  Filled 2014-02-22 (×5): qty 1

## 2014-02-22 MED ORDER — TIOTROPIUM BROMIDE MONOHYDRATE 18 MCG IN CAPS
18.0000 ug | ORAL_CAPSULE | Freq: Every day | RESPIRATORY_TRACT | Status: DC
  Administered 2014-02-23 – 2014-02-25 (×3): 18 ug via RESPIRATORY_TRACT
  Filled 2014-02-22: qty 5

## 2014-02-22 MED ORDER — METHOCARBAMOL 500 MG PO TABS
500.0000 mg | ORAL_TABLET | Freq: Two times a day (BID) | ORAL | Status: DC | PRN
  Administered 2014-02-22 – 2014-02-25 (×2): 500 mg via ORAL
  Filled 2014-02-22 (×3): qty 1

## 2014-02-22 MED ORDER — ONDANSETRON 4 MG PO TBDP
4.0000 mg | ORAL_TABLET | Freq: Once | ORAL | Status: AC
  Administered 2014-02-22: 4 mg via ORAL
  Filled 2014-02-22: qty 1

## 2014-02-22 MED ORDER — POTASSIUM CHLORIDE 10 MEQ/100ML IV SOLN
10.0000 meq | Freq: Once | INTRAVENOUS | Status: AC
  Administered 2014-02-22: 10 meq via INTRAVENOUS
  Filled 2014-02-22: qty 100

## 2014-02-22 MED ORDER — POLYETHYLENE GLYCOL 3350 17 G PO PACK
34.0000 g | PACK | Freq: Every day | ORAL | Status: DC
  Administered 2014-02-22 – 2014-02-23 (×2): 34 g via ORAL
  Filled 2014-02-22: qty 1
  Filled 2014-02-22 (×2): qty 2

## 2014-02-22 MED ORDER — LACTATED RINGERS IV BOLUS (SEPSIS)
1000.0000 mL | Freq: Once | INTRAVENOUS | Status: AC
  Administered 2014-02-22: 1000 mL via INTRAVENOUS

## 2014-02-22 MED ORDER — ONDANSETRON HCL 4 MG PO TABS: 4.0000 mg | ORAL_TABLET | Freq: Four times a day (QID) | ORAL | Status: DC | PRN

## 2014-02-22 MED ORDER — ALBUTEROL SULFATE (2.5 MG/3ML) 0.083% IN NEBU
2.5000 mg | INHALATION_SOLUTION | RESPIRATORY_TRACT | Status: DC | PRN
Start: 2014-02-22 — End: 2014-02-25
  Administered 2014-02-23 – 2014-02-24 (×2): 2.5 mg via RESPIRATORY_TRACT
  Filled 2014-02-22 (×3): qty 3

## 2014-02-22 NOTE — ED Provider Notes (Signed)
CSN: [id number]     Arrival date & time 02/21/14  2142 History   First MD Initiated Contact with Patient 02/22/14 0047     Chief Complaint  Patient presents with  . Constipation     (Consider location/radiation/quality/duration/timing/severity/associated sxs/prior Treatment) HPI Pt is a 73yo female with hx of chronic constipation presenting to ED with c/o constipation x8 days.  States she was seen here yesterday for same complaint where pt had an abdominal x-ray which showed prominent stool throughout the colon favors constipation.  Pt was discharged home and advised to take Golytely but states she has been taking it all day w/o any relief.  Pt c/o mild nausea but no vomiting.  Pt also c/o diffuse abdominal pain that is cramping,  7/10. Pt states yesterday pain was more in her rectum, today, it is more in her abdomen. Pt states she does have suppositories at home as well but has not tried them yet. Husband states it is difficult to have pt lie down for enema, then have her get back up to the commode in time for a BM. Denies fever or chills.  No blood in stool. Denies urinary symptoms.   Past Medical History  Diagnosis Date  . CAD (coronary artery disease)   . Atherosclerosis of abdominal aorta   . On home oxygen therapy     "3L; 24/7" (10/03/2013)  . Tobacco abuse   . HTN (hypertension)   . COPD (chronic obstructive pulmonary disease)   . PVD (peripheral vascular disease)   . Anxiety   . GERD (gastroesophageal reflux disease)   . RBBB (right bundle branch block)   . Tachycardia   . High cholesterol   . Leukocytosis   . CHF (congestive heart failure)   . Shortness of breath   . UTI (urinary tract infection)   . CKD (chronic kidney disease), stage IV   . Temporal arteritis dx'd 08/2013  . Endometrial carcinoma   . Uterine cancer   . Myocardial infarction ~ 2011    " MILD "  . Anemia   . History of blood transfusion     "S/P anemia for 3 years"  . Daily headache     "last few  days" (10/03/2013)  . Arthritis     "hands and fingers" (10/03/2013)  . Chronic lower back pain   . Depression     "sometimes; not often" (10/03/2013)  . Lower GI bleeding 02/2012; 10/03/2013   Past Surgical History  Procedure Laterality Date  . Carotid stent    . Tonsillectomy    . Forearm fracture surgery Left ~ 2005    PLATES AND SCREWS  . Femoral artery stent    . Reduction mammaplasty    . Wrist fracture surgery Left ~ 2005    "it was crushed; they glued it together"  . Coronary angioplasty with stent placement      "1 + +1"  . Iliac artery stent Bilateral   . Esophagogastroduodenoscopy N/A 10/04/2013    Procedure: ESOPHAGOGASTRODUODENOSCOPY (EGD);  Surgeon: Beryle Beams, MD;  Location: [facility] ENDOSCOPY;  Service: Endoscopy;  Laterality: N/A;  . Vaginal hysterectomy      for cancer   Family History  Problem Relation Age of Onset  . Heart attack Father   . Coronary artery disease Father   . Leukemia Mother   . Leukemia Brother    History  Substance Use Topics  . Smoking status: Current Every Day Smoker -- 0.50 packs/day for 56 years  Types: Cigarettes  . Smokeless tobacco: Never Used  . Alcohol Use: Yes     Comment: 10/03/2013 "I drink 2-3 times/yr"   OB History   Grav Para Term Preterm Abortions TAB SAB Ect Mult Living                 Review of Systems  Constitutional: Negative for fever and chills.  Respiratory: Negative for cough and shortness of breath.   Cardiovascular: Negative for chest pain and palpitations.  Gastrointestinal: Positive for nausea, abdominal pain and constipation. Negative for vomiting, diarrhea and rectal pain.  All other systems reviewed and are negative.     Allergies  Uloric; Zolpidem tartrate; Ciprofloxacin; Contrast media; Tetracycline; and Vicodin  Home Medications   Prior to Admission medications   Medication Sig Start Date End Date Taking? Authorizing Provider  albuterol (PROAIR HFA) 108 (90 BASE) MCG/ACT inhaler Inhale 2  puffs into the lungs every 4 (four) hours as needed for wheezing or shortness of breath.   Yes Historical Provider, MD  allopurinol (ZYLOPRIM) 100 MG tablet Take 100 mg by mouth 2 (two) times daily.     Yes Historical Provider, MD  arformoterol (BROVANA) 15 MCG/2ML NEBU Take 15 mcg by nebulization daily.   Yes Historical Provider, MD  aspirin 81 MG tablet Take 1 tablet (81 mg total) by mouth daily. 10/17/13  Yes Charlynne Cousins, MD  budesonide (PULMICORT) 0.5 MG/2ML nebulizer solution Take 0.5 mg by nebulization 2 (two) times daily as needed (shortness of breath).   Yes Historical Provider, MD  calcitRIOL (ROCALTROL) 0.25 MCG capsule Take 0.25 mcg by mouth daily.   Yes Historical Provider, MD  clopidogrel (PLAVIX) 75 MG tablet Take 1 tablet (75 mg total) by mouth daily. 10/17/13  Yes Charlynne Cousins, MD  diltiazem (DILACOR XR) 240 MG 24 hr capsule Take 1 capsule (240 mg total) by mouth daily. 07/15/13  Yes Thayer Headings, MD  famotidine (PEPCID) 20 MG tablet Take 20 mg by mouth 2 (two) times daily as needed for heartburn.    Yes Historical Provider, MD  ferrous sulfate 325 (65 FE) MG tablet Take 1 tablet (325 mg total) by mouth daily with breakfast. 10/05/13  Yes Charlynne Cousins, MD  furosemide (LASIX) 80 MG tablet Take 80-160 mg by mouth 2 (two) times daily. 160mg  in the morning and 80mg  in the evening   Yes Historical Provider, MD  methocarbamol (ROBAXIN) 500 MG tablet Take 1 tablet (500 mg total) by mouth 2 (two) times daily as needed for muscle spasms. 02/10/14  Yes Viona Gilmore Cartner, PA-C  nitroGLYCERIN (NITROSTAT) 0.4 MG SL tablet Place 0.4 mg under the tongue every 5 (five) minutes as needed for chest pain.   Yes Historical Provider, MD  oxyCODONE-acetaminophen (PERCOCET/ROXICET) 5-325 MG per tablet Take 1 tablet by mouth every 4 (four) hours as needed for moderate pain or severe pain.   Yes Historical Provider, MD  pantoprazole (PROTONIX) 40 MG tablet Take 40 mg by mouth daily.   Yes  Historical Provider, MD  polyethylene glycol (MIRALAX / GLYCOLAX) packet Take 34 g by mouth daily as needed for mild constipation.   Yes Historical Provider, MD  predniSONE (DELTASONE) 20 MG tablet Take 20 mg by mouth daily with breakfast.    Yes Historical Provider, MD  Probiotic Product (ALIGN PO) Take 1 tablet by mouth daily.    Yes Historical Provider, MD  rosuvastatin (CRESTOR) 20 MG tablet Take 1 tablet (20 mg total) by mouth daily. 07/15/13  Yes  Thayer Headings, MD  tiotropium (SPIRIVA) 18 MCG inhalation capsule Place 18 mcg into inhaler and inhale daily.   Yes Historical Provider, MD  traMADol (ULTRAM) 50 MG tablet Take 50 mg by mouth every 6 (six) hours as needed for moderate pain.    Yes Historical Provider, MD  venlafaxine XR (EFFEXOR-XR) 75 MG 24 hr capsule Take 75 mg by mouth daily.    Yes Historical Provider, MD  vitamin B-12 (CYANOCOBALAMIN) 1000 MCG tablet Take 1,000 mcg by mouth daily.   Yes Historical Provider, MD  polyethylene glycol (GOLYTELY) 236 G solution Take 4,000 mLs by mouth once. 02/20/14   Merryl Hacker, MD  sodium phosphate (FLEET) 7-19 GM/118ML ENEM Place 133 mLs (1 enema total) rectally daily as needed for severe constipation. 02/20/14   Merryl Hacker, MD   BP 128/102  Pulse 103  Temp(Src) 98.3 F (36.8 C) (Oral)  Resp 21  SpO2 92% Physical Exam  Nursing note and vitals reviewed. Constitutional: She appears well-developed and well-nourished. No distress.  HENT:  Head: Normocephalic and atraumatic.  Eyes: Conjunctivae are normal. No scleral icterus.  Neck: Normal range of motion.  Cardiovascular: Normal rate, regular rhythm and normal heart sounds.   Pulmonary/Chest: Effort normal and breath sounds normal. No respiratory distress. She has no wheezes. She has no rales. She exhibits no tenderness.  Abdominal: Soft. Bowel sounds are normal. She exhibits no distension and no mass. There is no tenderness. There is no rebound and no guarding.  Genitourinary:   Chaperoned exam.  Rectal exam: soft brown stool palpated in rectum, minimal stool able to be removed. No gross blood.   Musculoskeletal: Normal range of motion.  Neurological: She is alert.  Skin: Skin is warm and dry. She is not diaphoretic.    ED Course  Procedures (including critical care time) Labs Review Labs Reviewed  CBC WITH DIFFERENTIAL - Abnormal; Notable for the following:    WBC 16.0 (*)    RBC 3.41 (*)    Hemoglobin 10.6 (*)    HCT 33.6 (*)    RDW 17.7 (*)    Neutrophils Relative % 92 (*)    Neutro Abs 14.7 (*)    Lymphocytes Relative 6 (*)    Monocytes Relative 2 (*)    All other components within normal limits  BASIC METABOLIC PANEL - Abnormal; Notable for the following:    Potassium 2.8 (*)    Chloride 92 (*)    CO2 36 (*)    Glucose, Bld 138 (*)    BUN 38 (*)    Creatinine, Ser 1.89 (*)    GFR calc non Af Amer 25 (*)    GFR calc Af Amer 29 (*)    Anion gap 17 (*)    All other components within normal limits  URINE CULTURE  URINALYSIS, ROUTINE W REFLEX MICROSCOPIC  I-STAT CG4 LACTIC ACID, ED    Imaging Review Dg Abd 1 View  02/20/2014   CLINICAL DATA:  Decreased urine output. History of uterine cancer. Constipation.  EXAM: ABDOMEN - 1 VIEW  COMPARISON:  11/01/2012  FINDINGS: Right common and left external iliac artery stents. Atherosclerotic vascular calcifications.  Prominent stool throughout the colon favors constipation. No dilated small bowel observed. No compelling findings of renal or ureteral calculi.  IMPRESSION: 1.  Prominent stool throughout the colon favors constipation. 2. Atherosclerosis with iliac stents bilaterally.   Electronically Signed   By: Sherryl Barters M.D.   On: 02/20/2014 15:57     EKG  Interpretation None      MDM   Final diagnoses:  None    Pt is a 57y female with hx of chronic constipation presenting to ED with c/o continued constipation and abdominal pain. Pt was evaluated yesterday for same, which included an  abdominal plain film significant for prominent stool in colon, negative for evidence of SBO.  Pt was given dulcolax and FLEET enema with 3 small BMs in ED.  Pt still c/o abdominal pain, stating she "did not have much success"  Pt then given milk of magnesia and mineral oil enema w/o relief.    Discussed pt with Dr. Kathrynn Humble who also examined pt. Will get labs: CBC, BMP, and UA, then repeat acute abdominal series.  If unremarkable, will discharge pt home.   Labs: pt has leukocytosis, WBC-16 Acute abdominal series significant for paucity of bowel gas, nonspecific bowel gas pattern. Fluid filled SBO not excluded.  Will get CT abdomen/pelvis.  6:21 AM Pt signed out to [employer], PA-C at shift change. Plan is to f/u with CT abd, will likely show some sort of bowel obstruction, and will need to be admitted. Pt also found to be hypokalemic.  IV potassium ordered.      Noland Fordyce, PA-C 02/22/14 [phone]

## 2014-02-22 NOTE — ED Provider Notes (Signed)
6:16 AM Constipation x 8 days. Pending CT to eval for bowel obstruction. No impaction on exam. Anticipate admission for intractable symptoms.   10:06 AM Pt and husband informed of CT results. Results reviewed with Dr. Stevie Kern. Patient continues to have no bowel throughput.   Her abd is mildly distended and soft at time of exam.   I spoke with Dr. Conley Canal who will admit the patient. Temp orders completed.   Carlisle Cater, PA-C 02/22/14 1007

## 2014-02-22 NOTE — ED Notes (Signed)
Attempted report 

## 2014-02-22 NOTE — Progress Notes (Signed)
Patient not able to tolerate IV potassium due to pain even with rate decreased to 40 mL/hr.  MD notified.

## 2014-02-22 NOTE — ED Provider Notes (Signed)
Medical screening examination/treatment/procedure(s) were conducted as a shared visit with non-physician practitioner(s) and myself.  I personally evaluated the patient during the encounter.   EKG Interpretation None      Pt comes in with cc of constipation. Hx of hysterectomy. Constipated for few days, not passing flatus now. AAS neg yday. Didn't respond to enema here. CT scan ordered - shows bowel obstruction. Will admit.  Varney Biles, MD 02/22/14 [phone]

## 2014-02-22 NOTE — ED Notes (Signed)
Patient transported to CT 

## 2014-02-22 NOTE — H&P (Signed)
[facility] History and Physical  Wanda Yang OJJ:[id number] DOB: [birth year] DOA: 02/22/2014  Referring physician: EDP PCP: Stephens Shire, MD   Chief Complaint: Constipation  HPI: Wanda Yang is a 73 y.o. female  With history of peripheral vascular disease, coronary artery disease, chronic kidney disease, tobacco abuse with COPD and chronic hypoxic respiratory failure, temporal arteritis who presents with constipation for 8 days. She was recently given a prescription for oxycodone for back pain. She has a history of chronic constipation. She was evaluated in the emergency room 2 days ago, x-ray showed prominent stool burden. She was given a prescription for GoLYTELY. She had no results. She returned to the emergency room. She received milk of magnesia enema, mineral oil enema, with minimal results. Eventually, CAT scan was done which showed possible colonic obstruction but no definite mass.  Her appetite has been poor and she has not been eating well. She feels weak. She has had EGD by Dr. Benson Norway and has seen Dr. Mickle Mallory in the office previously.  CT virtual colonoscopy from 2013 shows 2 foci of soft tissue density within the distal colon, could represent non-tach to stool or colonic polyps.  Also, her potassium is noted to be 2.8.  Review of Systems:  Systems reviewed. As above otherwise negative.  Past Medical History  Diagnosis Date  . CAD (coronary artery disease)   . Atherosclerosis of abdominal aorta   . On home oxygen therapy     "3L; 24/7" (10/03/2013)  . Tobacco abuse   . HTN (hypertension)   . COPD (chronic obstructive pulmonary disease)   . PVD (peripheral vascular disease)   . Anxiety   . GERD (gastroesophageal reflux disease)   . RBBB (right bundle branch block)   . Tachycardia   . High cholesterol   . Leukocytosis   . CHF (congestive heart failure)   . Shortness of breath   . UTI (urinary tract infection)   . CKD (chronic kidney disease), stage IV   .  Temporal arteritis dx'd 08/2013  . Endometrial carcinoma   . Uterine cancer   . Myocardial infarction ~ 2011    " MILD "  . Anemia   . History of blood transfusion     "S/P anemia for 3 years"  . Daily headache     "last few days" (10/03/2013)  . Arthritis     "hands and fingers" (10/03/2013)  . Chronic lower back pain   . Depression     "sometimes; not often" (10/03/2013)  . Lower GI bleeding 02/2012; 10/03/2013   Past Surgical History  Procedure Laterality Date  . Carotid stent    . Tonsillectomy    . Forearm fracture surgery Left ~ 2005    PLATES AND SCREWS  . Femoral artery stent    . Reduction mammaplasty    . Wrist fracture surgery Left ~ 2005    "it was crushed; they glued it together"  . Coronary angioplasty with stent placement      "1 + +1"  . Iliac artery stent Bilateral   . Esophagogastroduodenoscopy N/A 10/04/2013    Procedure: ESOPHAGOGASTRODUODENOSCOPY (EGD);  Surgeon: Beryle Beams, MD;  Location: [facility] ENDOSCOPY;  Service: Endoscopy;  Laterality: N/A;  . Vaginal hysterectomy      for cancer   Social History:  reports that she has been smoking Cigarettes.  She has a 28 pack-year smoking history. She has never used smokeless tobacco. She reports that she does not drink alcohol or use illicit drugs.  Allergies  Allergen Reactions  . Uloric [Febuxostat] Other (See Comments)    Unknown reaction  . Zolpidem Tartrate Other (See Comments)    hallucinations  . Ciprofloxacin Anxiety and Other (See Comments)    Shakiness  . Contrast Media [Iodinated Diagnostic Agents] Rash  . Tetracycline Rash  . Vicodin [Hydrocodone-Acetaminophen] Anxiety    Family History  Problem Relation Age of Onset  . Heart attack Father   . Coronary artery disease Father   . Leukemia Mother   . Leukemia Brother      Prior to Admission medications   Medication Sig Start Date End Date Taking? Authorizing Provider  albuterol (PROAIR HFA) 108 (90 BASE) MCG/ACT inhaler Inhale 2 puffs into the  lungs every 4 (four) hours as needed for wheezing or shortness of breath.   Yes Historical Provider, MD  allopurinol (ZYLOPRIM) 100 MG tablet Take 100 mg by mouth 2 (two) times daily.     Yes Historical Provider, MD  arformoterol (BROVANA) 15 MCG/2ML NEBU Take 15 mcg by nebulization daily.   Yes Historical Provider, MD  aspirin 81 MG tablet Take 1 tablet (81 mg total) by mouth daily. 10/17/13  Yes Charlynne Cousins, MD  budesonide (PULMICORT) 0.5 MG/2ML nebulizer solution Take 0.5 mg by nebulization 2 (two) times daily as needed (shortness of breath).   Yes Historical Provider, MD  calcitRIOL (ROCALTROL) 0.25 MCG capsule Take 0.25 mcg by mouth daily.   Yes Historical Provider, MD  clopidogrel (PLAVIX) 75 MG tablet Take 1 tablet (75 mg total) by mouth daily. 10/17/13  Yes Charlynne Cousins, MD  diltiazem (DILACOR XR) 240 MG 24 hr capsule Take 1 capsule (240 mg total) by mouth daily. 07/15/13  Yes Thayer Headings, MD  famotidine (PEPCID) 20 MG tablet Take 20 mg by mouth 2 (two) times daily as needed for heartburn.    Yes Historical Provider, MD  ferrous sulfate 325 (65 FE) MG tablet Take 1 tablet (325 mg total) by mouth daily with breakfast. 10/05/13  Yes Charlynne Cousins, MD  furosemide (LASIX) 80 MG tablet Take 80-160 mg by mouth 2 (two) times daily. 160mg  in the morning and 80mg  in the evening   Yes Historical Provider, MD  methocarbamol (ROBAXIN) 500 MG tablet Take 1 tablet (500 mg total) by mouth 2 (two) times daily as needed for muscle spasms. 02/10/14  Yes Viona Gilmore Cartner, PA-C  nitroGLYCERIN (NITROSTAT) 0.4 MG SL tablet Place 0.4 mg under the tongue every 5 (five) minutes as needed for chest pain.   Yes Historical Provider, MD  oxyCODONE-acetaminophen (PERCOCET/ROXICET) 5-325 MG per tablet Take 1 tablet by mouth every 4 (four) hours as needed for moderate pain or severe pain.   Yes Historical Provider, MD  pantoprazole (PROTONIX) 40 MG tablet Take 40 mg by mouth daily.   Yes Historical  Provider, MD  polyethylene glycol (MIRALAX / GLYCOLAX) packet Take 34 g by mouth daily as needed for mild constipation.   Yes Historical Provider, MD  predniSONE (DELTASONE) 20 MG tablet Take 20 mg by mouth daily with breakfast.    Yes Historical Provider, MD  Probiotic Product (ALIGN PO) Take 1 tablet by mouth daily.    Yes Historical Provider, MD  rosuvastatin (CRESTOR) 20 MG tablet Take 1 tablet (20 mg total) by mouth daily. 07/15/13  Yes Thayer Headings, MD  tiotropium (SPIRIVA) 18 MCG inhalation capsule Place 18 mcg into inhaler and inhale daily.   Yes Historical Provider, MD  traMADol (ULTRAM) 50 MG tablet Take 50  mg by mouth every 6 (six) hours as needed for moderate pain.    Yes Historical Provider, MD  venlafaxine XR (EFFEXOR-XR) 75 MG 24 hr capsule Take 75 mg by mouth daily.    Yes Historical Provider, MD  vitamin B-12 (CYANOCOBALAMIN) 1000 MCG tablet Take 1,000 mcg by mouth daily.   Yes Historical Provider, MD  polyethylene glycol (GOLYTELY) 236 G solution Take 4,000 mLs by mouth once. 02/20/14   Merryl Hacker, MD  sodium phosphate (FLEET) 7-19 GM/118ML ENEM Place 133 mLs (1 enema total) rectally daily as needed for severe constipation. 02/20/14   Merryl Hacker, MD   Physical Exam: Filed Vitals:   02/22/14 0850 02/22/14 0900 02/22/14 1000 02/22/14 1030  BP: 139/74 138/89 134/83 133/72  Pulse: 91 94 94 94  Temp:      TempSrc:      Resp: 21 22    SpO2: 93% 92% 89% 89%    Wt Readings from Last 3 Encounters:  02/05/14 72.576 kg (160 lb)  01/20/14 72.176 kg (159 lb 1.9 oz)  11/12/13 76.204 kg (168 lb)  BP 133/72  Pulse 94  Temp(Src) 98.3 F (36.8 C) (Oral)  Resp 22  Ht 5\' 8"  (1.727 m)  Wt 73.619 kg (162 lb 4.8 oz)  BMI 24.68 kg/m2  SpO2 89%  General Appearance:    Alert, cooperative, no distress, appears stated age  Head:    Normocephalic, without obvious abnormality, atraumatic  Eyes:    PERRL, conjunctiva/corneas clear, EOM's intact, xanthoma near left eye  Ears:     Normal TM's and external ear canals, both ears  Nose:   Nose with bluish discoloration  Throat:   Lips, mucosa, and tongue normal; teeth and gums normal  Neck:   Supple, symmetrical, trachea midline, no adenopathy;    thyroid:  no enlargement/tenderness/nodules; no carotid   bruit or JVD  Back:     Symmetric, no curvature, ROM normal, no CVA tenderness  Lungs:     Diminished thoughout without WRR  Chest Wall:    No tenderness or deformity   Heart:    Regular rate and rhythm, S1 and S2 normal, no murmur, rub   or gallop  Abdomen:     Soft, non-tender, bowel sounds active all four quadrants,    no masses, no organomegaly  Genitalia:    deferred  Rectal:    deferred  Extremities:   Extremities normal, atraumatic, no cyanosis or edema  Pulses:   2+ and symmetric all extremities  Skin:   Skin color, texture, turgor normal, no rashes or lesions  Lymph nodes:   Cervical, supraclavicular, and axillary nodes normal  Neurologic:   CNII-XII intact, normal strength, sensation and reflexes    throughout           Psych: normal affect  Labs on Admission:  Basic Metabolic Panel:  Recent Labs Lab 02/22/14 0438  NA 145  K 2.8*  CL 92*  CO2 36*  GLUCOSE 138*  BUN 38*  CREATININE 1.89*  CALCIUM 8.7   Liver Function Tests: No results found for this basename: AST, ALT, ALKPHOS, BILITOT, PROT, ALBUMIN,  in the last 168 hours No results found for this basename: LIPASE, AMYLASE,  in the last 168 hours No results found for this basename: AMMONIA,  in the last 168 hours CBC:  Recent Labs Lab 02/22/14 0438  WBC 16.0*  NEUTROABS 14.7*  HGB 10.6*  HCT 33.6*  MCV 98.5  PLT 213   Cardiac Enzymes: No results  found for this basename: CKTOTAL, CKMB, CKMBINDEX, TROPONINI,  in the last 168 hours  BNP (last 3 results)  Recent Labs  10/03/13 1143  PROBNP 3256.0*   CBG: No results found for this basename: GLUCAP,  in the last 168 hours  Radiological Exams on Admission: Ct Abdomen  Pelvis Wo Contrast  02/22/2014   CLINICAL DATA:  Left lower quadrant abdominal pain. Renal insufficiency.  EXAM: CT ABDOMEN AND PELVIS WITHOUT CONTRAST  TECHNIQUE: Multidetector CT imaging of the abdomen and pelvis was performed following the standard protocol without IV contrast.  COMPARISON:  Radiographs obtained earlier today. CT colonoscopy dated 02/13/2012.  FINDINGS: Dilated, fluid-filled colon containing loculated barium and stool. This becomes progressive lead less dilated extending into the sigmoid region width a transition to normal caliber sigmoid colon in the posterior aspect of the lower pelvis on the right. No small bowel dilatation. Normal passage oval contrast through the small bowel.  Bilateral renal cysts. Right renal atrophy. Distal abdominal aortic aneurysm with a maximum AP diameter of 3.9 cm on image number 32. No significant change in low density left adrenal masses. The largest measures 2.2 x 1.2 cm on image number 19 and 2 Hounsfield units in density on image 20. The next largest measures 1.3 x 1.2 cm on image number 16 and 10 Hounsfield units in density.  Unremarkable non contrasted appearance of the liver, spleen, pancreas, right adrenal gland an urinary bladder. Surgically absent uterus. No adnexal masses or enlarged lymph nodes. Distended gallbladder with no visible gallstones, wall thickening or pericholecystic fluid. Changes of COPD and chronic bronchitis at the lung bases with bibasilar linear atelectasis and scarring. Lumbar and lower thoracic spine degenerative changes.  IMPRESSION: 1. Colonic obstruction at the level of the distal sigmoid colon with prominent stool within that portion of the colon. This may be due to a stricture, not previously present. No visible mass. 2. 3.9 cm distal abdominal aortic aneurysm. 3. Left adrenal adenomas. 4. Bilateral renal cysts and right renal atrophy. 5. COPD and chronic bronchitis. 6. Distended gallbladder   Electronically Signed   By: Enrique Sack M.D.   On: 02/22/2014 09:17   Dg Abd 1 View  02/20/2014   CLINICAL DATA:  Decreased urine output. History of uterine cancer. Constipation.  EXAM: ABDOMEN - 1 VIEW  COMPARISON:  11/01/2012  FINDINGS: Right common and left external iliac artery stents. Atherosclerotic vascular calcifications.  Prominent stool throughout the colon favors constipation. No dilated small bowel observed. No compelling findings of renal or ureteral calculi.  IMPRESSION: 1.  Prominent stool throughout the colon favors constipation. 2. Atherosclerosis with iliac stents bilaterally.   Electronically Signed   By: Sherryl Barters M.D.   On: 02/20/2014 15:57   Dg Abd Acute W/chest  02/22/2014   CLINICAL DATA:  Evaluate for small bowel obstruction.  EXAM: ACUTE ABDOMEN SERIES (ABDOMEN 2 VIEW & CHEST 1 VIEW)  COMPARISON:  Abdominal radiograph February 20, 2014 and chest radiograph October 03, 2013  FINDINGS: The cardiac silhouette is mildly enlarged, similar. Similar moderate interstitial prominence, increased lung lung volumes with linear bibasilar densities. No pleural effusions or focal consolidations. No pneumothorax. Soft tissue planes and included osseous structures are nonsuspicious.  Paucity of bowel gas. A few scattered air-fluid levels on the lateral cubitus view, no intraperitoneal free air. RIGHT Common iliac and LEFT apparent external iliac artery stents. Moderate vascular calcifications. Bone mineral density is decreased without destructive bony lesions.  IMPRESSION: Mild cardiomegaly, stable COPD and bibasilar atelectasis versus scarring.  Paucity of bowel gas, nonspecific bowel gas pattern. Fluid filled small bowel obstruction not excluded, if clinically indicated CT of the abdomen and pelvis with contrast would be more sensitive.   Electronically Signed   By: Elon Alas   On: 02/22/2014 05:34   Assessment/Plan Principal Problem:   Colonic obstruction: discussed with Dr. Carlean Purl, on call for Dr. Collene Mares and [place].  Will get gastrograffin enema for diagnostic and therapeutic purposes. Clear liquids for now. Continue miralax Active Problems:   HYPERLIPIDEMIA   HYPERTENSION   Chronic respiratory failure   UTERINE CANCER, HX OF   CAD (coronary artery disease)   Atherosclerosis of abdominal aorta   Tobacco abuse: patch. No interest in quitting   COPD (chronic obstructive pulmonary disease), stable   PVD (peripheral vascular disease)   GERD (gastroesophageal reflux disease)   Chronic anemia   Weakness: PT eval   Hypokalemia: replete and check mag   Chronic diastolic CHF (congestive heart failure)   CKD (chronic kidney disease) stage 3, GFR 30-59 ml/min   Temporal arteritis on chronic prednisone  Code Status: full: Family Communication: husband Disposition Plan:   Time spent: 33 min  [facility] Pager [phone]

## 2014-02-22 NOTE — Consult Note (Signed)
Consultation  Referring Provider: [facility]      Primary Care Physician:  Stephens Shire, MD Primary Gastroenterologist: Dr. Collene Mares        Reason for Consultation:   Colonic obstruction           HPI:   Wanda Yang is a 73 y.o. female with multiple medical problems, on multiple medications who presented to ED last night with acute constipation. She was seen in ED day prior, plain films revealed prominent colonic stool and she was sent home with Golytely but had no relief.Now CTscan reveals colonic obstruction at the level of the distal sigmoid colon with without visible mass. Patient a gives history of chronic constipation. She was started on hydrocodone approximately one week ago. Having nausea and heaves.    Past Medical History  Diagnosis Date  . CAD (coronary artery disease)   . Atherosclerosis of abdominal aorta   . On home oxygen therapy     "3L; 24/7" (10/03/2013)  . Tobacco abuse   . HTN (hypertension)   . COPD (chronic obstructive pulmonary disease)   . PVD (peripheral vascular disease)   . Anxiety   . GERD (gastroesophageal reflux disease)   . RBBB (right bundle branch block)   . Tachycardia   . High cholesterol   . CHF (congestive heart failure)   . UTI (urinary tract infection)   . CKD (chronic kidney disease), stage IV   . Temporal arteritis dx'd 08/2013  . Endometrial carcinoma   . Uterine cancer   . Myocardial infarction ~ 2011    " MILD "  . Anemia   . History of blood transfusion     "S/P anemia for 3 years"  . Daily headache     "last few days" (10/03/2013)  . Arthritis     "hands and fingers" (10/03/2013)  . Chronic lower back pain   . Depression     "sometimes; not often" (10/03/2013)  . Lower GI bleeding 02/2012; 10/03/2013    Past Surgical History  Procedure Laterality Date  . Carotid stent    . Tonsillectomy    . Forearm fracture surgery Left ~ 2005    PLATES AND SCREWS  . Femoral artery stent    . Reduction mammaplasty    .  Wrist fracture surgery Left ~ 2005    "it was crushed; they glued it together"  . Coronary angioplasty with stent placement      "1 + +1"  . Iliac artery stent Bilateral   . Esophagogastroduodenoscopy N/A 10/04/2013    Procedure: ESOPHAGOGASTRODUODENOSCOPY (EGD);  Surgeon: Beryle Beams, MD;  Location: [facility] ENDOSCOPY;  Service: Endoscopy;  Laterality: N/A;  . Vaginal hysterectomy      for cancer    Family History  Problem Relation Age of Onset  . Heart attack Father   . Coronary artery disease Father   . Leukemia Mother   . Leukemia Brother      History  Substance Use Topics  . Smoking status: Current Every Day Smoker -- 0.50 packs/day for 56 years    Types: Cigarettes  . Smokeless tobacco: Never Used  . Alcohol Use: No     Comment: 10/03/2013 "I drink 2-3 times/yr"    Prior to Admission medications   Medication Sig Start Date End Date Taking? Authorizing Provider  albuterol (PROAIR HFA) 108 (90 BASE) MCG/ACT inhaler Inhale 2 puffs into the lungs every 4 (four) hours as needed for wheezing or shortness of breath.  Yes Historical Provider, MD  allopurinol (ZYLOPRIM) 100 MG tablet Take 100 mg by mouth 2 (two) times daily.     Yes Historical Provider, MD  arformoterol (BROVANA) 15 MCG/2ML NEBU Take 15 mcg by nebulization daily.   Yes Historical Provider, MD  aspirin 81 MG tablet Take 1 tablet (81 mg total) by mouth daily. 10/17/13  Yes Charlynne Cousins, MD  budesonide (PULMICORT) 0.5 MG/2ML nebulizer solution Take 0.5 mg by nebulization 2 (two) times daily as needed (shortness of breath).   Yes Historical Provider, MD  calcitRIOL (ROCALTROL) 0.25 MCG capsule Take 0.25 mcg by mouth daily.   Yes Historical Provider, MD  clopidogrel (PLAVIX) 75 MG tablet Take 1 tablet (75 mg total) by mouth daily. 10/17/13  Yes Charlynne Cousins, MD  diltiazem (DILACOR XR) 240 MG 24 hr capsule Take 1 capsule (240 mg total) by mouth daily. 07/15/13  Yes Thayer Headings, MD  famotidine (PEPCID) 20 MG  tablet Take 20 mg by mouth 2 (two) times daily as needed for heartburn.    Yes Historical Provider, MD  ferrous sulfate 325 (65 FE) MG tablet Take 1 tablet (325 mg total) by mouth daily with breakfast. 10/05/13  Yes Charlynne Cousins, MD  furosemide (LASIX) 80 MG tablet Take 80-160 mg by mouth 2 (two) times daily. 160mg  in the morning and 80mg  in the evening   Yes Historical Provider, MD  methocarbamol (ROBAXIN) 500 MG tablet Take 1 tablet (500 mg total) by mouth 2 (two) times daily as needed for muscle spasms. 02/10/14  Yes Viona Gilmore Cartner, PA-C  nitroGLYCERIN (NITROSTAT) 0.4 MG SL tablet Place 0.4 mg under the tongue every 5 (five) minutes as needed for chest pain.   Yes Historical Provider, MD  oxyCODONE-acetaminophen (PERCOCET/ROXICET) 5-325 MG per tablet Take 1 tablet by mouth every 4 (four) hours as needed for moderate pain or severe pain.   Yes Historical Provider, MD  pantoprazole (PROTONIX) 40 MG tablet Take 40 mg by mouth daily.   Yes Historical Provider, MD  polyethylene glycol (MIRALAX / GLYCOLAX) packet Take 34 g by mouth daily as needed for mild constipation.   Yes Historical Provider, MD  predniSONE (DELTASONE) 20 MG tablet Take 20 mg by mouth daily with breakfast.    Yes Historical Provider, MD  Probiotic Product (ALIGN PO) Take 1 tablet by mouth daily.    Yes Historical Provider, MD  rosuvastatin (CRESTOR) 20 MG tablet Take 1 tablet (20 mg total) by mouth daily. 07/15/13  Yes Thayer Headings, MD  tiotropium (SPIRIVA) 18 MCG inhalation capsule Place 18 mcg into inhaler and inhale daily.   Yes Historical Provider, MD  traMADol (ULTRAM) 50 MG tablet Take 50 mg by mouth every 6 (six) hours as needed for moderate pain.    Yes Historical Provider, MD  venlafaxine XR (EFFEXOR-XR) 75 MG 24 hr capsule Take 75 mg by mouth daily.    Yes Historical Provider, MD  vitamin B-12 (CYANOCOBALAMIN) 1000 MCG tablet Take 1,000 mcg by mouth daily.   Yes Historical Provider, MD  polyethylene glycol  (GOLYTELY) 236 G solution Take 4,000 mLs by mouth once. 02/20/14   Merryl Hacker, MD  sodium phosphate (FLEET) 7-19 GM/118ML ENEM Place 133 mLs (1 enema total) rectally daily as needed for severe constipation. 02/20/14   Merryl Hacker, MD    No current facility-administered medications for this encounter.   Current Outpatient Prescriptions  Medication Sig Dispense Refill  . albuterol (PROAIR HFA) 108 (90 BASE) MCG/ACT inhaler Inhale  2 puffs into the lungs every 4 (four) hours as needed for wheezing or shortness of breath.      . allopurinol (ZYLOPRIM) 100 MG tablet Take 100 mg by mouth 2 (two) times daily.        Marland Kitchen arformoterol (BROVANA) 15 MCG/2ML NEBU Take 15 mcg by nebulization daily.      Marland Kitchen aspirin 81 MG tablet Take 1 tablet (81 mg total) by mouth daily.  30 tablet    . budesonide (PULMICORT) 0.5 MG/2ML nebulizer solution Take 0.5 mg by nebulization 2 (two) times daily as needed (shortness of breath).      . calcitRIOL (ROCALTROL) 0.25 MCG capsule Take 0.25 mcg by mouth daily.      . clopidogrel (PLAVIX) 75 MG tablet Take 1 tablet (75 mg total) by mouth daily.  90 tablet  3  . diltiazem (DILACOR XR) 240 MG 24 hr capsule Take 1 capsule (240 mg total) by mouth daily.  90 capsule  3  . famotidine (PEPCID) 20 MG tablet Take 20 mg by mouth 2 (two) times daily as needed for heartburn.       . ferrous sulfate 325 (65 FE) MG tablet Take 1 tablet (325 mg total) by mouth daily with breakfast.  90 tablet  3  . furosemide (LASIX) 80 MG tablet Take 80-160 mg by mouth 2 (two) times daily. 160mg  in the morning and 80mg  in the evening      . methocarbamol (ROBAXIN) 500 MG tablet Take 1 tablet (500 mg total) by mouth 2 (two) times daily as needed for muscle spasms.  20 tablet  0  . nitroGLYCERIN (NITROSTAT) 0.4 MG SL tablet Place 0.4 mg under the tongue every 5 (five) minutes as needed for chest pain.      Marland Kitchen oxyCODONE-acetaminophen (PERCOCET/ROXICET) 5-325 MG per tablet Take 1 tablet by mouth every 4  (four) hours as needed for moderate pain or severe pain.      . pantoprazole (PROTONIX) 40 MG tablet Take 40 mg by mouth daily.      . polyethylene glycol (MIRALAX / GLYCOLAX) packet Take 34 g by mouth daily as needed for mild constipation.      . predniSONE (DELTASONE) 20 MG tablet Take 20 mg by mouth daily with breakfast.       . Probiotic Product (ALIGN PO) Take 1 tablet by mouth daily.       . rosuvastatin (CRESTOR) 20 MG tablet Take 1 tablet (20 mg total) by mouth daily.  90 tablet  3  . tiotropium (SPIRIVA) 18 MCG inhalation capsule Place 18 mcg into inhaler and inhale daily.      . traMADol (ULTRAM) 50 MG tablet Take 50 mg by mouth every 6 (six) hours as needed for moderate pain.       Marland Kitchen venlafaxine XR (EFFEXOR-XR) 75 MG 24 hr capsule Take 75 mg by mouth daily.       . vitamin B-12 (CYANOCOBALAMIN) 1000 MCG tablet Take 1,000 mcg by mouth daily.      . polyethylene glycol (GOLYTELY) 236 G solution Take 4,000 mLs by mouth once.  4000 mL  0  . sodium phosphate (FLEET) 7-19 GM/118ML ENEM Place 133 mLs (1 enema total) rectally daily as needed for severe constipation.  230 mL  0    Allergies as of 02/21/2014 - Review Complete 02/21/2014  Allergen Reaction Noted  . Uloric [febuxostat] Other (See Comments) 09/03/2010  . Zolpidem tartrate Other (See Comments) 08/10/2010  . Ciprofloxacin Anxiety and Other (See Comments)  11/01/2012  . Contrast media [iodinated diagnostic agents] Rash 09/13/2013  . Tetracycline Rash 07/24/2008  . Vicodin [hydrocodone-acetaminophen] Anxiety 08/10/2010   Review of Systems:    All systems reviewed and negative except where noted in HPI.   Physical Exam:  Vital signs in last 24 hours: Temp:  [98.3 F (36.8 C)] 98.3 F (36.8 C) (09/18 2158) Pulse Rate:  [90-103] 94 (09/19 1030) Resp:  [14-22] 22 (09/19 0900) BP: (118-152)/(72-102) 133/72 mmHg (09/19 1030) SpO2:  [89 %-94 %] 89 % (09/19 1030)   General:   Pleasant white female in NAD Head:  Normocephalic  and atraumatic. Eyes:   No icterus.   Conjunctiva pink. Ears:  Normal auditory acuity. Neck:  Supple; no masses felt Lungs:  Respirations even and unlabored. Decreased breath sounds throughout upper and lower lobes.  Heart:  Regular rate and rhythm. Abdomen:  Soft, protuberant but nontender, +tympany upper abdomen. Normal bowel sounds. No appreciable masses or hepatomegaly.  Msk:  Symmetrical without gross deformities.  Extremities:  Without edema. Neurologic:  Alert and  oriented x4;  grossly normal neurologically. Skin:  Intact without significant lesions or rashes. Cervical Nodes:  No significant cervical adenopathy. Psych:  Alert and cooperative. Normal affect.  LAB RESULTS:  Recent Labs  02/22/14 0438  WBC 16.0*  HGB 10.6*  HCT 33.6*  PLT 213   BMET  Recent Labs  02/22/14 0438  NA 145  K 2.8*  CL 92*  CO2 36*  GLUCOSE 138*  BUN 38*  CREATININE 1.89*  CALCIUM 8.7   STUDIES: Ct Abdomen Pelvis Wo Contrast  02/22/2014   CLINICAL DATA:  Left lower quadrant abdominal pain. Renal insufficiency.  EXAM: CT ABDOMEN AND PELVIS WITHOUT CONTRAST  TECHNIQUE: Multidetector CT imaging of the abdomen and pelvis was performed following the standard protocol without IV contrast.  COMPARISON:  Radiographs obtained earlier today. CT colonoscopy dated 02/13/2012.  FINDINGS: Dilated, fluid-filled colon containing loculated barium and stool. This becomes progressive lead less dilated extending into the sigmoid region width a transition to normal caliber sigmoid colon in the posterior aspect of the lower pelvis on the right. No small bowel dilatation. Normal passage oval contrast through the small bowel.  Bilateral renal cysts. Right renal atrophy. Distal abdominal aortic aneurysm with a maximum AP diameter of 3.9 cm on image number 32. No significant change in low density left adrenal masses. The largest measures 2.2 x 1.2 cm on image number 19 and 2 Hounsfield units in density on image 20. The  next largest measures 1.3 x 1.2 cm on image number 16 and 10 Hounsfield units in density.  Unremarkable non contrasted appearance of the liver, spleen, pancreas, right adrenal gland an urinary bladder. Surgically absent uterus. No adnexal masses or enlarged lymph nodes. Distended gallbladder with no visible gallstones, wall thickening or pericholecystic fluid. Changes of COPD and chronic bronchitis at the lung bases with bibasilar linear atelectasis and scarring. Lumbar and lower thoracic spine degenerative changes.  IMPRESSION: 1. Colonic obstruction at the level of the distal sigmoid colon with prominent stool within that portion of the colon. This may be due to a stricture, not previously present. No visible mass. 2. 3.9 cm distal abdominal aortic aneurysm. 3. Left adrenal adenomas. 4. Bilateral renal cysts and right renal atrophy. 5. COPD and chronic bronchitis. 6. Distended gallbladder   Electronically Signed   By: Enrique Sack M.D.   On: 02/22/2014 09:17   Dg Abd 1 View  02/20/2014   CLINICAL DATA:  Decreased urine output.  History of uterine cancer. Constipation.  EXAM: ABDOMEN - 1 VIEW  COMPARISON:  11/01/2012  FINDINGS: Right common and left external iliac artery stents. Atherosclerotic vascular calcifications.  Prominent stool throughout the colon favors constipation. No dilated small bowel observed. No compelling findings of renal or ureteral calculi.  IMPRESSION: 1.  Prominent stool throughout the colon favors constipation. 2. Atherosclerosis with iliac stents bilaterally.   Electronically Signed   By: Sherryl Barters M.D.   On: 02/20/2014 15:57   Dg Abd Acute W/chest  02/22/2014   CLINICAL DATA:  Evaluate for small bowel obstruction.  EXAM: ACUTE ABDOMEN SERIES (ABDOMEN 2 VIEW & CHEST 1 VIEW)  COMPARISON:  Abdominal radiograph February 20, 2014 and chest radiograph October 03, 2013  FINDINGS: The cardiac silhouette is mildly enlarged, similar. Similar moderate interstitial prominence, increased lung  lung volumes with linear bibasilar densities. No pleural effusions or focal consolidations. No pneumothorax. Soft tissue planes and included osseous structures are nonsuspicious.  Paucity of bowel gas. A few scattered air-fluid levels on the lateral cubitus view, no intraperitoneal free air. RIGHT Common iliac and LEFT apparent external iliac artery stents. Moderate vascular calcifications. Bone mineral density is decreased without destructive bony lesions.  IMPRESSION: Mild cardiomegaly, stable COPD and bibasilar atelectasis versus scarring.  Paucity of bowel gas, nonspecific bowel gas pattern. Fluid filled small bowel obstruction not excluded, if clinically indicated CT of the abdomen and pelvis with contrast would be more sensitive.   Electronically Signed   By: Elon Alas   On: 02/22/2014 05:34     PREVIOUS ENDOSCOPIES:            EGD (Dr. Benson Norway) May 2015 IMPRESSION: 1) Duodenal diverticulum. RECOMMENDATIONS: 1) Follow HGB and transfuse as necessary. 2) I will discuss the case with Dr. Collene Mares to see if she wants to pursue a colonoscopy. She had a colonoscopy in 2008 with Dr. Collene Mares with findings of hyperplastic polyps. A virtual colonoscopy was performed 2 years ago with no clearly defined pathology, although there is the possibility of a distal polyp or retained untagged stool. I presume the virtual colonoscopy was performed as a result of her worsening pulmonary status.   Impression / Plan:   60. 73 year old female with acute constipation / CTscan revealing dilated colon with transition point in sigmoid. She started hydrocodone a week ago. She passed a couple of small hard stools after GG enema today. Doubt colon mass or stricture, this is probably just severe constipation, especially since there were no gross lesions seen on virtual colonoscopy in 2013. Will continue to stimulate her from below. With nausea she may not be able to drink miralax.Trial of SMOG enema today. Try and avoid  narcotics until this can be resolved.  2. 3.9 cm distal abdominal aortic aneurysm  3. Mild normocytic anemia, probably dilutional  4. Hx of endometrial cancer, s/p vaginal hysterectomy and bilateral salpingo-oophorectomy 02/23/2010. Final pathology showed a grade 2 endometrial carcinoma confined to an endometrial polyp  5. Temporal arteritis, on prednisone  6. Oxygen dependent COPD - STILL SMOKES  7. CKD   Thanks   LOS: 0 days   Tye Savoy  02/22/2014, 10:55 AM    [employer] [facility] Attending  I have also seen and assessed the patient and agree with the above note.  She did have 2 soft tissue density lesions in left colon - sigmoid on the CT colonoscopy 2013 so will keep that in mind.  Suspect narcotics aggravated chronic issues.\  Gatha Mayer, MD, Mar[facility] Regal  [facility] [phone] (pager) 02/22/2014 4:06 PM

## 2014-02-23 LAB — CBC
HCT: 34.8 % — ABNORMAL LOW (ref 36.0–46.0)
Hemoglobin: 11.1 g/dL — ABNORMAL LOW (ref 12.0–15.0)
MCH: 31.8 pg (ref 26.0–34.0)
MCHC: 31.9 g/dL (ref 30.0–36.0)
MCV: 99.7 fL (ref 78.0–100.0)
Platelets: 255 10*3/uL (ref 150–400)
RBC: 3.49 MIL/uL — ABNORMAL LOW (ref 3.87–5.11)
RDW: 17.8 % — ABNORMAL HIGH (ref 11.5–15.5)
WBC: 17.7 10*3/uL — AB (ref 4.0–10.5)

## 2014-02-23 LAB — BASIC METABOLIC PANEL
Anion gap: 17 — ABNORMAL HIGH (ref 5–15)
BUN: 27 mg/dL — ABNORMAL HIGH (ref 6–23)
CO2: 32 meq/L (ref 19–32)
CREATININE: 1.63 mg/dL — AB (ref 0.50–1.10)
Calcium: 9.2 mg/dL (ref 8.4–10.5)
Chloride: 95 mEq/L — ABNORMAL LOW (ref 96–112)
GFR calc Af Amer: 35 mL/min — ABNORMAL LOW (ref >90)
GFR calc non Af Amer: 30 mL/min — ABNORMAL LOW (ref >90)
Glucose, Bld: 88 mg/dL (ref 70–99)
Potassium: 4 mEq/L (ref 3.7–5.3)
SODIUM: 144 meq/L (ref 137–147)

## 2014-02-23 MED ORDER — SENNA 8.6 MG PO TABS
2.0000 | ORAL_TABLET | Freq: Every day | ORAL | Status: DC
  Administered 2014-02-23: 17.2 mg via ORAL
  Filled 2014-02-23 (×2): qty 2

## 2014-02-23 MED ORDER — POLYETHYLENE GLYCOL 3350 17 G PO PACK
34.0000 g | PACK | Freq: Two times a day (BID) | ORAL | Status: DC
  Filled 2014-02-23 (×3): qty 2

## 2014-02-23 MED ORDER — HYDRALAZINE HCL 20 MG/ML IJ SOLN
10.0000 mg | Freq: Four times a day (QID) | INTRAMUSCULAR | Status: DC | PRN
  Administered 2014-02-23: 10 mg via INTRAVENOUS
  Filled 2014-02-23: qty 1

## 2014-02-23 NOTE — Progress Notes (Deleted)
Patient upset over pain mediation and restriction on diet.  I explained to patient that I would notify the MD.  He said "The hell with this, I leaving AMA.Get me the paper to sign." Patient signed AMA paperwork and MD notified. Patient requesting ambulance to take him home.

## 2014-02-23 NOTE — Progress Notes (Addendum)
[facility] PROGRESS NOTE  Wanda Yang:[id number] DOB: 1941-03-29 DOA: 02/22/2014 PCP: Stephens Shire, MD  Summary 32 female with MMP recently started on pain medication for leg and back pain presented to ED several times for severe constipation for over a week. CT shows concern for colonic obstruction.  Assessment/Plan:  Principal Problem:   Colonic obstruction/vs severe constipation. gastrograffin enema incomplete, but no definite mass or obstruction seen. Starting to have stools. Keep on clears in case GI recommends colonoscopy or flex sig. Appreciate GI assistance Active Problems:   HYPERLIPIDEMIA   HYPERTENSION   Chronic respiratory failure   UTERINE CANCER, HX OF   CAD (coronary artery disease)   Atherosclerosis of abdominal aorta   Tobacco abuse   COPD (chronic obstructive pulmonary disease)   PVD (peripheral vascular disease)   GERD (gastroesophageal reflux disease)   Chronic anemia   Weakness   Hypokalemia corrected. D/c tele   Chronic diastolic CHF (congestive heart failure)   CKD (chronic kidney disease) stage 3, GFR 30-59 ml/min   Temporal arteritis Leukocytosis: no fever or signs of infection  Code Status:  full Family Communication:  At bedside Disposition Plan:  home  Consultants:  GI  Procedures:     Antibiotics:    HPI/Subjective: Had medium soft stool today. No n/v. Still feels constipated and lower abd pain.  tol clears  Objective: Filed Vitals:   02/23/14 0827  BP: 145/76  Pulse: 103  Temp: 98.5 F (36.9 C)  Resp:     Intake/Output Summary (Last 24 hours) at 02/23/14 1343 Last data filed at 02/22/14 2123  Gross per 24 hour  Intake      3 ml  Output      0 ml  Net      3 ml   Filed Weights   02/22/14 1100 02/22/14 2001  Weight: 73.619 kg (162 lb 4.8 oz) 75.524 kg (166 lb 8 oz)    Exam:   General:  In chair. comfortable  Cardiovascular: RRR  Respiratory: diminished thoughout without WRR  Abdomen: bs  present. Distended. Nontender. No rebound.  Ext: no CCE  Basic Metabolic Panel:  Recent Labs Lab 02/22/14 0438 02/22/14 1350 02/23/14 0609  NA 145  --  144  K 2.8*  --  4.0  CL 92*  --  95*  CO2 36*  --  32  GLUCOSE 138*  --  88  BUN 38*  --  27*  CREATININE 1.89*  --  1.63*  CALCIUM 8.7  --  9.2  MG  --  1.9  --    Liver Function Tests: No results found for this basename: AST, ALT, ALKPHOS, BILITOT, PROT, ALBUMIN,  in the last 168 hours No results found for this basename: LIPASE, AMYLASE,  in the last 168 hours No results found for this basename: AMMONIA,  in the last 168 hours CBC:  Recent Labs Lab 02/22/14 0438 02/23/14 0609  WBC 16.0* 17.7*  NEUTROABS 14.7*  --   HGB 10.6* 11.1*  HCT 33.6* 34.8*  MCV 98.5 99.7  PLT 213 255   Cardiac Enzymes: No results found for this basename: CKTOTAL, CKMB, CKMBINDEX, TROPONINI,  in the last 168 hours BNP (last 3 results)  Recent Labs  10/03/13 1143  PROBNP 3256.0*   CBG: No results found for this basename: GLUCAP,  in the last 168 hours  Recent Results (from the past 240 hour(s))  URINE CULTURE     Status: None   Collection Time    02/22/14  6:03 AM      Result Value Ref Range Status   Specimen Description URINE, CLEAN CATCH   Final   Special Requests Normal   Final   Culture  Setup Time     Final   Value: 02/22/2014 18:27     Performed at [facility]     Final   Value: NO GROWTH     Performed at [facility]   Culture     Final   Value: NO GROWTH     Performed at [facility]   Report Status 02/22/2014 FINAL   Final     Studies: Ct Abdomen Pelvis Wo Contrast  02/22/2014   CLINICAL DATA:  Left lower quadrant abdominal pain. Renal insufficiency.  EXAM: CT ABDOMEN AND PELVIS WITHOUT CONTRAST  TECHNIQUE: Multidetector CT imaging of the abdomen and pelvis was performed following the standard protocol without IV contrast.  COMPARISON:  Radiographs obtained earlier today.  CT colonoscopy dated 02/13/2012.  FINDINGS: Dilated, fluid-filled colon containing loculated barium and stool. This becomes progressive lead less dilated extending into the sigmoid region width a transition to normal caliber sigmoid colon in the posterior aspect of the lower pelvis on the right. No small bowel dilatation. Normal passage oval contrast through the small bowel.  Bilateral renal cysts. Right renal atrophy. Distal abdominal aortic aneurysm with a maximum AP diameter of 3.9 cm on image number 32. No significant change in low density left adrenal masses. The largest measures 2.2 x 1.2 cm on image number 19 and 2 Hounsfield units in density on image 20. The next largest measures 1.3 x 1.2 cm on image number 16 and 10 Hounsfield units in density.  Unremarkable non contrasted appearance of the liver, spleen, pancreas, right adrenal gland an urinary bladder. Surgically absent uterus. No adnexal masses or enlarged lymph nodes. Distended gallbladder with no visible gallstones, wall thickening or pericholecystic fluid. Changes of COPD and chronic bronchitis at the lung bases with bibasilar linear atelectasis and scarring. Lumbar and lower thoracic spine degenerative changes.  IMPRESSION: 1. Colonic obstruction at the level of the distal sigmoid colon with prominent stool within that portion of the colon. This may be due to a stricture, not previously present. No visible mass. 2. 3.9 cm distal abdominal aortic aneurysm. 3. Left adrenal adenomas. 4. Bilateral renal cysts and right renal atrophy. 5. COPD and chronic bronchitis. 6. Distended gallbladder   Electronically Signed   By: Enrique Sack M.D.   On: 02/22/2014 09:17   Dg Abd Acute W/chest  02/22/2014   CLINICAL DATA:  Evaluate for small bowel obstruction.  EXAM: ACUTE ABDOMEN SERIES (ABDOMEN 2 VIEW & CHEST 1 VIEW)  COMPARISON:  Abdominal radiograph February 20, 2014 and chest radiograph October 03, 2013  FINDINGS: The cardiac silhouette is mildly enlarged,  similar. Similar moderate interstitial prominence, increased lung lung volumes with linear bibasilar densities. No pleural effusions or focal consolidations. No pneumothorax. Soft tissue planes and included osseous structures are nonsuspicious.  Paucity of bowel gas. A few scattered air-fluid levels on the lateral cubitus view, no intraperitoneal free air. RIGHT Common iliac and LEFT apparent external iliac artery stents. Moderate vascular calcifications. Bone mineral density is decreased without destructive bony lesions.  IMPRESSION: Mild cardiomegaly, stable COPD and bibasilar atelectasis versus scarring.  Paucity of bowel gas, nonspecific bowel gas pattern. Fluid filled small bowel obstruction not excluded, if clinically indicated CT of the abdomen and pelvis with contrast would be more sensitive.  Electronically Signed   By: Elon Alas   On: 02/22/2014 05:34   Dg Colon W/water Sol Cm  02/22/2014   CLINICAL DATA:  Constipation with last bowel movement 7 days ago  EXAM: COLON WITH WATER soluble contrast  COMPARISON:  CT scan of the abdomen and pelvis of today's date and supine film of the abdomen also of today's date.  FINDINGS: The scout film reveals CT contrast within small and large bowel in the mid and lower abdomen. There are known loops of mildly distended fluid filled ascending, transverse, and descending colon and considerable stool in the rectosigmoid on the CT scan.  The rectum was catheterized and the balloon inflated under fluoroscopic guidance. Filling of the rectosigmoid immediately revealed floating stool balls within the normal calibered distal and mid sigmoid. More proximally a stool ball appeared impacted in the proximal sigmoid. Some contrast did flow around this. However, at that time the patient expelled the balloon. The sigmoid then decompressed and a stool ball was passed. The rectum was again catheterized and the balloon inflated to a higher volume. Refilling of the bowel was  performed. Similar findings of a normal caliber sigmoid with multiple stool balls were again demonstrated. The patient then expelled the catheter once again. The study was then terminated.  IMPRESSION: Only the sigmoid could be visualized on this study. The anal sphincter was mildly patulous. Several large stool balls were noted to be in the sigmoid both fluoroscopically and on the earlier CT scan. The visualized portions of the lumen appeared normal. Some passage of stool occurred. It is hoped that the osmotic affects of the Gastrografin well help stimulate passage of additional stool.  The patient tolerated the procedure reasonably well.   Electronically Signed   By: David  Martinique   On: 02/22/2014 12:42    Scheduled Meds: . allopurinol  100 mg Oral BID  . arformoterol  15 mcg Nebulization Daily  . aspirin  81 mg Oral Daily  . atorvastatin  40 mg Oral q1800  . calcitRIOL  0.25 mcg Oral Daily  . diltiazem  240 mg Oral Daily  . heparin  5,000 Units Subcutaneous 3 times per day  . nicotine  14 mg Transdermal Daily  . pantoprazole  40 mg Oral Daily  . polyethylene glycol  34 g Oral Daily  . predniSONE  20 mg Oral Q breakfast  . sodium chloride  3 mL Intravenous Q12H  . tiotropium  18 mcg Inhalation Daily  . venlafaxine XR  75 mg Oral Daily  . vitamin B-12  1,000 mcg Oral Daily   Continuous Infusions:   Time spent: 25 minutes  [facility] Pager [phone]. If 7PM-7AM, please contact night-coverage at [email or web address], password [facility] 02/23/2014, 1:43 PM  LOS: 1 day

## 2014-02-23 NOTE — Evaluation (Signed)
Physical Therapy Evaluation Patient Details Name: Wanda Yang MRN: [id number] DOB: [birth year] Today's Date: 02/23/2014   History of Present Illness  73 yo female With history of peripheral vascular disease, coronary artery disease, chronic kidney disease, tobacco abuse with COPD and chronic hypoxic respiratory failure, temporal arteritis who presents with constipation for 8 days. She was recently given a prescription for oxycodone for back pain. She has a history of chronic constipation. She was evaluated in the emergency room 2 days ago, x-ray showed prominent stool burden. She was given a prescription for GoLYTELY. She had no results. She returned to the emergency room. She received milk of magnesia enema, mineral oil enema, with minimal results. Eventually, CAT scan was done which showed possible colonic obstruction but no definite mass.  Her appetite has been poor and she has not been eating well. She feels weak. She has had EGD by Dr. Benson Norway and has seen Dr. Mickle Mallory in the office previously.  CT virtual colonoscopy from 2013 shows 2 foci of soft tissue density within the distal colon, could represent non-tach to stool or colonic polyps.  Clinical Impression   Pt admitted with above. Pt currently with functional limitations due to the deficits listed below (see PT Problem List).  Pt will benefit from skilled PT to increase their independence and safety with mobility to allow discharge to the venue listed below.       Follow Up Recommendations Home health PT;Supervision/Assistance - 24 hour    Equipment Recommendations  Rolling walker with 5" wheels;3in1 (PT) (may already have)    Recommendations for Other Services       Precautions / Restrictions Precautions Precautions: Fall Precaution Comments: Pt reports her BP tends to go low when she stands up      Mobility  Bed Mobility Overal bed mobility: Needs Assistance Bed Mobility: Supine to Sit     Supine to sit: Min assist      General bed mobility comments: Min assist to acheive fully upright sitting  Transfers Overall transfer level: Needs assistance Equipment used: Rolling walker (2 wheeled) Transfers: Sit to/from Stand Sit to Stand: Min guard         General transfer comment: Cues for hand placement and technique  Ambulation/Gait Ambulation/Gait assistance: Min guard Ambulation Distance (Feet):  (pivot steps bed to chair) Assistive device: Rolling walker (2 wheeled)       General Gait Details: Overall good, steady steps to recliner with UE support on RW  Stairs            Wheelchair Mobility    Modified Rankin (Stroke Patients Only)       Balance Overall balance assessment: History of Falls                                           Pertinent Vitals/Pain Pain Assessment: 0-10 Pain Score: 3  Pain Location: back Pain Descriptors / Indicators: Aching Pain Intervention(s): Monitored during session;Repositioned  Session conducted on supplemental O2    Home Living Family/patient expects to be discharged to:: Private residence Living Arrangements: Spouse/significant other Available Help at Discharge: Family;Available 24 hours/day Type of Home: House Home Access: Stairs to enter Entrance Stairs-Rails: Right Entrance Stairs-Number of Steps: 5 in garage Home Layout: Two level;Able to live on main level with bedroom/bathroom Home Equipment: Gilford Rile - 2 wheels;Bedside commode;Shower seat;Cane - single point      Prior  Function Level of Independence: Independent         Comments: cant stand up long due to back issues     Hand Dominance   Dominant Hand: Right    Extremity/Trunk Assessment   Upper Extremity Assessment: Defer to OT evaluation           Lower Extremity Assessment: Generalized weakness         Communication   Communication: No difficulties  Cognition Arousal/Alertness: Awake/alert Behavior During Therapy: WFL for tasks  assessed/performed Overall Cognitive Status: Within Functional Limits for tasks assessed                      General Comments      Exercises        Assessment/Plan    PT Assessment Patient needs continued PT services  PT Diagnosis Difficulty walking   PT Problem List Decreased strength;Decreased activity tolerance;Decreased balance;Decreased mobility;Decreased knowledge of use of DME  PT Treatment Interventions DME instruction;Gait training;Stair training;Functional mobility training;Therapeutic activities;Therapeutic exercise;Patient/family education   PT Goals (Current goals can be found in the Care Plan section) Acute Rehab PT Goals Patient Stated Goal: hopes to go home soon; so very glad to have been able to move her bowels PT Goal Formulation: With patient Time For Goal Achievement: 03/02/14 Potential to Achieve Goals: Good    Frequency Min 3X/week   Barriers to discharge        Co-evaluation               End of Session   Activity Tolerance: Patient tolerated treatment well Patient left: in chair;with call bell/phone within reach;with family/visitor present Nurse Communication: Mobility status         Time: 1205-1225 PT Time Calculation (min): 20 min   Charges:   PT Evaluation $Initial PT Evaluation Tier I: 1 Procedure PT Treatments $Therapeutic Activity: 8-22 mins   PT G Codes:          Quin Hoop 02/23/2014, 3:05 PM  [facility], [place] Pager [phone] Office [phone]

## 2014-02-24 DIAGNOSIS — D72829 Elevated white blood cell count, unspecified: Secondary | ICD-10-CM

## 2014-02-24 DIAGNOSIS — J9610 Chronic respiratory failure, unspecified whether with hypoxia or hypercapnia: Secondary | ICD-10-CM

## 2014-02-24 DIAGNOSIS — R0902 Hypoxemia: Secondary | ICD-10-CM

## 2014-02-24 MED ORDER — DOCUSATE SODIUM 100 MG PO CAPS
100.0000 mg | ORAL_CAPSULE | Freq: Two times a day (BID) | ORAL | Status: DC
  Administered 2014-02-24 – 2014-02-25 (×2): 100 mg via ORAL
  Filled 2014-02-24 (×3): qty 1

## 2014-02-24 MED ORDER — BOOST PLUS PO LIQD
237.0000 mL | Freq: Two times a day (BID) | ORAL | Status: DC
  Administered 2014-02-24: 15:00:00 via ORAL
  Administered 2014-02-25 (×2): 237 mL via ORAL
  Filled 2014-02-24 (×5): qty 237

## 2014-02-24 MED ORDER — POLYETHYLENE GLYCOL 3350 17 G PO PACK
17.0000 g | PACK | Freq: Every day | ORAL | Status: DC | PRN
  Filled 2014-02-24: qty 1

## 2014-02-24 MED ORDER — SENNA 8.6 MG PO TABS
2.0000 | ORAL_TABLET | Freq: Every day | ORAL | Status: DC | PRN
  Filled 2014-02-24: qty 2

## 2014-02-24 MED ORDER — POLYETHYLENE GLYCOL 3350 17 G PO PACK
17.0000 g | PACK | Freq: Every day | ORAL | Status: DC
  Administered 2014-02-25: 17 g via ORAL
  Filled 2014-02-24 (×2): qty 1

## 2014-02-24 NOTE — Progress Notes (Signed)
Physical Therapy Treatment Patient Details Name: Wanda Yang MRN: [id number] DOB: 1940/11/08 Today's Date: 02/24/2014    History of Present Illness 73 yo female With history of peripheral vascular disease, coronary artery disease, chronic kidney disease, tobacco abuse with COPD and chronic hypoxic respiratory failure, temporal arteritis who presents with constipation for 8 days. She was recently given a prescription for oxycodone for back pain. She has a history of chronic constipation. She was evaluated in the emergency room 2 days ago, x-ray showed prominent stool burden. She was given a prescription for GoLYTELY. She had no results. She returned to the emergency room. She received milk of magnesia enema, mineral oil enema, with minimal results. Eventually, CAT scan was done which showed possible colonic obstruction but no definite mass.  Her appetite has been poor and she has not been eating well. She feels weak. She has had EGD by Dr. Benson Norway and has seen Dr. Mickle Mallory in the office previously.  CT virtual colonoscopy from 2013 shows 2 foci of soft tissue density within the distal colon, could represent non-tach to stool or colonic polyps.    PT Comments    Showing great improvements with mobility, activity tolerance; Desatted with amb on 3 L O2 to 85%; Recovered back to low 90s% with seated rest  See vitals flow sheet for Orthostatic Vitals -- pt's BP did drop with sitting and standing, pt is aware and demonstrates good self-monitoring for activity tolerance   Follow Up Recommendations  Home health PT;Supervision/Assistance - 24 hour     Equipment Recommendations  Rolling walker with 5" wheels;3in1 (PT)    Recommendations for Other Services       Precautions / Restrictions Precautions Precautions: Fall Precaution Comments: Pt reports her BP tends to go low when she stands up (does a pretty good job of self-monitoring for activity tol) Restrictions Weight Bearing Restrictions: No     Mobility  Bed Mobility   Bed Mobility: Supine to Sit;Sit to Supine     Supine to sit: Supervision;HOB elevated Sit to supine: Supervision;HOB elevated   General bed mobility comments: no physical assist required, able to scoot herself up in bed independently with bed rails  Transfers Overall transfer level: Needs assistance Equipment used: Rolling walker (2 wheeled) Transfers: Sit to/from Stand Sit to Stand: Supervision         General transfer comment: Cues for hand placement and technique  Ambulation/Gait Ambulation/Gait assistance: Min guard Ambulation Distance (Feet): 85 Feet Assistive device: Rolling walker (2 wheeled) Gait Pattern/deviations: Step-through pattern;Decreased stride length Gait velocity: decr   General Gait Details: Cues to self-monitro for activity tolerance   Stairs            Wheelchair Mobility    Modified Rankin (Stroke Patients Only)       Balance                                    Cognition Arousal/Alertness: Awake/alert Behavior During Therapy: WFL for tasks assessed/performed Overall Cognitive Status: Within Functional Limits for tasks assessed                      Exercises      General Comments        Pertinent Vitals/Pain Pain Assessment: 0-10 Pain Score: 6  Pain Location: Bil anterior thighs Pain Descriptors / Indicators: Cramping Pain Intervention(s): Monitored during session    Home Living Family/patient expects  to be discharged to:: Private residence Living Arrangements: Spouse/significant other Available Help at Discharge: Family;Available 24 hours/day Type of Home: House Home Access: Stairs to enter Entrance Stairs-Rails: Right Home Layout: Two level;Able to live on main level with bedroom/bathroom Home Equipment: Gilford Rile - 2 wheels;Bedside commode;Shower seat;Cane - single point;Hand held shower head;Wheelchair - manual (Oxygen) Additional Comments: pt is 02 dependent at  baseline    Prior Function Level of Independence: Needs assistance    ADL's / Homemaking Assistance Needed: husband assists by rinsing pt when showering, supervises shower transfer, performs cooking and housekeeping Comments: cant stand up long due to back issues   PT Goals (current goals can now be found in the care plan section) Acute Rehab PT Goals Patient Stated Goal: hopes to go home soon; so very glad to have been able to move her bowels PT Goal Formulation: With patient Time For Goal Achievement: 03/02/14 Potential to Achieve Goals: Good Progress towards PT goals: Progressing toward goals    Frequency  Min 3X/week    PT Plan Current plan remains appropriate    Co-evaluation             End of Session Equipment Utilized During Treatment: Oxygen Activity Tolerance: Patient tolerated treatment well Patient left: in bed;with call bell/phone within reach;with family/visitor present     Time: 1220-1250 PT Time Calculation (min): 30 min  Charges:  $Gait Training: 8-22 mins $Therapeutic Activity: 8-22 mins                    G Codes:      Quin Hoop 02/24/2014, 1:33 PM  [facility], [place] Pager [phone] Office [phone]

## 2014-02-24 NOTE — Evaluation (Signed)
Occupational Therapy Evaluation Patient Details Name: DEMETRICA ZIPP MRN: [id number] DOB: 1941/02/20 Today's Date: 02/24/2014    History of Present Illness 73 yo female With history of peripheral vascular disease, coronary artery disease, chronic kidney disease, tobacco abuse with COPD and chronic hypoxic respiratory failure, temporal arteritis who presents with constipation for 8 days. She was recently given a prescription for oxycodone for back pain. She has a history of chronic constipation. She was evaluated in the emergency room 2 days ago, x-ray showed prominent stool burden. She was given a prescription for GoLYTELY. She had no results. She returned to the emergency room. She received milk of magnesia enema, mineral oil enema, with minimal results. Eventually, CAT scan was done which showed possible colonic obstruction but no definite mass.  Her appetite has been poor and she has not been eating well. She feels weak. She has had EGD by Dr. Benson Norway and has seen Dr. Mickle Mallory in the office previously.  CT virtual colonoscopy from 2013 shows 2 foci of soft tissue density within the distal colon, could represent non-tach to stool or colonic polyps.   Clinical Impression   Pt was assisted for showering and IADL prior to admission due to long standing back pain and decreased activity tolerance.  Pt presents with generalized weakness and decreased balance with c/o R LE pain interfering with ability to perform ADL.  Provided energy conservation handout and reviewed some strategies with her.  Will follow acutely.  Do not anticipate pt will need OT follow up at home.     Follow Up Recommendations  No OT follow up    Equipment Recommendations  None recommended by OT    Recommendations for Other Services       Precautions / Restrictions Precautions Precautions: Fall Precaution Comments: Pt reports her BP tends to go low when she stands up Restrictions Weight Bearing Restrictions: No       Mobility Bed Mobility   Bed Mobility: Supine to Sit;Sit to Supine     Supine to sit: Supervision;HOB elevated (use of rail) Sit to supine: Supervision;HOB elevated   General bed mobility comments: no physical assist required, able to scoot herself up in bed independently with bed rails  Transfers Overall transfer level: Needs assistance Equipment used: Rolling walker (2 wheeled) Transfers: Sit to/from Stand Sit to Stand: Supervision         General transfer comment: Cues for hand placement and technique    Balance                                            ADL Overall ADL's : Needs assistance/impaired Eating/Feeding: Independent;Sitting   Grooming: Wash/dry hands;Supervision/safety;Standing;Oral care;Sitting Grooming Details (indicate cue type and reason): stood for one activity and then needed to sit to complete the rest Upper Body Bathing: Supervision/ safety;Sitting   Lower Body Bathing: Supervison/ safety;Sit to/from stand Lower Body Bathing Details (indicate cue type and reason): instructed pt in use of long sponge and to bring foot across opposite knee Upper Body Dressing : Set up;Sitting   Lower Body Dressing: Supervision/safety;Sit to/from stand Lower Body Dressing Details (indicate cue type and reason): instructed to avoid bending and to cross foot over opposite knee, pt usually bends forward to dress LEs Toilet Transfer: Terrell and Hygiene: Supervision/safety;Sit to/from stand       Functional mobility during ADLs: Supervision/safety;Rolling walker  General ADL Comments: Gave pt energy conservation handout and instructed in some techiques .  Pt employs pursed lip breathing strategies already.     Vision                     Perception     Praxis      Pertinent Vitals/Pain Pain Assessment: 0-10 Pain Score: 3  Pain Location: R LE Pain Descriptors / Indicators: Aching Pain  Intervention(s): Repositioned;Monitored during session     Hand Dominance Right   Extremity/Trunk Assessment Upper Extremity Assessment Upper Extremity Assessment: Overall WFL for tasks assessed   Lower Extremity Assessment Lower Extremity Assessment: Defer to PT evaluation       Communication Communication Communication: No difficulties   Cognition Arousal/Alertness: Awake/alert Behavior During Therapy: WFL for tasks assessed/performed Overall Cognitive Status: Within Functional Limits for tasks assessed                     General Comments       Exercises       Shoulder Instructions      Home Living Family/patient expects to be discharged to:: Private residence Living Arrangements: Spouse/significant other Available Help at Discharge: Family;Available 24 hours/day Type of Home: House Home Access: Stairs to enter [employer] of Steps: 5 in garage Entrance Stairs-Rails: Right Home Layout: Two level;Able to live on main level with bedroom/bathroom     Bathroom Shower/Tub: Occupational psychologist: Standard     Home Equipment: Environmental consultant - 2 wheels;Bedside commode;Shower seat;Cane - single point;Hand held shower head;Wheelchair - manual (Oxygen)   Additional Comments: pt is 02 dependent at baseline      Prior Functioning/Environment Level of Independence: Needs assistance    ADL's / Homemaking Assistance Needed: husband assists by rinsing pt when showering, supervises shower transfer, performs cooking and housekeeping   Comments: cant stand up long due to back issues    OT Diagnosis: Generalized weakness;Acute pain   OT Problem List: Decreased strength;Decreased activity tolerance;Impaired balance (sitting and/or standing);Decreased knowledge of use of DME or AE;Cardiopulmonary status limiting activity;Pain   OT Treatment/Interventions: Self-care/ADL training;DME and/or AE instruction;Patient/family education;Energy conservation     OT Goals(Current goals can be found in the care plan section) Acute Rehab OT Goals OT Goal Formulation: With patient Time For Goal Achievement: 03/03/14 Potential to Achieve Goals: Good ADL Goals Pt Will Perform Grooming: with modified independence;standing (3 activities) Pt Will Perform Lower Body Bathing: with modified independence;sit to/from stand Pt Will Perform Lower Body Dressing: with modified independence;sit to/from stand Pt Will Transfer to Toilet: with modified independence;ambulating;regular height toilet Pt Will Perform Tub/Shower Transfer: Shower transfer;with supervision;ambulating;shower seat;rolling walker Additional ADL Goal #1: Pt will state at least 3 energy conservation strategies as instructed verbally and/or on handout.  OT Frequency: Min 2X/week   Barriers to D/C:            Co-evaluation              End of Session Equipment Utilized During Treatment: Gait belt;Rolling walker;Oxygen  Activity Tolerance: Patient limited by fatigue Patient left: in bed;with call bell/phone within reach   Time: 9678-9381 OT Time Calculation (min): 28 min Charges:  OT General Charges $OT Visit: 1 Procedure OT Evaluation $Initial OT Evaluation Tier I: 1 Procedure OT Treatments $Self Care/Home Management : 8-22 mins G-Codes:    Malka So 02/24/2014, 11:37 AM [phone]

## 2014-02-24 NOTE — Progress Notes (Signed)
Subjective: Feeling much better.  Passing the bowel movement after the gastrograffin enema resolved her pain.  Objective: Vital signs in last 24 hours: Temp:  [97.3 F (36.3 C)-98.7 F (37.1 C)] 98.7 F (37.1 C) (09/21 1750) Pulse Rate:  [94-110] 96 (09/21 1750) Resp:  [16-17] 17 (09/21 1050) BP: (114-174)/(73-86) 147/82 mmHg (09/21 1750) SpO2:  [90 %-91 %] 91 % (09/21 1750) Weight:  [67.541 kg (148 lb 14.4 oz)] 67.541 kg (148 lb 14.4 oz) (09/20 2040) Last BM Date: 02/24/14  Intake/Output from previous day: 09/20 0701 - 09/21 0700 In: 240 [P.O.:240] Out: -  Intake/Output this shift:    General appearance: alert and no distress GI: soft, non-tender; bowel sounds normal; no masses,  no organomegaly  Lab Results:  Recent Labs  02/22/14 0438 02/23/14 0609  WBC 16.0* 17.7*  HGB 10.6* 11.1*  HCT 33.6* 34.8*  PLT 213 255   BMET  Recent Labs  02/22/14 0438 02/23/14 0609  NA 145 144  K 2.8* 4.0  CL 92* 95*  CO2 36* 32  GLUCOSE 138* 88  BUN 38* 27*  CREATININE 1.89* 1.63*  CALCIUM 8.7 9.2   LFT No results found for this basename: PROT, ALBUMIN, AST, ALT, ALKPHOS, BILITOT, BILIDIR, IBILI,  in the last 72 hours PT/INR No results found for this basename: LABPROT, INR,  in the last 72 hours Hepatitis Panel No results found for this basename: HEPBSAG, HCVAB, HEPAIGM, HEPBIGM,  in the last 72 hours C-Diff No results found for this basename: CDIFFTOX,  in the last 72 hours Fecal Lactopherrin No results found for this basename: FECLLACTOFRN,  in the last 72 hours  Studies/Results: No results found.  Medications:  Scheduled: . allopurinol  100 mg Oral BID  . arformoterol  15 mcg Nebulization Daily  . aspirin  81 mg Oral Daily  . atorvastatin  40 mg Oral q1800  . calcitRIOL  0.25 mcg Oral Daily  . diltiazem  240 mg Oral Daily  . heparin  5,000 Units Subcutaneous 3 times per day  . lactose free nutrition  237 mL Oral BID BM  . nicotine  14 mg Transdermal Daily   . pantoprazole  40 mg Oral Daily  . predniSONE  20 mg Oral Q breakfast  . sodium chloride  3 mL Intravenous Q12H  . tiotropium  18 mcg Inhalation Daily  . venlafaxine XR  75 mg Oral Daily  . 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     Clinically she is well.  I believe her symptoms are secondary to her narcotics.  Since she needs to be on narcotics, she will need to be on a laxative.  Colace and Miralax are fine, but I think she can benefit with the use of Amitiza.  I have samples to provide her for her upon discharge.  Plan: 1) Colace. 2) Miralax 17 g per day.  LOS: 2 days   Cheri Ayotte D 02/24/2014, 7:03 PM

## 2014-02-24 NOTE — Progress Notes (Signed)
[facility] PROGRESS NOTE  Wanda Yang BTD:[id number] DOB: 12-07-1940 DOA: 02/22/2014 PCP: Stephens Shire, MD  Summary 15 female with MMP recently started on pain medication for leg and back pain presented to ED several times for severe constipation for over a week. CT shows concern for colonic obstruction.  Assessment/Plan:    Colonic obstruction/vs severe constipation. gastrograffin enema incomplete, but no definite mass or obstruction seen. Starting to have stools. Seen by  GI- it appears there will be no procedure so will advance diet     HYPERLIPIDEMIA   HYPERTENSION   Chronic respiratory failure- on 3L O2 at home   UTERINE CANCER, HX OF   CAD (coronary artery disease)   Atherosclerosis of abdominal aorta   Tobacco abuse   COPD (chronic obstructive pulmonary disease)   PVD (peripheral vascular disease)   GERD (gastroesophageal reflux disease)   Chronic anemia   Weakness   Hypokalemia corrected. D/c tele   Chronic diastolic CHF (congestive heart failure)   CKD (chronic kidney disease) stage 3, GFR 30-59 ml/min   Temporal arteritis Leukocytosis: no fever or signs of infection- on chronic steroids  Code Status:  full Family Communication:  No family At bedside Disposition Plan:  home  Consultants:  GI  Procedures:     Antibiotics:    HPI/Subjective: Having multiple stools Abdomen is better  Objective: Filed Vitals:   02/24/14 0421  BP: 174/73  Pulse: 98  Temp: 98.3 F (36.8 C)  Resp: 16    Intake/Output Summary (Last 24 hours) at 02/24/14 1011 Last data filed at 02/24/14 0900  Gross per 24 hour  Intake    600 ml  Output      0 ml  Net    600 ml   Filed Weights   02/22/14 1100 02/22/14 2001 02/23/14 2040  Weight: 73.619 kg (162 lb 4.8 oz) 75.524 kg (166 lb 8 oz) 67.541 kg (148 lb 14.4 oz)    Exam:   General:  In chair. comfortable  Cardiovascular: RRR  Respiratory: diminished thoughout without WRR  Abdomen: bs present.  Distended. Nontender. No rebound.  Ext: no CCE  Basic Metabolic Panel:  Recent Labs Lab 02/22/14 0438 02/22/14 1350 02/23/14 0609  NA 145  --  144  K 2.8*  --  4.0  CL 92*  --  95*  CO2 36*  --  32  GLUCOSE 138*  --  88  BUN 38*  --  27*  CREATININE 1.89*  --  1.63*  CALCIUM 8.7  --  9.2  MG  --  1.9  --    Liver Function Tests: No results found for this basename: AST, ALT, ALKPHOS, BILITOT, PROT, ALBUMIN,  in the last 168 hours No results found for this basename: LIPASE, AMYLASE,  in the last 168 hours No results found for this basename: AMMONIA,  in the last 168 hours CBC:  Recent Labs Lab 02/22/14 0438 02/23/14 0609  WBC 16.0* 17.7*  NEUTROABS 14.7*  --   HGB 10.6* 11.1*  HCT 33.6* 34.8*  MCV 98.5 99.7  PLT 213 255   Cardiac Enzymes: No results found for this basename: CKTOTAL, CKMB, CKMBINDEX, TROPONINI,  in the last 168 hours BNP (last 3 results)  Recent Labs  10/03/13 1143  PROBNP 3256.0*   CBG: No results found for this basename: GLUCAP,  in the last 168 hours  Recent Results (from the past 240 hour(s))  URINE CULTURE     Status: None   Collection Time  02/22/14  6:03 AM      Result Value Ref Range Status   Specimen Description URINE, CLEAN CATCH   Final   Special Requests Normal   Final   Culture  Setup Time     Final   Value: 02/22/2014 18:27     Performed at [facility]     Final   Value: NO GROWTH     Performed at [facility]   Culture     Final   Value: NO GROWTH     Performed at [facility]   Report Status 02/22/2014 FINAL   Final     Studies: Dg Colon W/water Sol Cm  02/22/2014   CLINICAL DATA:  Constipation with last bowel movement 7 days ago  EXAM: COLON WITH WATER soluble contrast  COMPARISON:  CT scan of the abdomen and pelvis of today's date and supine film of the abdomen also of today's date.  FINDINGS: The scout film reveals CT contrast within small and large bowel in the mid and  lower abdomen. There are known loops of mildly distended fluid filled ascending, transverse, and descending colon and considerable stool in the rectosigmoid on the CT scan.  The rectum was catheterized and the balloon inflated under fluoroscopic guidance. Filling of the rectosigmoid immediately revealed floating stool balls within the normal calibered distal and mid sigmoid. More proximally a stool ball appeared impacted in the proximal sigmoid. Some contrast did flow around this. However, at that time the patient expelled the balloon. The sigmoid then decompressed and a stool ball was passed. The rectum was again catheterized and the balloon inflated to a higher volume. Refilling of the bowel was performed. Similar findings of a normal caliber sigmoid with multiple stool balls were again demonstrated. The patient then expelled the catheter once again. The study was then terminated.  IMPRESSION: Only the sigmoid could be visualized on this study. The anal sphincter was mildly patulous. Several large stool balls were noted to be in the sigmoid both fluoroscopically and on the earlier CT scan. The visualized portions of the lumen appeared normal. Some passage of stool occurred. It is hoped that the osmotic affects of the Gastrografin well help stimulate passage of additional stool.  The patient tolerated the procedure reasonably well.   Electronically Signed   By: David  Martinique   On: 02/22/2014 12:42    Scheduled Meds: . allopurinol  100 mg Oral BID  . arformoterol  15 mcg Nebulization Daily  . aspirin  81 mg Oral Daily  . atorvastatin  40 mg Oral q1800  . calcitRIOL  0.25 mcg Oral Daily  . diltiazem  240 mg Oral Daily  . heparin  5,000 Units Subcutaneous 3 times per day  . nicotine  14 mg Transdermal Daily  . pantoprazole  40 mg Oral Daily  . predniSONE  20 mg Oral Q breakfast  . sodium chloride  3 mL Intravenous Q12H  . tiotropium  18 mcg Inhalation Daily  . venlafaxine XR  75 mg Oral Daily  .  vitamin B-12  1,000 mcg Oral Daily   Continuous Infusions:   Time spent: 25 minutes  Eliseo Squires Hedy Garro  [facility] Pager [phone]. If 7PM-7AM, please contact night-coverage at [email or web address], password [facility] 02/24/2014, 10:11 AM  LOS: 2 days

## 2014-02-24 NOTE — Progress Notes (Signed)
INITIAL NUTRITION ASSESSMENT  DOCUMENTATION CODES Per approved criteria  -Not Applicable   INTERVENTION: Provide Boost Plus po BID, each supplement provides 360 kcal and 14 grams of protein.  NUTRITION DIAGNOSIS: Increased nutrient needs related to chronic illness as evidenced by estimated nutrition needs.   Goal: Pt to meet >/= 90% of their estimated nutrition needs.  Monitor:  PO intake, weight trends, labs, I/O's  Reason for Assessment: MST  73 y.o. female  Admitting Dx: Colonic obstruction  ASSESSMENT: Pt with history of peripheral vascular disease, coronary artery disease, chronic kidney disease, tobacco abuse with COPD and chronic hypoxic respiratory failure, temporal arteritis who presents with constipation for 8 days. CAT scan was done which showed possible colonic obstruction but no definite mass.  Pt reports having a good appetite. Pt just advanced to a soft diet from a clear liquids diet. Pt reports she has been having diarrhea most of the day yesterday, but now is feeling better and has no stomach pains. Meal completions on her clear liquids diet is 65-75%. Pt reports she has also been eating well at home eating 2 full meals a day along with 1-2 Boosts a day. Pt reports no recent weight loss with her usual body weight of 160 lbs. Question current accuracy of recorded weight as bed scale reported weight to be around 170 lbs. Pt reports she would like Boost ordered while hospitalized. Will order.  Pt with no significant fat or muscle mass loss.   Labs: Low chloride and GFR.  High BUN and creatinine.   Height: Ht Readings from Last 1 Encounters:  02/22/14 5\' 8"  (1.727 m)    Weight: Wt Readings from Last 1 Encounters:  02/23/14 148 lb 14.4 oz (67.541 kg)    Ideal Body Weight: 140 lbs  % Ideal Body Weight: 95%  Wt Readings from Last 10 Encounters:  02/23/14 148 lb 14.4 oz (67.541 kg)  02/05/14 160 lb (72.576 kg)  01/20/14 159 lb 1.9 oz (72.176 kg)  11/12/13  168 lb (76.204 kg)  11/04/13 171 lb 14.4 oz (77.973 kg)  10/05/13 166 lb 8 oz (75.524 kg)  10/05/13 166 lb 8 oz (75.524 kg)  08/20/13 179 lb (81.194 kg)  07/15/13 177 lb (80.287 kg)  01/29/13 184 lb 9.6 oz (83.734 kg)    Usual Body Weight: 160 lbs  % Usual Body Weight: 93%  BMI:  Body mass index is 22.65 kg/(m^2).  Estimated Nutritional Needs: Kcal: 3220-2542 Protein: 75-85 grams Fluid: 1.7 - 1.9 L/day  Skin: wound on bilateral LE, non-pitting LE edema  Diet Order: Criss Rosales  EDUCATION NEEDS: -No education needs identified at this time   Intake/Output Summary (Last 24 hours) at 02/24/14 1428 Last data filed at 02/24/14 0900  Gross per 24 hour  Intake    600 ml  Output      0 ml  Net    600 ml    Last BM: 9/20  Labs:   Recent Labs Lab 02/22/14 0438 02/22/14 1350 02/23/14 0609  NA 145  --  144  K 2.8*  --  4.0  CL 92*  --  95*  CO2 36*  --  32  BUN 38*  --  27*  CREATININE 1.89*  --  1.63*  CALCIUM 8.7  --  9.2  MG  --  1.9  --   GLUCOSE 138*  --  88    CBG (last 3)  No results found for this basename: GLUCAP,  in the last 72 hours  Scheduled  Meds: . allopurinol  100 mg Oral BID  . arformoterol  15 mcg Nebulization Daily  . aspirin  81 mg Oral Daily  . atorvastatin  40 mg Oral q1800  . calcitRIOL  0.25 mcg Oral Daily  . diltiazem  240 mg Oral Daily  . heparin  5,000 Units Subcutaneous 3 times per day  . nicotine  14 mg Transdermal Daily  . pantoprazole  40 mg Oral Daily  . predniSONE  20 mg Oral Q breakfast  . sodium chloride  3 mL Intravenous Q12H  . tiotropium  18 mcg Inhalation Daily  . venlafaxine XR  75 mg Oral Daily  . vitamin B-12  1,000 mcg Oral Daily    Continuous Infusions:   Past Medical History  Diagnosis Date  . CAD (coronary artery disease)   . Atherosclerosis of abdominal aorta   . On home oxygen therapy     "3L; 24/7" (10/03/2013)  . Tobacco abuse   . HTN (hypertension)   . COPD (chronic obstructive pulmonary disease)    . PVD (peripheral vascular disease)   . Anxiety   . GERD (gastroesophageal reflux disease)   . RBBB (right bundle branch block)   . Tachycardia   . High cholesterol   . Leukocytosis   . CHF (congestive heart failure)   . Shortness of breath   . UTI (urinary tract infection)   . CKD (chronic kidney disease), stage IV   . Temporal arteritis dx'd 08/2013  . Endometrial carcinoma   . Uterine cancer   . Myocardial infarction ~ 2011    " MILD "  . Anemia   . History of blood transfusion     "S/P anemia for 3 years"  . Daily headache     "last few days" (10/03/2013)  . Arthritis     "hands and fingers" (10/03/2013)  . Chronic lower back pain   . Depression     "sometimes; not often" (10/03/2013)  . Lower GI bleeding 02/2012; 10/03/2013    Past Surgical History  Procedure Laterality Date  . Carotid stent    . Tonsillectomy    . Forearm fracture surgery Left ~ 2005    PLATES AND SCREWS  . Femoral artery stent    . Reduction mammaplasty    . Wrist fracture surgery Left ~ 2005    "it was crushed; they glued it together"  . Coronary angioplasty with stent placement      "1 + +1"  . Iliac artery stent Bilateral   . Esophagogastroduodenoscopy N/A 10/04/2013    Procedure: ESOPHAGOGASTRODUODENOSCOPY (EGD);  Surgeon: Beryle Beams, MD;  Location: [facility] ENDOSCOPY;  Service: Endoscopy;  Laterality: N/A;  . Vaginal hysterectomy      for cancer    Kallie Locks, MS, Provisional LDN Pager # [phone] After hours/ weekend pager # [phone]

## 2014-02-25 LAB — CBC
HCT: 31.2 % — ABNORMAL LOW (ref 36.0–46.0)
HEMOGLOBIN: 9.8 g/dL — AB (ref 12.0–15.0)
MCH: 31.2 pg (ref 26.0–34.0)
MCHC: 31.4 g/dL (ref 30.0–36.0)
MCV: 99.4 fL (ref 78.0–100.0)
Platelets: 218 10*3/uL (ref 150–400)
RBC: 3.14 MIL/uL — ABNORMAL LOW (ref 3.87–5.11)
RDW: 17.7 % — ABNORMAL HIGH (ref 11.5–15.5)
WBC: 12.1 10*3/uL — ABNORMAL HIGH (ref 4.0–10.5)

## 2014-02-25 LAB — BASIC METABOLIC PANEL
Anion gap: 12 (ref 5–15)
BUN: 25 mg/dL — AB (ref 6–23)
CALCIUM: 9.1 mg/dL (ref 8.4–10.5)
CO2: 31 mEq/L (ref 19–32)
Chloride: 94 mEq/L — ABNORMAL LOW (ref 96–112)
Creatinine, Ser: 1.9 mg/dL — ABNORMAL HIGH (ref 0.50–1.10)
GFR calc Af Amer: 29 mL/min — ABNORMAL LOW (ref >90)
GFR, EST NON AFRICAN AMERICAN: 25 mL/min — AB (ref >90)
Glucose, Bld: 96 mg/dL (ref 70–99)
Potassium: 3.8 mEq/L (ref 3.7–5.3)
SODIUM: 137 meq/L (ref 137–147)

## 2014-02-25 MED ORDER — SENNA 8.6 MG PO TABS: 2.0000 | ORAL_TABLET | Freq: Every day | ORAL | Status: DC | PRN

## 2014-02-25 MED ORDER — BOOST PLUS PO LIQD: 237.0000 mL | Freq: Two times a day (BID) | ORAL | Status: DC

## 2014-02-25 MED ORDER — PREDNISONE 20 MG PO TABS
40.0000 mg | ORAL_TABLET | Freq: Every day | ORAL | Status: DC
  Administered 2014-02-25: 40 mg via ORAL
  Filled 2014-02-25 (×2): qty 2

## 2014-02-25 MED ORDER — NICOTINE 14 MG/24HR TD PT24: 14.0000 mg | MEDICATED_PATCH | Freq: Every day | TRANSDERMAL | Status: DC

## 2014-02-25 MED ORDER — PREDNISONE 20 MG PO TABS: ORAL_TABLET | ORAL | Status: DC

## 2014-02-25 NOTE — Progress Notes (Signed)
Patient Discharge: Disposition:  Patient discharged to home with husband Education: Patient and husband educated about the follow-up appointment, prescriptions, medications, discharge instructions, and both understood and acknowledged. IV: Removed peripheral IV before discharge. Transportation: Patient transported via w/c accompanied by the staff and husband. Belongings: Patient took all her belongings with her.

## 2014-02-25 NOTE — Progress Notes (Signed)
CARE MANAGEMENT NOTE 02/25/2014  Patient:  Wanda Yang, Wanda Yang   Account Number:  [id number]  Date Initiated:  02/25/2014  Documentation initiated by:  Lizabeth Leyden  Subjective/Objective Assessment:   admitted with colonic obstruction     Action/Plan:   progression of care and discharge planning   Anticipated DC Date:  02/26/2014   Anticipated DC Plan:  [place]  CM consult      Choice offered to / List presented to:             Status of service:  In process, will continue to follow Medicare Important Message given?   (If response is "NO", the following Medicare IM given date fields will be blank) Date Medicare IM given:   Medicare IM given by:   Date Additional Medicare IM given:   Additional Medicare IM given by:    Discharge Disposition:    Per UR Regulation:    If discussed at Long Length of Stay Meetings, dates discussed:    Comments:  02/25/2014  [place], [place] [employer] CM: Leeroy Bock [phone] called, left voice mail message regarding discharge planning, no orders at this time, may have [place].  02/25/2014  [place], [place] Met with patient regarding discharge planning. She has [place] for O2 at home, 24/7 @ 3L DME: RW and 3:1 at home

## 2014-02-25 NOTE — Progress Notes (Signed)
Note/chart reviewed.  Wanda Yang, RD, LDN Pager #: [phone] After-Hours Pager #: [phone]  

## 2014-02-26 NOTE — Discharge Summary (Signed)
Physician Discharge Summary  Wanda Yang KZL:[id number] DOB: [birth year] DOA: 02/22/2014  PCP: Stephens Shire, MD  Admit date: 02/22/2014 Discharge date: 02/26/2014  Time spent: 35 minutes  Recommendations for Outpatient Follow-up:  1. Bowel regimen for daily BMs  Discharge Diagnoses:  Principal Problem:   Colonic obstruction Active Problems:   HYPERLIPIDEMIA   HYPERTENSION   Chronic respiratory failure   UTERINE CANCER, HX OF   CAD (coronary artery disease)   Atherosclerosis of abdominal aorta   Tobacco abuse   COPD (chronic obstructive pulmonary disease)   PVD (peripheral vascular disease)   GERD (gastroesophageal reflux disease)   Chronic anemia   Weakness   Hypokalemia   Chronic diastolic CHF (congestive heart failure)   CKD (chronic kidney disease) stage 3, GFR 30-59 ml/min   Temporal arteritis   Acute constipation   Discharge Condition: improved  Diet recommendation:   Filed Weights   02/22/14 2001 02/23/14 2040 02/24/14 2150  Weight: 75.524 kg (166 lb 8 oz) 67.541 kg (148 lb 14.4 oz) 67.8 kg (149 lb 7.6 oz)    History of present illness:  Wanda Yang is a 73 y.o. female  With history of peripheral vascular disease, coronary artery disease, chronic kidney disease, tobacco abuse with COPD and chronic hypoxic respiratory failure, temporal arteritis who presents with constipation for 8 days. She was recently given a prescription for oxycodone for back pain. She has a history of chronic constipation. She was evaluated in the emergency room 2 days ago, x-ray showed prominent stool burden. She was given a prescription for GoLYTELY. She had no results. She returned to the emergency room. She received milk of magnesia enema, mineral oil enema, with minimal results. Eventually, CAT scan was done which showed possible colonic obstruction but no definite mass. Her appetite has been poor and she has not been eating well. She feels weak. She has had EGD by Dr. Benson Norway and  has seen Dr. Mickle Mallory in the office previously. CT virtual colonoscopy from 2013 shows 2 foci of soft tissue density within the distal colon, could represent non-tach to stool or colonic polyps.  Also, her potassium is noted to be 2.8.   Hospital Course:  Constipation secondary to opioids.  -symptoms are secondary to her narcotics.  - she will need to be on a laxative: Colace and Miralax  -benefit with the use of Amitiza:  Dr. Benson Norway has samples to provide her for her upon discharge.   Procedures:    Consultations:  GI  Discharge Exam: Filed Vitals:   02/25/14 1009  BP: 136/71  Pulse: 96  Temp: 97.5 F (36.4 C)  Resp: 19    General: A+Ox3, NAD Cardiovascular: rrr Respiratory: decreased, minimal wheezing  Discharge Instructions You were cared for by a hospitalist during your hospital stay. If you have any questions about your discharge medications or the care you received while you were in the hospital after you are discharged, you can call the unit and asked to speak with the hospitalist on call if the hospitalist that took care of you is not available. Once you are discharged, your primary care physician will handle any further medical issues. Please note that NO REFILLS for any discharge medications will be authorized once you are discharged, as it is imperative that you return to your primary care physician (or establish a relationship with a primary care physician if you do not have one) for your aftercare needs so that they can reassess your need for medications and monitor your lab  values.  Discharge Instructions   Diet - low sodium heart healthy    Complete by:  As directed      Discharge instructions    Complete by:  As directed   Continue home O2 Needs BM daily- use PRN mirlax, coalse Dr. Benson Norway has samples of Amitiza Limit narcotic use     Increase activity slowly    Complete by:  As directed           Discharge Medication List as of 02/25/2014  2:59 PM    START  taking these medications   Details  lactose free nutrition (BOOST PLUS) LIQD Take 237 mLs by mouth 2 (two) times daily between meals., Starting 02/25/2014, Until Discontinued, Print    nicotine (NICODERM CQ - DOSED IN MG/24 HOURS) 14 mg/24hr patch Place 1 patch (14 mg total) onto the skin daily., Starting 02/25/2014, Until Discontinued, Print    senna (SENOKOT) 8.6 MG TABS tablet Take 2 tablets (17.2 mg total) by mouth daily as needed for mild constipation., Starting 02/25/2014, Until Discontinued, Print      CONTINUE these medications which have CHANGED   Details  predniSONE (DELTASONE) 20 MG tablet 40 mg x 5 days, then 30 mg x 5 days, then back to 20 mg daily, Print      CONTINUE these medications which have NOT CHANGED   Details  albuterol (PROAIR HFA) 108 (90 BASE) MCG/ACT inhaler Inhale 2 puffs into the lungs every 4 (four) hours as needed for wheezing or shortness of breath., Until Discontinued, Historical Med    allopurinol (ZYLOPRIM) 100 MG tablet Take 100 mg by mouth 2 (two) times daily.  , Until Discontinued, Historical Med    arformoterol (BROVANA) 15 MCG/2ML NEBU Take 15 mcg by nebulization daily., Until Discontinued, Historical Med    aspirin 81 MG tablet Take 1 tablet (81 mg total) by mouth daily., Starting 10/17/2013, Until Discontinued, No Print    budesonide (PULMICORT) 0.5 MG/2ML nebulizer solution Take 0.5 mg by nebulization 2 (two) times daily as needed (shortness of breath)., Until Discontinued, Historical Med    calcitRIOL (ROCALTROL) 0.25 MCG capsule Take 0.25 mcg by mouth daily., Until Discontinued, Historical Med    clopidogrel (PLAVIX) 75 MG tablet Take 1 tablet (75 mg total) by mouth daily., Starting 10/17/2013, Until Discontinued, Normal    diltiazem (DILACOR XR) 240 MG 24 hr capsule Take 1 capsule (240 mg total) by mouth daily., Starting 07/15/2013, Until Discontinued, Normal    famotidine (PEPCID) 20 MG tablet Take 20 mg by mouth 2 (two) times daily as needed  for heartburn. , Until Discontinued, Historical Med    ferrous sulfate 325 (65 FE) MG tablet Take 1 tablet (325 mg total) by mouth daily with breakfast., Starting 10/05/2013, Until Discontinued, Print    furosemide (LASIX) 80 MG tablet Take 80-160 mg by mouth 2 (two) times daily. 160mg  in the morning and 80mg  in the evening, Until Discontinued, Historical Med    methocarbamol (ROBAXIN) 500 MG tablet Take 1 tablet (500 mg total) by mouth 2 (two) times daily as needed for muscle spasms., Starting 02/10/2014, Until Discontinued, Print    nitroGLYCERIN (NITROSTAT) 0.4 MG SL tablet Place 0.4 mg under the tongue every 5 (five) minutes as needed for chest pain., Until Discontinued, Historical Med    pantoprazole (PROTONIX) 40 MG tablet Take 40 mg by mouth daily., Until Discontinued, Historical Med    polyethylene glycol (MIRALAX / GLYCOLAX) packet Take 34 g by mouth daily as needed for mild constipation., Until  Discontinued, Historical Med    Probiotic Product (ALIGN PO) Take 1 tablet by mouth daily. , Until Discontinued, Historical Med    rosuvastatin (CRESTOR) 20 MG tablet Take 1 tablet (20 mg total) by mouth daily., Starting 07/15/2013, Until Discontinued, Normal    tiotropium (SPIRIVA) 18 MCG inhalation capsule Place 18 mcg into inhaler and inhale daily., Until Discontinued, Historical Med    traMADol (ULTRAM) 50 MG tablet Take 50 mg by mouth every 6 (six) hours as needed for moderate pain. , Until Discontinued, Historical Med    venlafaxine XR (EFFEXOR-XR) 75 MG 24 hr capsule Take 75 mg by mouth daily. , Until Discontinued, Historical Med    vitamin B-12 (CYANOCOBALAMIN) 1000 MCG tablet Take 1,000 mcg by mouth daily., Until Discontinued, Historical Med    sodium phosphate (FLEET) 7-19 GM/118ML ENEM Place 133 mLs (1 enema total) rectally daily as needed for severe constipation., Starting 02/20/2014, Until Discontinued, Print      STOP taking these medications     oxyCODONE-acetaminophen  (PERCOCET/ROXICET) 5-325 MG per tablet      polyethylene glycol (GOLYTELY) 236 G solution        Allergies  Allergen Reactions  . Uloric [Febuxostat] Other (See Comments)    Unknown reaction  . Zolpidem Tartrate Other (See Comments)    hallucinations  . Ciprofloxacin Anxiety and Other (See Comments)    Shakiness  . Contrast Media [Iodinated Diagnostic Agents] Rash  . Tetracycline Rash  . Vicodin [Hydrocodone-Acetaminophen] Anxiety   Follow-up Information   Follow up with BURNETT,BRENT A, MD In 1 week.   Specialty:  Family Medicine   Contact information:   [address] [place] [address] [phone]        The results of significant diagnostics from this hospitalization (including imaging, microbiology, ancillary and laboratory) are listed below for reference.    Significant Diagnostic Studies: Ct Abdomen Pelvis Wo Contrast  02/22/2014   CLINICAL DATA:  Left lower quadrant abdominal pain. Renal insufficiency.  EXAM: CT ABDOMEN AND PELVIS WITHOUT CONTRAST  TECHNIQUE: Multidetector CT imaging of the abdomen and pelvis was performed following the standard protocol without IV contrast.  COMPARISON:  Radiographs obtained earlier today. CT colonoscopy dated 02/13/2012.  FINDINGS: Dilated, fluid-filled colon containing loculated barium and stool. This becomes progressive lead less dilated extending into the sigmoid region width a transition to normal caliber sigmoid colon in the posterior aspect of the lower pelvis on the right. No small bowel dilatation. Normal passage oval contrast through the small bowel.  Bilateral renal cysts. Right renal atrophy. Distal abdominal aortic aneurysm with a maximum AP diameter of 3.9 cm on image number 32. No significant change in low density left adrenal masses. The largest measures 2.2 x 1.2 cm on image number 19 and 2 Hounsfield units in density on image 20. The next largest measures 1.3 x 1.2 cm on image number 16 and 10 Hounsfield  units in density.  Unremarkable non contrasted appearance of the liver, spleen, pancreas, right adrenal gland an urinary bladder. Surgically absent uterus. No adnexal masses or enlarged lymph nodes. Distended gallbladder with no visible gallstones, wall thickening or pericholecystic fluid. Changes of COPD and chronic bronchitis at the lung bases with bibasilar linear atelectasis and scarring. Lumbar and lower thoracic spine degenerative changes.  IMPRESSION: 1. Colonic obstruction at the level of the distal sigmoid colon with prominent stool within that portion of the colon. This may be due to a stricture, not previously present. No visible mass. 2. 3.9  cm distal abdominal aortic aneurysm. 3. Left adrenal adenomas. 4. Bilateral renal cysts and right renal atrophy. 5. COPD and chronic bronchitis. 6. Distended gallbladder   Electronically Signed   By: Enrique Sack M.D.   On: 02/22/2014 09:17   Dg Hip Complete Left  02/10/2014   CLINICAL DATA:  Left hip and leg pain.  EXAM: LEFT HIP - COMPLETE 2+ VIEW  COMPARISON:  None.  FINDINGS: No acute fracture or dislocation is identified. No significant arthropathy. The bony pelvis appears intact. No bony lesions or destruction. Bilateral iliac arterial stents identified. Iliac and femoral arteries are extensively calcified. No soft tissue abnormalities.  IMPRESSION: No acute fracture or significant arthropathy.   Electronically Signed   By: Aletta Edouard M.D.   On: 02/10/2014 13:25   Dg Femur Left  02/10/2014   CLINICAL DATA:  Left leg pain for 2 days, no trauma.  EXAM: LEFT FEMUR - 2 VIEW  COMPARISON:  None.  FINDINGS: There is no evidence of fracture or other focal bone lesions. Soft tissues are unremarkable. Vascular calcifications and left iliac stent are noted.  IMPRESSION: Negative.   Electronically Signed   By: Conchita Paris M.D.   On: 02/10/2014 13:24   Dg Abd 1 View  02/20/2014   CLINICAL DATA:  Decreased urine output. History of uterine cancer. Constipation.   EXAM: ABDOMEN - 1 VIEW  COMPARISON:  11/01/2012  FINDINGS: Right common and left external iliac artery stents. Atherosclerotic vascular calcifications.  Prominent stool throughout the colon favors constipation. No dilated small bowel observed. No compelling findings of renal or ureteral calculi.  IMPRESSION: 1.  Prominent stool throughout the colon favors constipation. 2. Atherosclerosis with iliac stents bilaterally.   Electronically Signed   By: Sherryl Barters M.D.   On: 02/20/2014 15:57   Dg Abd Acute W/chest  02/22/2014   CLINICAL DATA:  Evaluate for small bowel obstruction.  EXAM: ACUTE ABDOMEN SERIES (ABDOMEN 2 VIEW & CHEST 1 VIEW)  COMPARISON:  Abdominal radiograph February 20, 2014 and chest radiograph October 03, 2013  FINDINGS: The cardiac silhouette is mildly enlarged, similar. Similar moderate interstitial prominence, increased lung lung volumes with linear bibasilar densities. No pleural effusions or focal consolidations. No pneumothorax. Soft tissue planes and included osseous structures are nonsuspicious.  Paucity of bowel gas. A few scattered air-fluid levels on the lateral cubitus view, no intraperitoneal free air. RIGHT Common iliac and LEFT apparent external iliac artery stents. Moderate vascular calcifications. Bone mineral density is decreased without destructive bony lesions.  IMPRESSION: Mild cardiomegaly, stable COPD and bibasilar atelectasis versus scarring.  Paucity of bowel gas, nonspecific bowel gas pattern. Fluid filled small bowel obstruction not excluded, if clinically indicated CT of the abdomen and pelvis with contrast would be more sensitive.   Electronically Signed   By: Elon Alas   On: 02/22/2014 05:34   Dg Colon W/water Sol Cm  02/22/2014   CLINICAL DATA:  Constipation with last bowel movement 7 days ago  EXAM: COLON WITH WATER soluble contrast  COMPARISON:  CT scan of the abdomen and pelvis of today's date and supine film of the abdomen also of today's date.   FINDINGS: The scout film reveals CT contrast within small and large bowel in the mid and lower abdomen. There are known loops of mildly distended fluid filled ascending, transverse, and descending colon and considerable stool in the rectosigmoid on the CT scan.  The rectum was catheterized and the balloon inflated under fluoroscopic guidance. Filling of the rectosigmoid immediately  revealed floating stool balls within the normal calibered distal and mid sigmoid. More proximally a stool ball appeared impacted in the proximal sigmoid. Some contrast did flow around this. However, at that time the patient expelled the balloon. The sigmoid then decompressed and a stool ball was passed. The rectum was again catheterized and the balloon inflated to a higher volume. Refilling of the bowel was performed. Similar findings of a normal caliber sigmoid with multiple stool balls were again demonstrated. The patient then expelled the catheter once again. The study was then terminated.  IMPRESSION: Only the sigmoid could be visualized on this study. The anal sphincter was mildly patulous. Several large stool balls were noted to be in the sigmoid both fluoroscopically and on the earlier CT scan. The visualized portions of the lumen appeared normal. Some passage of stool occurred. It is hoped that the osmotic affects of the Gastrografin well help stimulate passage of additional stool.  The patient tolerated the procedure reasonably well.   Electronically Signed   By: David  Martinique   On: 02/22/2014 12:42    Microbiology: Recent Results (from the past 240 hour(s))  URINE CULTURE     Status: None   Collection Time    02/22/14  6:03 AM      Result Value Ref Range Status   Specimen Description URINE, CLEAN CATCH   Final   Special Requests Normal   Final   Culture  Setup Time     Final   Value: 02/22/2014 18:27     Performed at [facility]     Final   Value: NO GROWTH     Performed at [facility]   Culture     Final   Value: NO GROWTH     Performed at [facility]   Report Status 02/22/2014 FINAL   Final     Labs: Basic Metabolic Panel:  Recent Labs Lab 02/22/14 0438 02/22/14 1350 02/23/14 0609 02/25/14 0550  NA 145  --  144 137  K 2.8*  --  4.0 3.8  CL 92*  --  95* 94*  CO2 36*  --  32 31  GLUCOSE 138*  --  88 96  BUN 38*  --  27* 25*  CREATININE 1.89*  --  1.63* 1.90*  CALCIUM 8.7  --  9.2 9.1  MG  --  1.9  --   --    Liver Function Tests: No results found for this basename: AST, ALT, ALKPHOS, BILITOT, PROT, ALBUMIN,  in the last 168 hours No results found for this basename: LIPASE, AMYLASE,  in the last 168 hours No results found for this basename: AMMONIA,  in the last 168 hours CBC:  Recent Labs Lab 02/22/14 0438 02/23/14 0609 02/25/14 0550  WBC 16.0* 17.7* 12.1*  NEUTROABS 14.7*  --   --   HGB 10.6* 11.1* 9.8*  HCT 33.6* 34.8* 31.2*  MCV 98.5 99.7 99.4  PLT 213 255 218   Cardiac Enzymes: No results found for this basename: CKTOTAL, CKMB, CKMBINDEX, TROPONINI,  in the last 168 hours BNP: BNP (last 3 results)  Recent Labs  10/03/13 1143  PROBNP 3256.0*   CBG: No results found for this basename: GLUCAP,  in the last 168 hours     Signed:  Eulogio Bear  [facility] 02/26/2014, 4:48 PM

## 2014-02-27 ENCOUNTER — Other Ambulatory Visit: Payer: Self-pay | Admitting: Family Medicine

## 2014-02-27 DIAGNOSIS — M545 Low back pain, unspecified: Secondary | ICD-10-CM

## 2014-02-28 ENCOUNTER — Telehealth: Payer: Self-pay | Admitting: Pulmonary Disease

## 2014-02-28 NOTE — Telephone Encounter (Signed)
Called and spoke with pts husband and he is aware of MW recs.  He will continue to monitor her and if she does become distressed he will either take her to the ER or call EMS for eval.

## 2014-02-28 NOTE — Telephone Encounter (Signed)
Called and spoke with pts husband.  He stated that the pt has been in and out of the hospital recently a couple of times.  Pt stated that her oxygen levels were fine yesterday but last night she was started on abx for the cough that she has with thick, white mucus.  Today her oxygen levels while laying down were at 87% on 4 liters.  Pt is always on 4 liters--  24/7.  She stated that her oxygen will get up to 93% with the 4 liters but then drops back down between 85%-92% with HR of 101 (she stated that her HR is always like this).  [facility] is out of the office today.  MW please advise. Thanks  Allergies  Allergen Reactions  . Uloric [Febuxostat] Other (See Comments)    Unknown reaction  . Zolpidem Tartrate Other (See Comments)    hallucinations  . Ciprofloxacin Anxiety and Other (See Comments)    Shakiness  . Contrast Media [Iodinated Diagnostic Agents] Rash  . Tetracycline Rash  . Vicodin [Hydrocodone-Acetaminophen] Anxiety    Current Outpatient Prescriptions on File Prior to Visit  Medication Sig Dispense Refill  . albuterol (PROAIR HFA) 108 (90 BASE) MCG/ACT inhaler Inhale 2 puffs into the lungs every 4 (four) hours as needed for wheezing or shortness of breath.      . allopurinol (ZYLOPRIM) 100 MG tablet Take 100 mg by mouth 2 (two) times daily.        Marland Kitchen arformoterol (BROVANA) 15 MCG/2ML NEBU Take 15 mcg by nebulization daily.      Marland Kitchen aspirin 81 MG tablet Take 1 tablet (81 mg total) by mouth daily.  30 tablet    . budesonide (PULMICORT) 0.5 MG/2ML nebulizer solution Take 0.5 mg by nebulization 2 (two) times daily as needed (shortness of breath).      . calcitRIOL (ROCALTROL) 0.25 MCG capsule Take 0.25 mcg by mouth daily.      . clopidogrel (PLAVIX) 75 MG tablet Take 1 tablet (75 mg total) by mouth daily.  90 tablet  3  . diltiazem (DILACOR XR) 240 MG 24 hr capsule Take 1 capsule (240 mg total) by mouth daily.  90 capsule  3  . famotidine (PEPCID) 20 MG tablet Take 20 mg by mouth 2 (two) times  daily as needed for heartburn.       . ferrous sulfate 325 (65 FE) MG tablet Take 1 tablet (325 mg total) by mouth daily with breakfast.  90 tablet  3  . furosemide (LASIX) 80 MG tablet Take 80-160 mg by mouth 2 (two) times daily. 160mg  in the morning and 80mg  in the evening      . lactose free nutrition (BOOST PLUS) LIQD Take 237 mLs by mouth 2 (two) times daily between meals.  20 Can  0  . methocarbamol (ROBAXIN) 500 MG tablet Take 1 tablet (500 mg total) by mouth 2 (two) times daily as needed for muscle spasms.  20 tablet  0  . nicotine (NICODERM CQ - DOSED IN MG/24 HOURS) 14 mg/24hr patch Place 1 patch (14 mg total) onto the skin daily.  28 patch  0  . nitroGLYCERIN (NITROSTAT) 0.4 MG SL tablet Place 0.4 mg under the tongue every 5 (five) minutes as needed for chest pain.      . pantoprazole (PROTONIX) 40 MG tablet Take 40 mg by mouth daily.      . polyethylene glycol (MIRALAX / GLYCOLAX) packet Take 34 g by mouth daily as needed for mild constipation.      Marland Kitchen  predniSONE (DELTASONE) 20 MG tablet 40 mg x 5 days, then 30 mg x 5 days, then back to 20 mg daily  60 tablet  0  . Probiotic Product (ALIGN PO) Take 1 tablet by mouth daily.       . rosuvastatin (CRESTOR) 20 MG tablet Take 1 tablet (20 mg total) by mouth daily.  90 tablet  3  . senna (SENOKOT) 8.6 MG TABS tablet Take 2 tablets (17.2 mg total) by mouth daily as needed for mild constipation.  120 each  0  . sodium phosphate (FLEET) 7-19 GM/118ML ENEM Place 133 mLs (1 enema total) rectally daily as needed for severe constipation.  230 mL  0  . tiotropium (SPIRIVA) 18 MCG inhalation capsule Place 18 mcg into inhaler and inhale daily.      . traMADol (ULTRAM) 50 MG tablet Take 50 mg by mouth every 6 (six) hours as needed for moderate pain.       Marland Kitchen venlafaxine XR (EFFEXOR-XR) 75 MG 24 hr capsule Take 75 mg by mouth daily.       . vitamin B-12 (CYANOCOBALAMIN) 1000 MCG tablet Take 1,000 mcg by mouth daily.       No current facility-administered  medications on file prior to visit.

## 2014-02-28 NOTE — Telephone Encounter (Signed)
Pt has mild hypercarbia so this is ok but if she gets into any resp distress over the weekend will need to go directly to the er

## 2014-03-05 ENCOUNTER — Ambulatory Visit: Payer: Federal, State, Local not specified - PPO | Admitting: Pulmonary Disease

## 2014-03-05 ENCOUNTER — Encounter (HOSPITAL_COMMUNITY): Payer: Self-pay | Admitting: Emergency Medicine

## 2014-03-05 ENCOUNTER — Emergency Department (HOSPITAL_COMMUNITY): Payer: Medicare (Managed Care)

## 2014-03-05 ENCOUNTER — Inpatient Hospital Stay (HOSPITAL_COMMUNITY)
Admission: EM | Admit: 2014-03-05 | Discharge: 2014-03-11 | DRG: 189 | Disposition: A | Discharge status: Hospice | Payer: Medicare (Managed Care) | Attending: Internal Medicine | Admitting: Internal Medicine

## 2014-03-05 ENCOUNTER — Inpatient Hospital Stay (HOSPITAL_COMMUNITY): Payer: Medicare (Managed Care)

## 2014-03-05 DIAGNOSIS — Z7982 Long term (current) use of aspirin: Secondary | ICD-10-CM | POA: Diagnosis not present

## 2014-03-05 DIAGNOSIS — I129 Hypertensive chronic kidney disease with stage 1 through stage 4 chronic kidney disease, or unspecified chronic kidney disease: Secondary | ICD-10-CM | POA: Diagnosis present

## 2014-03-05 DIAGNOSIS — N183 Chronic kidney disease, stage 3 unspecified: Secondary | ICD-10-CM

## 2014-03-05 DIAGNOSIS — I10 Essential (primary) hypertension: Secondary | ICD-10-CM

## 2014-03-05 DIAGNOSIS — Z8542 Personal history of malignant neoplasm of other parts of uterus: Secondary | ICD-10-CM | POA: Diagnosis not present

## 2014-03-05 DIAGNOSIS — R0602 Shortness of breath: Secondary | ICD-10-CM | POA: Diagnosis not present

## 2014-03-05 DIAGNOSIS — J9621 Acute and chronic respiratory failure with hypoxia: Secondary | ICD-10-CM | POA: Diagnosis present

## 2014-03-05 DIAGNOSIS — I2510 Atherosclerotic heart disease of native coronary artery without angina pectoris: Secondary | ICD-10-CM | POA: Diagnosis present

## 2014-03-05 DIAGNOSIS — I5033 Acute on chronic diastolic (congestive) heart failure: Secondary | ICD-10-CM

## 2014-03-05 DIAGNOSIS — I776 Arteritis, unspecified: Secondary | ICD-10-CM | POA: Diagnosis present

## 2014-03-05 DIAGNOSIS — E876 Hypokalemia: Secondary | ICD-10-CM

## 2014-03-05 DIAGNOSIS — D638 Anemia in other chronic diseases classified elsewhere: Secondary | ICD-10-CM | POA: Diagnosis present

## 2014-03-05 DIAGNOSIS — Z79899 Other long term (current) drug therapy: Secondary | ICD-10-CM | POA: Diagnosis not present

## 2014-03-05 DIAGNOSIS — Z7902 Long term (current) use of antithrombotics/antiplatelets: Secondary | ICD-10-CM

## 2014-03-05 DIAGNOSIS — Z955 Presence of coronary angioplasty implant and graft: Secondary | ICD-10-CM | POA: Diagnosis not present

## 2014-03-05 DIAGNOSIS — M4806 Spinal stenosis, lumbar region: Secondary | ICD-10-CM | POA: Diagnosis present

## 2014-03-05 DIAGNOSIS — Z66 Do not resuscitate: Secondary | ICD-10-CM | POA: Diagnosis not present

## 2014-03-05 DIAGNOSIS — I739 Peripheral vascular disease, unspecified: Secondary | ICD-10-CM | POA: Diagnosis present

## 2014-03-05 DIAGNOSIS — E871 Hypo-osmolality and hyponatremia: Secondary | ICD-10-CM | POA: Diagnosis present

## 2014-03-05 DIAGNOSIS — I248 Other forms of acute ischemic heart disease: Secondary | ICD-10-CM | POA: Diagnosis present

## 2014-03-05 DIAGNOSIS — I252 Old myocardial infarction: Secondary | ICD-10-CM

## 2014-03-05 DIAGNOSIS — J441 Chronic obstructive pulmonary disease with (acute) exacerbation: Secondary | ICD-10-CM | POA: Diagnosis present

## 2014-03-05 DIAGNOSIS — Z9981 Dependence on supplemental oxygen: Secondary | ICD-10-CM | POA: Diagnosis not present

## 2014-03-05 DIAGNOSIS — J9620 Acute and chronic respiratory failure, unspecified whether with hypoxia or hypercapnia: Secondary | ICD-10-CM | POA: Diagnosis present

## 2014-03-05 DIAGNOSIS — Z515 Encounter for palliative care: Secondary | ICD-10-CM

## 2014-03-05 DIAGNOSIS — F329 Major depressive disorder, single episode, unspecified: Secondary | ICD-10-CM | POA: Diagnosis present

## 2014-03-05 DIAGNOSIS — Z720 Tobacco use: Secondary | ICD-10-CM | POA: Diagnosis not present

## 2014-03-05 DIAGNOSIS — K219 Gastro-esophageal reflux disease without esophagitis: Secondary | ICD-10-CM | POA: Diagnosis present

## 2014-03-05 DIAGNOSIS — E780 Pure hypercholesterolemia: Secondary | ICD-10-CM | POA: Diagnosis present

## 2014-03-05 DIAGNOSIS — I5032 Chronic diastolic (congestive) heart failure: Secondary | ICD-10-CM

## 2014-03-05 DIAGNOSIS — N184 Chronic kidney disease, stage 4 (severe): Secondary | ICD-10-CM | POA: Diagnosis present

## 2014-03-05 DIAGNOSIS — N189 Chronic kidney disease, unspecified: Secondary | ICD-10-CM

## 2014-03-05 DIAGNOSIS — N179 Acute kidney failure, unspecified: Secondary | ICD-10-CM

## 2014-03-05 LAB — CBC
HEMATOCRIT: 34.5 % — AB (ref 36.0–46.0)
HEMOGLOBIN: 11 g/dL — AB (ref 12.0–15.0)
MCH: 32 pg (ref 26.0–34.0)
MCHC: 31.9 g/dL (ref 30.0–36.0)
MCV: 100.3 fL — AB (ref 78.0–100.0)
Platelets: 202 10*3/uL (ref 150–400)
RBC: 3.44 MIL/uL — ABNORMAL LOW (ref 3.87–5.11)
RDW: 17.6 % — ABNORMAL HIGH (ref 11.5–15.5)
WBC: 18.7 10*3/uL — ABNORMAL HIGH (ref 4.0–10.5)

## 2014-03-05 LAB — HEMOGLOBIN A1C
HEMOGLOBIN A1C: 6 % — AB (ref <5.7)
MEAN PLASMA GLUCOSE: 126 mg/dL — AB (ref <117)

## 2014-03-05 LAB — HEPATIC FUNCTION PANEL
ALT: 20 U/L (ref 0–35)
AST: 17 U/L (ref 0–37)
Albumin: 3.7 g/dL (ref 3.5–5.2)
Alkaline Phosphatase: 87 U/L (ref 39–117)
Total Bilirubin: 0.3 mg/dL (ref 0.3–1.2)
Total Protein: 7.1 g/dL (ref 6.0–8.3)

## 2014-03-05 LAB — CREATININE, SERUM
Creatinine, Ser: 2.03 mg/dL — ABNORMAL HIGH (ref 0.50–1.10)
GFR calc Af Amer: 27 mL/min — ABNORMAL LOW (ref >90)
GFR, EST NON AFRICAN AMERICAN: 23 mL/min — AB (ref >90)

## 2014-03-05 LAB — COMPREHENSIVE METABOLIC PANEL
ALT: 21 U/L (ref 0–35)
AST: 19 U/L (ref 0–37)
Albumin: 3.5 g/dL (ref 3.5–5.2)
Alkaline Phosphatase: 87 U/L (ref 39–117)
Anion gap: 18 — ABNORMAL HIGH (ref 5–15)
BUN: 35 mg/dL — ABNORMAL HIGH (ref 6–23)
CALCIUM: 9.3 mg/dL (ref 8.4–10.5)
CO2: 29 mEq/L (ref 19–32)
Chloride: 91 mEq/L — ABNORMAL LOW (ref 96–112)
Creatinine, Ser: 1.86 mg/dL — ABNORMAL HIGH (ref 0.50–1.10)
GFR calc non Af Amer: 26 mL/min — ABNORMAL LOW (ref >90)
GFR, EST AFRICAN AMERICAN: 30 mL/min — AB (ref >90)
Glucose, Bld: 158 mg/dL — ABNORMAL HIGH (ref 70–99)
Potassium: 3.6 mEq/L — ABNORMAL LOW (ref 3.7–5.3)
SODIUM: 138 meq/L (ref 137–147)
TOTAL PROTEIN: 7 g/dL (ref 6.0–8.3)
Total Bilirubin: 0.3 mg/dL (ref 0.3–1.2)

## 2014-03-05 LAB — I-STAT TROPONIN, ED: Troponin i, poc: 0.09 ng/mL (ref 0.00–0.08)

## 2014-03-05 LAB — CBC WITH DIFFERENTIAL/PLATELET
BASOS PCT: 0 % (ref 0–1)
Basophils Absolute: 0 10*3/uL (ref 0.0–0.1)
EOS ABS: 0 10*3/uL (ref 0.0–0.7)
Eosinophils Relative: 0 % (ref 0–5)
HCT: 34 % — ABNORMAL LOW (ref 36.0–46.0)
Hemoglobin: 10.8 g/dL — ABNORMAL LOW (ref 12.0–15.0)
Lymphocytes Relative: 1 % — ABNORMAL LOW (ref 12–46)
Lymphs Abs: 0.3 10*3/uL — ABNORMAL LOW (ref 0.7–4.0)
MCH: 31.9 pg (ref 26.0–34.0)
MCHC: 31.8 g/dL (ref 30.0–36.0)
MCV: 100.3 fL — ABNORMAL HIGH (ref 78.0–100.0)
Monocytes Absolute: 0.5 10*3/uL (ref 0.1–1.0)
Monocytes Relative: 3 % (ref 3–12)
Neutro Abs: 20.4 10*3/uL — ABNORMAL HIGH (ref 1.7–7.7)
Neutrophils Relative %: 96 % — ABNORMAL HIGH (ref 43–77)
PLATELETS: 206 10*3/uL (ref 150–400)
RBC: 3.39 MIL/uL — ABNORMAL LOW (ref 3.87–5.11)
RDW: 17.7 % — AB (ref 11.5–15.5)
WBC: 21.2 10*3/uL — AB (ref 4.0–10.5)

## 2014-03-05 LAB — TROPONIN I
Troponin I: <0.3 ng/mL (ref <0.30)
Troponin I: <0.3 ng/mL (ref <0.30)

## 2014-03-05 LAB — PRO B NATRIURETIC PEPTIDE: Pro B Natriuretic peptide (BNP): 2128 pg/mL — ABNORMAL HIGH (ref 0–125)

## 2014-03-05 LAB — D-DIMER, QUANTITATIVE (NOT AT ARMC): D DIMER QUANT: 1.79 ug{FEU}/mL — AB (ref 0.00–0.48)

## 2014-03-05 MED ORDER — POLYETHYLENE GLYCOL 3350 17 G PO PACK
34.0000 g | PACK | Freq: Every day | ORAL | Status: DC | PRN
Start: 2014-03-05 — End: 2014-03-10
  Filled 2014-03-05: qty 2

## 2014-03-05 MED ORDER — NICOTINE 14 MG/24HR TD PT24
14.0000 mg | MEDICATED_PATCH | Freq: Every day | TRANSDERMAL | Status: DC
  Administered 2014-03-06 – 2014-03-09 (×4): 14 mg via TRANSDERMAL
  Filled 2014-03-05 (×6): qty 1

## 2014-03-05 MED ORDER — DEXTROSE 5 % IV SOLN
1.0000 g | INTRAVENOUS | Status: DC
  Administered 2014-03-05 – 2014-03-09 (×5): 1 g via INTRAVENOUS
  Filled 2014-03-05 (×6): qty 10

## 2014-03-05 MED ORDER — DILTIAZEM HCL ER 240 MG PO CP24: 240.0000 mg | ORAL_CAPSULE | Freq: Every day | ORAL | Status: DC

## 2014-03-05 MED ORDER — FUROSEMIDE 80 MG PO TABS: 80.0000 mg | ORAL_TABLET | Freq: Two times a day (BID) | ORAL | Status: DC

## 2014-03-05 MED ORDER — SENNA 8.6 MG PO TABS: 2.0000 | ORAL_TABLET | Freq: Every day | ORAL | Status: DC | PRN

## 2014-03-05 MED ORDER — SODIUM CHLORIDE 0.9 % IJ SOLN
3.0000 mL | Freq: Two times a day (BID) | INTRAMUSCULAR | Status: DC
  Administered 2014-03-05 – 2014-03-09 (×8): 3 mL via INTRAVENOUS

## 2014-03-05 MED ORDER — ONDANSETRON HCL 4 MG PO TABS
4.0000 mg | ORAL_TABLET | Freq: Four times a day (QID) | ORAL | Status: DC | PRN
Start: 2014-03-05 — End: 2014-03-10

## 2014-03-05 MED ORDER — PANTOPRAZOLE SODIUM 40 MG PO TBEC
40.0000 mg | DELAYED_RELEASE_TABLET | Freq: Every day | ORAL | Status: DC
  Administered 2014-03-05 – 2014-03-09 (×5): 40 mg via ORAL
  Filled 2014-03-05 (×4): qty 1

## 2014-03-05 MED ORDER — ONDANSETRON HCL 4 MG/2ML IJ SOLN: 4.0000 mg | Freq: Four times a day (QID) | INTRAMUSCULAR | Status: DC | PRN

## 2014-03-05 MED ORDER — TIOTROPIUM BROMIDE MONOHYDRATE 18 MCG IN CAPS
18.0000 ug | ORAL_CAPSULE | Freq: Every day | RESPIRATORY_TRACT | Status: DC
  Administered 2014-03-05 – 2014-03-10 (×6): 18 ug via RESPIRATORY_TRACT
  Filled 2014-03-05 (×2): qty 5

## 2014-03-05 MED ORDER — BUDESONIDE 0.5 MG/2ML IN SUSP
0.5000 mg | Freq: Two times a day (BID) | RESPIRATORY_TRACT | Status: DC | PRN
  Filled 2014-03-05: qty 2

## 2014-03-05 MED ORDER — ASPIRIN EC 81 MG PO TBEC
81.0000 mg | DELAYED_RELEASE_TABLET | Freq: Every day | ORAL | Status: DC
  Administered 2014-03-05 – 2014-03-09 (×5): 81 mg via ORAL
  Filled 2014-03-05 (×6): qty 1

## 2014-03-05 MED ORDER — ATORVASTATIN CALCIUM 10 MG PO TABS
10.0000 mg | ORAL_TABLET | Freq: Every day | ORAL | Status: DC
  Administered 2014-03-05 – 2014-03-09 (×5): 10 mg via ORAL
  Filled 2014-03-05 (×7): qty 1

## 2014-03-05 MED ORDER — DEXTROSE 5 % IV SOLN
500.0000 mg | INTRAVENOUS | Status: DC
  Administered 2014-03-05 – 2014-03-07 (×3): 500 mg via INTRAVENOUS
  Filled 2014-03-05 (×4): qty 500

## 2014-03-05 MED ORDER — FUROSEMIDE 80 MG PO TABS
80.0000 mg | ORAL_TABLET | Freq: Every day | ORAL | Status: DC
  Administered 2014-03-05 – 2014-03-06 (×2): 80 mg via ORAL
  Filled 2014-03-05 (×3): qty 1

## 2014-03-05 MED ORDER — METHYLPREDNISOLONE SODIUM SUCC 125 MG IJ SOLR
125.0000 mg | Freq: Once | INTRAMUSCULAR | Status: AC
  Administered 2014-03-05: 125 mg via INTRAVENOUS
  Filled 2014-03-05: qty 2

## 2014-03-05 MED ORDER — TRAMADOL HCL 50 MG PO TABS
50.0000 mg | ORAL_TABLET | Freq: Two times a day (BID) | ORAL | Status: DC | PRN
  Administered 2014-03-08 – 2014-03-10 (×2): 50 mg via ORAL
  Filled 2014-03-05 (×2): qty 1

## 2014-03-05 MED ORDER — IPRATROPIUM-ALBUTEROL 0.5-2.5 (3) MG/3ML IN SOLN
3.0000 mL | Freq: Once | RESPIRATORY_TRACT | Status: AC
  Administered 2014-03-05: 3 mL via RESPIRATORY_TRACT
  Filled 2014-03-05: qty 3

## 2014-03-05 MED ORDER — TECHNETIUM TC 99M DIETHYLENETRIAME-PENTAACETIC ACID: 40.0000 | Freq: Once | INTRAVENOUS | Status: AC | PRN

## 2014-03-05 MED ORDER — TRAMADOL HCL 50 MG PO TABS: 50.0000 mg | ORAL_TABLET | Freq: Four times a day (QID) | ORAL | Status: DC | PRN

## 2014-03-05 MED ORDER — CLOPIDOGREL BISULFATE 75 MG PO TABS
75.0000 mg | ORAL_TABLET | Freq: Every day | ORAL | Status: DC
  Administered 2014-03-05 – 2014-03-09 (×5): 75 mg via ORAL
  Filled 2014-03-05 (×6): qty 1

## 2014-03-05 MED ORDER — VENLAFAXINE HCL ER 75 MG PO CP24
75.0000 mg | ORAL_CAPSULE | Freq: Every day | ORAL | Status: DC
  Administered 2014-03-06 – 2014-03-09 (×4): 75 mg via ORAL
  Filled 2014-03-05 (×5): qty 1

## 2014-03-05 MED ORDER — SODIUM CHLORIDE 0.9 % IJ SOLN
3.0000 mL | Freq: Two times a day (BID) | INTRAMUSCULAR | Status: DC
  Administered 2014-03-06 – 2014-03-09 (×4): 3 mL via INTRAVENOUS

## 2014-03-05 MED ORDER — ARFORMOTEROL TARTRATE 15 MCG/2ML IN NEBU
15.0000 ug | INHALATION_SOLUTION | Freq: Every day | RESPIRATORY_TRACT | Status: DC
  Administered 2014-03-05 – 2014-03-10 (×5): 15 ug via RESPIRATORY_TRACT
  Filled 2014-03-05 (×7): qty 2

## 2014-03-05 MED ORDER — DILTIAZEM HCL ER COATED BEADS 240 MG PO CP24
240.0000 mg | ORAL_CAPSULE | Freq: Every day | ORAL | Status: DC
  Administered 2014-03-06 – 2014-03-09 (×4): 240 mg via ORAL
  Filled 2014-03-05 (×6): qty 1

## 2014-03-05 MED ORDER — ACETAMINOPHEN 650 MG RE SUPP: 650.0000 mg | Freq: Four times a day (QID) | RECTAL | Status: DC | PRN

## 2014-03-05 MED ORDER — FUROSEMIDE 80 MG PO TABS
160.0000 mg | ORAL_TABLET | Freq: Every day | ORAL | Status: DC
  Administered 2014-03-06 – 2014-03-07 (×2): 160 mg via ORAL
  Filled 2014-03-05 (×4): qty 2

## 2014-03-05 MED ORDER — ENOXAPARIN SODIUM 40 MG/0.4ML ~~LOC~~ SOLN
40.0000 mg | SUBCUTANEOUS | Status: DC
  Filled 2014-03-05: qty 0.4

## 2014-03-05 MED ORDER — METHOCARBAMOL 500 MG PO TABS
500.0000 mg | ORAL_TABLET | Freq: Two times a day (BID) | ORAL | Status: DC | PRN
  Administered 2014-03-09: 500 mg via ORAL
  Filled 2014-03-05 (×2): qty 1

## 2014-03-05 MED ORDER — FERROUS SULFATE 325 (65 FE) MG PO TABS
325.0000 mg | ORAL_TABLET | Freq: Every day | ORAL | Status: DC
Start: 2014-03-05 — End: 2014-03-10
  Administered 2014-03-05 – 2014-03-10 (×6): 325 mg via ORAL
  Filled 2014-03-05 (×7): qty 1

## 2014-03-05 MED ORDER — SODIUM CHLORIDE 0.9 % IJ SOLN: 3.0000 mL | INTRAMUSCULAR | Status: DC | PRN

## 2014-03-05 MED ORDER — ACETAMINOPHEN 325 MG PO TABS
650.0000 mg | ORAL_TABLET | Freq: Four times a day (QID) | ORAL | Status: DC | PRN
  Administered 2014-03-06 – 2014-03-08 (×2): 650 mg via ORAL
  Filled 2014-03-05 (×2): qty 2

## 2014-03-05 MED ORDER — SODIUM CHLORIDE 0.9 % IV SOLN: 250.0000 mL | INTRAVENOUS | Status: DC | PRN

## 2014-03-05 MED ORDER — METHYLPREDNISOLONE SODIUM SUCC 125 MG IJ SOLR
60.0000 mg | Freq: Two times a day (BID) | INTRAMUSCULAR | Status: DC
  Administered 2014-03-05 – 2014-03-07 (×4): 60 mg via INTRAVENOUS
  Filled 2014-03-05 (×6): qty 0.96

## 2014-03-05 MED ORDER — LEVALBUTEROL HCL 0.63 MG/3ML IN NEBU
0.6300 mg | INHALATION_SOLUTION | Freq: Four times a day (QID) | RESPIRATORY_TRACT | Status: DC | PRN
  Administered 2014-03-06: 0.63 mg via RESPIRATORY_TRACT
  Filled 2014-03-05: qty 3

## 2014-03-05 MED ORDER — NITROGLYCERIN 0.4 MG SL SUBL: 0.4000 mg | SUBLINGUAL_TABLET | SUBLINGUAL | Status: DC | PRN

## 2014-03-05 MED ORDER — ENOXAPARIN SODIUM 30 MG/0.3ML ~~LOC~~ SOLN
30.0000 mg | SUBCUTANEOUS | Status: DC
  Administered 2014-03-05 – 2014-03-09 (×5): 30 mg via SUBCUTANEOUS
  Filled 2014-03-05 (×6): qty 0.3

## 2014-03-05 MED ORDER — LUBIPROSTONE 24 MCG PO CAPS
24.0000 ug | ORAL_CAPSULE | Freq: Two times a day (BID) | ORAL | Status: DC
  Administered 2014-03-05 – 2014-03-10 (×10): 24 ug via ORAL
  Filled 2014-03-05 (×13): qty 1

## 2014-03-05 MED ORDER — ALLOPURINOL 100 MG PO TABS
100.0000 mg | ORAL_TABLET | Freq: Two times a day (BID) | ORAL | Status: DC
  Administered 2014-03-05 – 2014-03-09 (×9): 100 mg via ORAL
  Filled 2014-03-05 (×11): qty 1

## 2014-03-05 MED ORDER — IPRATROPIUM-ALBUTEROL 0.5-2.5 (3) MG/3ML IN SOLN
3.0000 mL | Freq: Four times a day (QID) | RESPIRATORY_TRACT | Status: DC
  Administered 2014-03-05 – 2014-03-09 (×14): 3 mL via RESPIRATORY_TRACT
  Filled 2014-03-05 (×14): qty 3

## 2014-03-05 MED ORDER — TECHNETIUM TO 99M ALBUMIN AGGREGATED
6.0000 | Freq: Once | INTRAVENOUS | Status: AC | PRN
  Administered 2014-03-05: 6 via INTRAVENOUS

## 2014-03-05 NOTE — ED Notes (Signed)
NOTIFIED DR. YAO OF PATIENTS LAB RESULTS 0.09 ng/ml ,@13 :15 pm ,03/05/2014.

## 2014-03-05 NOTE — H&P (Signed)
[facility] History and Physical  Wanda Yang OHY:[id number] DOB: [birth year] DOA: 03/05/2014  Referring physician:  PCP: Stephens Shire, MD   Chief Complaint: Shortness of breath  HPI:  73 y.o. female With history of peripheral vascular disease, coronary artery disease, chronic kidney disease, tobacco abuse with COPD and chronic hypoxic respiratory failure, on 3 L of oxygen at home, presents to the ED with a fall, worsening dyspnea on exertion. Symptoms started 3 days ago. She complains of dyspnea with minimal activity and exertion. Denies any chest pain but complains of "irregular heartbeat"on and off. She denies fever. She has had a nonproductive cough and was recently started on prednisone and Mucinex and azithromycin by her PCP. Currently she's taking prednisone 30 mg a day for suspected temporal arteritis.WBC 21, but CXR showed no obvious pneumonia. This is likely from steroid use. Trop mildly elevated, likely demand ischemia from hypoxia  She has multiple bruises on her arms and legs and states that she had a fall 2 days ago. She has chronic back pain and her legs give out on her. She was supposed to have an MRI of the back tomorrow at [place].       Review of Systems: negative for the following  Constitutional: Denies fever, chills, diaphoresis, appetite change and fatigue.  HEENT: Denies photophobia, eye pain, redness, hearing loss, ear pain, congestion, sore throat, rhinorrhea, sneezing, mouth sores, trouble swallowing, neck pain, neck stiffness and tinnitus.  Respiratory: Positive for cough and shortness of breath Cardiovascular: Denies chest pain, palpitations and leg swelling.  Gastrointestinal: Denies nausea, vomiting, abdominal pain, diarrhea, constipation, blood in stool and abdominal distention.  Genitourinary: Denies dysuria, urgency, frequency, hematuria, flank pain and difficulty urinating.  Musculoskeletal: Denies myalgias, back pain, joint  swelling, arthralgias and gait problem.  Skin: Denies pallor, rash and wound.  Neurological: Denies dizziness, seizures, syncope, weakness, light-headedness, numbness and headaches.  Hematological: Denies adenopathy. Easy bruising, personal or family bleeding history  Psychiatric/Behavioral: Denies suicidal ideation, mood changes, confusion, nervousness, sleep disturbance and agitation       Past Medical History  Diagnosis Date  . CAD (coronary artery disease)   . Atherosclerosis of abdominal aorta   . On home oxygen therapy     "3L; 24/7" (10/03/2013)  . Tobacco abuse   . HTN (hypertension)   . COPD (chronic obstructive pulmonary disease)   . PVD (peripheral vascular disease)   . Anxiety   . GERD (gastroesophageal reflux disease)   . RBBB (right bundle branch block)   . Tachycardia   . High cholesterol   . Leukocytosis   . CHF (congestive heart failure)   . Shortness of breath   . UTI (urinary tract infection)   . CKD (chronic kidney disease), stage IV   . Temporal arteritis dx'd 08/2013  . Endometrial carcinoma   . Uterine cancer   . Myocardial infarction ~ 2011    " MILD "  . Anemia   . History of blood transfusion     "S/P anemia for 3 years"  . Daily headache     "last few days" (10/03/2013)  . Arthritis     "hands and fingers" (10/03/2013)  . Chronic lower back pain   . Depression     "sometimes; not often" (10/03/2013)  . Lower GI bleeding 02/2012; 10/03/2013     Past Surgical History  Procedure Laterality Date  . Carotid stent    . Tonsillectomy    . Forearm fracture surgery Left ~ 2005  PLATES AND SCREWS  . Femoral artery stent    . Reduction mammaplasty    . Wrist fracture surgery Left ~ 2005    "it was crushed; they glued it together"  . Coronary angioplasty with stent placement      "1 + +1"  . Iliac artery stent Bilateral   . Esophagogastroduodenoscopy N/A 10/04/2013    Procedure: ESOPHAGOGASTRODUODENOSCOPY (EGD);  Surgeon: Beryle Beams, MD;   [facility] END[facility]COPY;  Service: Endoscopy;  Laterality: N/A;  . Vaginal hysterectomy      for cancer      Social History:  reports that she has been smoking Cigarettes.  She has a 28 pack-year smoking history. She has never used smokeless tobacco. She reports that she does not drink alcohol or use illicit drugs.    Allergies  Allergen Reactions  . Uloric [Febuxostat] Other (See Comments)    Unknown reaction  . Zolpidem Tartrate Other (See Comments)    hallucinations  . Ciprofloxacin Anxiety and Other (See Comments)    Shakiness  . Contrast Media [Iodinated Diagnostic Agents] Rash  . Tetracycline Rash  . Vicodin [Hydrocodone-Acetaminophen] Anxiety    Family History  Problem Relation Age of Onset  . Heart attack Father   . Coronary artery disease Father   . Leukemia Mother   . Leukemia Brother      Prior to Admission medications   Medication Sig Start Date End Date Taking? Authorizing Provider  albuterol (PROAIR HFA) 108 (90 BASE) MCG/ACT inhaler Inhale 2 puffs into the lungs every 4 (four) hours as needed for wheezing or shortness of breath.   Yes Historical Provider, MD  allopurinol (ZYLOPRIM) 100 MG tablet Take 100 mg by mouth 2 (two) times daily.     Yes Historical Provider, MD  arformoterol (BROVANA) 15 MCG/2ML NEBU Take 15 mcg by nebulization daily.   Yes Historical Provider, MD  aspirin 81 MG tablet Take 1 tablet (81 mg total) by mouth daily. 10/17/13  Yes Charlynne Cousins, MD  budesonide (PULMICORT) 0.5 MG/2ML nebulizer solution Take 0.5 mg by nebulization 2 (two) times daily as needed (shortness of breath).   Yes Historical Provider, MD  calcitRIOL (ROCALTROL) 0.25 MCG capsule Take 0.25 mcg by mouth daily.   Yes Historical Provider, MD  clopidogrel (PLAVIX) 75 MG tablet Take 1 tablet (75 mg total) by mouth daily. 10/17/13  Yes Charlynne Cousins, MD  diltiazem (DILACOR XR) 240 MG 24 hr capsule Take 1 capsule (240 mg total) by mouth daily. 07/15/13  Yes Thayer Headings, MD  famotidine (PEPCID) 20 MG tablet Take 20 mg by mouth 2 (two) times daily as needed for heartburn.    Yes Historical Provider, MD  ferrous sulfate 325 (65 FE) MG tablet Take 1 tablet (325 mg total) by mouth daily with breakfast. 10/05/13  Yes Charlynne Cousins, MD  furosemide (LASIX) 80 MG tablet Take 80-160 mg by mouth 2 (two) times daily. 160mg  in the morning and 80mg  in the evening   Yes Historical Provider, MD  lactose free nutrition (BOOST PLUS) LIQD Take 237 mLs by mouth 2 (two) times daily between meals. 02/25/14  Yes Geradine Girt, DO  lubiprostone (AMITIZA) 24 MCG capsule Take 24 mcg by mouth 2 (two) times daily with a meal.   Yes Historical Provider, MD  methocarbamol (ROBAXIN) 500 MG tablet Take 1 tablet (500 mg total) by mouth 2 (two) times daily as needed for muscle spasms. 02/10/14  Yes Viona Gilmore Cartner, PA-C  nicotine (NICODERM  CQ - DOSED IN MG/24 HOURS) 14 mg/24hr patch Place 1 patch (14 mg total) onto the skin daily. 02/25/14  Yes Geradine Girt, DO  nitroGLYCERIN (NITROSTAT) 0.4 MG SL tablet Place 0.4 mg under the tongue every 5 (five) minutes as needed for chest pain.   Yes Historical Provider, MD  pantoprazole (PROTONIX) 40 MG tablet Take 40 mg by mouth daily.   Yes Historical Provider, MD  polyethylene glycol (MIRALAX / GLYCOLAX) packet Take 34 g by mouth daily as needed for mild constipation.   Yes Historical Provider, MD  predniSONE (DELTASONE) 20 MG tablet Take 20-40 mg by mouth daily with breakfast. 40 mg x 5 days, 30 mg x 5 days then 20 mg x 5 days   Yes Historical Provider, MD  Probiotic Product (ALIGN PO) Take 1 tablet by mouth daily.    Yes Historical Provider, MD  rosuvastatin (CRESTOR) 20 MG tablet Take 1 tablet (20 mg total) by mouth daily. 07/15/13  Yes Thayer Headings, MD  senna (SENOKOT) 8.6 MG TABS tablet Take 2 tablets (17.2 mg total) by mouth daily as needed for mild constipation. 02/25/14  Yes Geradine Girt, DO  sodium phosphate (FLEET) 7-19 GM/118ML ENEM  Place 133 mLs (1 enema total) rectally daily as needed for severe constipation. 02/20/14  Yes Merryl Hacker, MD  tiotropium (SPIRIVA) 18 MCG inhalation capsule Place 18 mcg into inhaler and inhale daily.   Yes Historical Provider, MD  traMADol (ULTRAM) 50 MG tablet Take 50 mg by mouth every 6 (six) hours as needed for moderate pain.    Yes Historical Provider, MD  venlafaxine XR (EFFEXOR-XR) 75 MG 24 hr capsule Take 75 mg by mouth daily.    Yes Historical Provider, MD  vitamin B-12 (CYANOCOBALAMIN) 1000 MCG tablet Take 1,000 mcg by mouth daily.   Yes Historical Provider, MD     Physical Exam: Filed Vitals:   03/05/14 1152 03/05/14 1234 03/05/14 1400 03/05/14 1526  BP: 151/80 158/74 158/81 150/71  Pulse: 117 98 98 96  Temp: 98.1 F (36.7 C)   98 F (36.7 C)  TempSrc: Oral   Oral  Resp: 21 19 19 18   Height:    5\' 8"  (1.727 m)  Weight:    66.9 kg (147 lb 7.8 oz)  SpO2: 90% 92% 92% 91%     Constitutional: Vital signs reviewed. Patient is a well-developed and well-nourished in no acute distress and cooperative with exam. Alert and oriented x3.  Head: Normocephalic and atraumatic  Ear: TM normal bilaterally  Mouth: no erythema or exudates, MMM  Eyes: PERRL, EOMI, conjunctivae normal, No scleral icterus.  Neck: Supple, Trachea midline normal ROM, No JVD, mass, thyromegaly, or carotid bruit present.  Cardiovascular: RRR, S1 normal, S2 normal, no MRG, pulses symmetric and intact bilaterally  Pulmonary/Chest: Diffuse bilateral wheezes, rales, or rhonchi  Abdominal: Soft. Non-tender, non-distended, bowel sounds are normal, no masses, organomegaly, or guarding present.  GU: no CVA tenderness Musculoskeletal: No joint deformities, erythema, or stiffness, ROM full and no nontender Ext: no edema and no cyanosis, pulses palpable bilaterally (DP and PT)  Hematology: no cervical, inginal, or axillary adenopathy.  Neurological: A&O x3, Strenght is normal and symmetric bilaterally, cranial nerve  II-XII are grossly intact, no focal motor deficit, sensory intact to light touch bilaterally.  Skin: Warm, dry and intact. No rash, cyanosis, or clubbing.  Psychiatric: Normal mood and affect. speech and behavior is normal. Judgment and thought content normal. Cognition and memory are normal.  Labs on Admission:    Basic Metabolic Panel:  Recent Labs Lab 03/05/14 1223 03/05/14 1443  NA 138  --   K 3.6*  --   CL 91*  --   CO2 29  --   GLUCOSE 158*  --   BUN 35*  --   CREATININE 1.86* 2.03*  CALCIUM 9.3  --    Liver Function Tests:  Recent Labs Lab 03/05/14 1223 03/05/14 1443  AST 19 17  ALT 21 20  ALKPHOS 87 87  BILITOT 0.3 0.3  PROT 7.0 7.1  ALBUMIN 3.5 3.7   No results found for this basename: LIPASE, AMYLASE,  in the last 168 hours No results found for this basename: AMMONIA,  in the last 168 hours CBC:  Recent Labs Lab 03/05/14 1223 03/05/14 1443  WBC 21.2* 18.7*  NEUTROABS 20.4*  --   HGB 10.8* 11.0*  HCT 34.0* 34.5*  MCV 100.3* 100.3*  PLT 206 202   Cardiac Enzymes:  Recent Labs Lab 03/05/14 1443  TROPONINI <0.30    BNP (last 3 results)  Recent Labs  10/03/13 1143 03/05/14 1223  PROBNP 3256.0* 2128.0*      CBG: No results found for this basename: GLUCAP,  in the last 168 hours  Radiological Exams on Admission: Dg Chest 2 View  03/05/2014   CLINICAL DATA:  Shortness of breath, history COPD, coronary artery disease, smoking, hypertension, endometrial cancer  EXAM: CHEST  2 VIEW  COMPARISON:  02/22/2014  FINDINGS: Lordotic positioning.  Normal heart size, mediastinal contours and pulmonary vascularity.  Emphysematous and bronchitic changes consistent with COPD.  Bibasilar atelectasis versus scarring.  No acute infiltrate, pleural effusion, or pneumothorax.  Bones unremarkable.  IMPRESSION: COPD changes with bibasilar atelectasis versus scarring.  No acute abnormalities.   Electronically Signed   By: Lavonia Dana M.D.   On: 03/05/2014  13:13    EKG: Independently reviewed. EKG Interpretation  Date/Time: Wednesday March 05 2014 11:52:31 EDT  Ventricular Rate: 101  PR Interval: 135  QRS Duration: 132  QT Interval: 415  QTC Calculation: 538  R Axis: 145  Text Interpretation: Sinus tachycardia Ventricular premature complex  Right bundle branch block TWI V4-V6 new since previous    Assessment/Plan Active Problems:   COPD (chronic obstructive pulmonary disease)   COPD with acute exacerbation  Shortness of breath Combination of acute COPD/CHF exacerbation We'll start the patient on DuoNeb treatments IV Solu-Medrol, Mucinex, Empiric antibiotics Home oxygen dependent on 3 L, Currently oxygen requirement at baseline D-dimer elevated, we'll check a VQ scan and a Doppler to rule out PE and DVT  Weakness , falls questionable lumbar spinal stenosis The patient will need an MRI of the lumbar spine  Anemia of chronic disease Continue Protonix - EGD on 5.1.2015 showed duodenal diverticuli.  Hemoglobin stable.  Currently on plavix and ASA    Acute on chronic Diastolic Heart failure;  2-D ECHO 5/15 shows EF of 60-65% - Continue with lasix.  Continue by mouth Lasix   CKD stage IV;  - cr better than before. Last cr per our records was at 3. 0. Cr , fluctuates between 1.6-2 Continue Lasix  CAD;  Continue aspirin Plavix Cycle cardiac enzymes Monitor patient on telemetry        Code Status:   full Family Communication: bedside Disposition Plan: admit   Time spent: 70 mins   [facility] Pager [phone]  If 7PM-7AM, please contact night-coverage [email or web address] Password [facility] 03/05/2014, 5:32 PM

## 2014-03-05 NOTE — ED Provider Notes (Signed)
Medical screening examination/treatment/procedure(s) were performed by non-physician practitioner and as supervising physician I was immediately available for consultation/collaboration.   EKG Interpretation None       Babette Relic, MD 03/05/14 1331

## 2014-03-05 NOTE — Progress Notes (Signed)
Pharmacy may adjust Lovenox dose for VTE prophylaxis Indication: VTE prophylaxis  Allergies  Allergen Reactions  . Uloric [Febuxostat] Other (See Comments)    Unknown reaction  . Zolpidem Tartrate Other (See Comments)    hallucinations  . Ciprofloxacin Anxiety and Other (See Comments)    Shakiness  . Contrast Media [Iodinated Diagnostic Agents] Rash  . Tetracycline Rash  . Vicodin [Hydrocodone-Acetaminophen] Anxiety    Patient Measurements: Height: 5\' 8"  (172.7 cm) Weight: 147 lb 7.8 oz (66.9 kg) IBW/kg (Calculated) : 63.9   Labs:  Recent Labs  03/05/14 1223 03/05/14 1443  HGB 10.8* 11.0*  HCT 34.0* 34.5*  PLT 206 202  CREATININE 1.86* 2.03*  TROPONINI  --  <0.30    Estimated Creatinine Clearance: 25.3 ml/min (by C-G formula based on Cr of 2.03).   Medical History: Past Medical History  Diagnosis Date  . CAD (coronary artery disease)   . Atherosclerosis of abdominal aorta   . On home oxygen therapy     "3L; 24/7" (10/03/2013)  . Tobacco abuse   . HTN (hypertension)   . COPD (chronic obstructive pulmonary disease)   . PVD (peripheral vascular disease)   . Anxiety   . GERD (gastroesophageal reflux disease)   . RBBB (right bundle branch block)   . Tachycardia   . High cholesterol   . Leukocytosis   . CHF (congestive heart failure)   . Shortness of breath   . UTI (urinary tract infection)   . CKD (chronic kidney disease), stage IV   . Temporal arteritis dx'd 08/2013  . Endometrial carcinoma   . Uterine cancer   . Myocardial infarction ~ 2011    " MILD "  . Anemia   . History of blood transfusion     "S/P anemia for 3 years"  . Daily headache     "last few days" (10/03/2013)  . Arthritis     "hands and fingers" (10/03/2013)  . Chronic lower back pain   . Depression     "sometimes; not often" (10/03/2013)  . Lower GI bleeding 02/2012; 10/03/2013    Assessment: Lovenox 40mg  SQ q24h ordered for VTE prophylaxis for this 73 y.o female. Orders indicate  pharmacy may adjust dose.  Patient has h/o CKD and today the SCr is 2.03. Her estimate CrCl is 25 ml/min.  Weight = 66.9 kg. H/o anemia. Pltc 202, H/H 11.0/34.5.   H/o lower GI bleeding 02/2012, 10/03/13 noted.  MD notes the patient has multiple bruises on her arms and legs and states that she had a fall 2 days ago. No active bleeding noted.    Plan:  Adjust lovenox dose down to 30mg  SQ q24h for VTE prophylaxis in patient with CrCL < 30 ml/min. Pharmacy will sign off.   Nicole Cella, RPh Clinical Pharmacist Pager: [phone] 03/05/2014,9:25 PM

## 2014-03-05 NOTE — ED Notes (Signed)
Xray contacted to make aware that pt is ready.

## 2014-03-05 NOTE — ED Provider Notes (Signed)
CSN: [id number]     Arrival date & time 03/05/14  1136 History   First MD Initiated Contact with Patient 03/05/14 1137     Chief Complaint  Patient presents with  . Shortness of Breath     (Consider location/radiation/quality/duration/timing/severity/associated sxs/prior Treatment) The history is provided by the patient.  Wanda Yang is a 73 y.o. female hx of COPD on home O2, CHF, CKD, here with shortness of breath. Recently admitted for colonic obstruction that resolved. Also diagnosed with temporal arteritis in the hospital and currently on prednisone 30 mg daily. Has been short of breath for the last week. Tries to cough but unable to cough up anything. No fevers. Tried albuterol with minimal relief. No vomiting or abdominal pain. She noticed that her O2 was 80-85% on 3L [place] at home.    Past Medical History  Diagnosis Date  . CAD (coronary artery disease)   . Atherosclerosis of abdominal aorta   . On home oxygen therapy     "3L; 24/7" (10/03/2013)  . Tobacco abuse   . HTN (hypertension)   . COPD (chronic obstructive pulmonary disease)   . PVD (peripheral vascular disease)   . Anxiety   . GERD (gastroesophageal reflux disease)   . RBBB (right bundle branch block)   . Tachycardia   . High cholesterol   . Leukocytosis   . CHF (congestive heart failure)   . Shortness of breath   . UTI (urinary tract infection)   . CKD (chronic kidney disease), stage IV   . Temporal arteritis dx'd 08/2013  . Endometrial carcinoma   . Uterine cancer   . Myocardial infarction ~ 2011    " MILD "  . Anemia   . History of blood transfusion     "S/P anemia for 3 years"  . Daily headache     "last few days" (10/03/2013)  . Arthritis     "hands and fingers" (10/03/2013)  . Chronic lower back pain   . Depression     "sometimes; not often" (10/03/2013)  . Lower GI bleeding 02/2012; 10/03/2013   Past Surgical History  Procedure Laterality Date  . Carotid stent    . Tonsillectomy    . Forearm  fracture surgery Left ~ 2005    PLATES AND SCREWS  . Femoral artery stent    . Reduction mammaplasty    . Wrist fracture surgery Left ~ 2005    "it was crushed; they glued it together"  . Coronary angioplasty with stent placement      "1 + +1"  . Iliac artery stent Bilateral   . Esophagogastroduodenoscopy N/A 10/04/2013    Procedure: ESOPHAGOGASTRODUODENOSCOPY (EGD);  Surgeon: Beryle Beams, MD;  Location: [facility] ENDOSCOPY;  Service: Endoscopy;  Laterality: N/A;  . Vaginal hysterectomy      for cancer   Family History  Problem Relation Age of Onset  . Heart attack Father   . Coronary artery disease Father   . Leukemia Mother   . Leukemia Brother    History  Substance Use Topics  . Smoking status: Current Every Day Smoker -- 0.50 packs/day for 56 years    Types: Cigarettes  . Smokeless tobacco: Never Used  . Alcohol Use: No     Comment: 10/03/2013 "I drink 2-3 times/yr"   OB History   Grav Para Term Preterm Abortions TAB SAB Ect Mult Living                 Review of Systems  Respiratory:  Positive for cough and shortness of breath.   All other systems reviewed and are negative.     Allergies  Uloric; Zolpidem tartrate; Ciprofloxacin; Contrast media; Tetracycline; and Vicodin  Home Medications   Prior to Admission medications   Medication Sig Start Date End Date Taking? Authorizing Provider  albuterol (PROAIR HFA) 108 (90 BASE) MCG/ACT inhaler Inhale 2 puffs into the lungs every 4 (four) hours as needed for wheezing or shortness of breath.    Historical Provider, MD  allopurinol (ZYLOPRIM) 100 MG tablet Take 100 mg by mouth 2 (two) times daily.      Historical Provider, MD  arformoterol (BROVANA) 15 MCG/2ML NEBU Take 15 mcg by nebulization daily.    Historical Provider, MD  aspirin 81 MG tablet Take 1 tablet (81 mg total) by mouth daily. 10/17/13   Charlynne Cousins, MD  budesonide (PULMICORT) 0.5 MG/2ML nebulizer solution Take 0.5 mg by nebulization 2 (two) times daily as  needed (shortness of breath).    Historical Provider, MD  calcitRIOL (ROCALTROL) 0.25 MCG capsule Take 0.25 mcg by mouth daily.    Historical Provider, MD  clopidogrel (PLAVIX) 75 MG tablet Take 1 tablet (75 mg total) by mouth daily. 10/17/13   Charlynne Cousins, MD  diltiazem (DILACOR XR) 240 MG 24 hr capsule Take 1 capsule (240 mg total) by mouth daily. 07/15/13   Thayer Headings, MD  famotidine (PEPCID) 20 MG tablet Take 20 mg by mouth 2 (two) times daily as needed for heartburn.     Historical Provider, MD  ferrous sulfate 325 (65 FE) MG tablet Take 1 tablet (325 mg total) by mouth daily with breakfast. 10/05/13   Charlynne Cousins, MD  furosemide (LASIX) 80 MG tablet Take 80-160 mg by mouth 2 (two) times daily. 160mg  in the morning and 80mg  in the evening    Historical Provider, MD  lactose free nutrition (BOOST PLUS) LIQD Take 237 mLs by mouth 2 (two) times daily between meals. 02/25/14   Geradine Girt, DO  methocarbamol (ROBAXIN) 500 MG tablet Take 1 tablet (500 mg total) by mouth 2 (two) times daily as needed for muscle spasms. 02/10/14   Viona Gilmore Cartner, PA-C  nicotine (NICODERM CQ - DOSED IN MG/24 HOURS) 14 mg/24hr patch Place 1 patch (14 mg total) onto the skin daily. 02/25/14   Geradine Girt, DO  nitroGLYCERIN (NITROSTAT) 0.4 MG SL tablet Place 0.4 mg under the tongue every 5 (five) minutes as needed for chest pain.    Historical Provider, MD  pantoprazole (PROTONIX) 40 MG tablet Take 40 mg by mouth daily.    Historical Provider, MD  polyethylene glycol (MIRALAX / GLYCOLAX) packet Take 34 g by mouth daily as needed for mild constipation.    Historical Provider, MD  predniSONE (DELTASONE) 20 MG tablet 40 mg x 5 days, then 30 mg x 5 days, then back to 20 mg daily 02/25/14   Geradine Girt, DO  Probiotic Product (ALIGN PO) Take 1 tablet by mouth daily.     Historical Provider, MD  rosuvastatin (CRESTOR) 20 MG tablet Take 1 tablet (20 mg total) by mouth daily. 07/15/13   Thayer Headings, MD   senna (SENOKOT) 8.6 MG TABS tablet Take 2 tablets (17.2 mg total) by mouth daily as needed for mild constipation. 02/25/14   Geradine Girt, DO  sodium phosphate (FLEET) 7-19 GM/118ML ENEM Place 133 mLs (1 enema total) rectally daily as needed for severe constipation. 02/20/14   Merryl Hacker,  MD  tiotropium (SPIRIVA) 18 MCG inhalation capsule Place 18 mcg into inhaler and inhale daily.    Historical Provider, MD  traMADol (ULTRAM) 50 MG tablet Take 50 mg by mouth every 6 (six) hours as needed for moderate pain.     Historical Provider, MD  venlafaxine XR (EFFEXOR-XR) 75 MG 24 hr capsule Take 75 mg by mouth daily.     Historical Provider, MD  vitamin B-12 (CYANOCOBALAMIN) 1000 MCG tablet Take 1,000 mcg by mouth daily.    Historical Provider, MD   BP 158/81  Pulse 98  Temp(Src) 98.1 F (36.7 C) (Oral)  Resp 19  SpO2 92% Physical Exam  Nursing note and vitals reviewed. Constitutional: She is oriented to person, place, and time.  Uncomfortable   HENT:  Head: Normocephalic.  Mouth/Throat: Oropharynx is clear and moist.  Eyes: Conjunctivae are normal. Pupils are equal, round, and reactive to light.  Neck: Normal range of motion. Neck supple.  Cardiovascular: Normal rate, regular rhythm and normal heart sounds.   Pulmonary/Chest:  Minimal wheezing throughout, no crackles, mod air movement   Abdominal: Soft. Bowel sounds are normal. She exhibits no distension. There is no tenderness. There is no rebound.  Musculoskeletal:  1+ edema bilaterally   Neurological: She is alert and oriented to person, place, and time.  Skin: Skin is warm and dry.  Psychiatric: She has a normal mood and affect. Her behavior is normal. Judgment and thought content normal.    ED Course  Procedures (including critical care time) Labs Review Labs Reviewed  CBC WITH DIFFERENTIAL - Abnormal; Notable for the following:    WBC 21.2 (*)    RBC 3.39 (*)    Hemoglobin 10.8 (*)    HCT 34.0 (*)    MCV 100.3 (*)     RDW 17.7 (*)    Neutrophils Relative % 96 (*)    Neutro Abs 20.4 (*)    Lymphocytes Relative 1 (*)    Lymphs Abs 0.3 (*)    All other components within normal limits  COMPREHENSIVE METABOLIC PANEL - Abnormal; Notable for the following:    Potassium 3.6 (*)    Chloride 91 (*)    Glucose, Bld 158 (*)    BUN 35 (*)    Creatinine, Ser 1.86 (*)    GFR calc non Af Amer 26 (*)    GFR calc Af Amer 30 (*)    Anion gap 18 (*)    All other components within normal limits  PRO B NATRIURETIC PEPTIDE - Abnormal; Notable for the following:    Pro B Natriuretic peptide (BNP) 2128.0 (*)    All other components within normal limits  I-STAT TROPOININ, ED - Abnormal; Notable for the following:    Troponin i, poc 0.09 (*)    All other components within normal limits  D-DIMER, QUANTITATIVE    Imaging Review Dg Chest 2 View  03/05/2014   CLINICAL DATA:  Shortness of breath, history COPD, coronary artery disease, smoking, hypertension, endometrial cancer  EXAM: CHEST  2 VIEW  COMPARISON:  02/22/2014  FINDINGS: Lordotic positioning.  Normal heart size, mediastinal contours and pulmonary vascularity.  Emphysematous and bronchitic changes consistent with COPD.  Bibasilar atelectasis versus scarring.  No acute infiltrate, pleural effusion, or pneumothorax.  Bones unremarkable.  IMPRESSION: COPD changes with bibasilar atelectasis versus scarring.  No acute abnormalities.   Electronically Signed   By: Lavonia Dana M.D.   On: 03/05/2014 13:13     EKG Interpretation   Date/Time:  Wednesday March 05 2014 11:52:31 EDT Ventricular Rate:  101 PR Interval:  135 QRS Duration: 132 QT Interval:  415 QTC Calculation: 538 R Axis:   145 Text Interpretation:  Sinus tachycardia Ventricular premature complex  Right bundle branch block TWI V4-V6 new since previous  Confirmed by YAO   MD, DAVID ([id number]) on 03/05/2014 11:59:03 AM      MDM   Final diagnoses:  None    Wanda Yang is a 73 y.o. female here  with wheezing, SOB. Likely COPD vs CHF. Patient has contrast dye allergy and only one kidney. Even though she was in the hospital recently, I think COPD vs CHF more likely. Will give nebs, check BNP, CXR, give steroids and likely admit.   2:06 PM WBC 21, but CXR showed no obvious pneumonia. This is likely from steroid use. Trop mildly elevated, likely demand ischemia from hypoxia. Given steroids, nebs, ASA. Held heparin. Hospitalist request D-dimer. Will admit to tele.    Wandra Arthurs, MD 03/05/14 [phone]

## 2014-03-05 NOTE — ED Notes (Signed)
Pt from home via [facility] with c/o increasing shortness of breath when leaving home to go to pulmonologist.  EMS reports wheezing, given 5 mg albuterol, wheezing decreased.  Pt in NAD, A&O.

## 2014-03-06 DIAGNOSIS — N183 Chronic kidney disease, stage 3 (moderate): Secondary | ICD-10-CM

## 2014-03-06 DIAGNOSIS — N179 Acute kidney failure, unspecified: Secondary | ICD-10-CM

## 2014-03-06 DIAGNOSIS — M79609 Pain in unspecified limb: Secondary | ICD-10-CM

## 2014-03-06 DIAGNOSIS — I5033 Acute on chronic diastolic (congestive) heart failure: Secondary | ICD-10-CM | POA: Insufficient documentation

## 2014-03-06 DIAGNOSIS — N189 Chronic kidney disease, unspecified: Secondary | ICD-10-CM

## 2014-03-06 DIAGNOSIS — J9621 Acute and chronic respiratory failure with hypoxia: Principal | ICD-10-CM

## 2014-03-06 LAB — COMPREHENSIVE METABOLIC PANEL
ALBUMIN: 3.3 g/dL — AB (ref 3.5–5.2)
ALK PHOS: 77 U/L (ref 39–117)
ALT: 18 U/L (ref 0–35)
AST: 17 U/L (ref 0–37)
Anion gap: 14 (ref 5–15)
BILIRUBIN TOTAL: 0.2 mg/dL — AB (ref 0.3–1.2)
BUN: 40 mg/dL — AB (ref 6–23)
CHLORIDE: 89 meq/L — AB (ref 96–112)
CO2: 31 mEq/L (ref 19–32)
Calcium: 9 mg/dL (ref 8.4–10.5)
Creatinine, Ser: 2.13 mg/dL — ABNORMAL HIGH (ref 0.50–1.10)
GFR calc Af Amer: 26 mL/min — ABNORMAL LOW (ref >90)
GFR calc non Af Amer: 22 mL/min — ABNORMAL LOW (ref >90)
Glucose, Bld: 145 mg/dL — ABNORMAL HIGH (ref 70–99)
POTASSIUM: 3.8 meq/L (ref 3.7–5.3)
SODIUM: 134 meq/L — AB (ref 137–147)
TOTAL PROTEIN: 6.2 g/dL (ref 6.0–8.3)

## 2014-03-06 LAB — GLUCOSE, CAPILLARY
Glucose-Capillary: 188 mg/dL — ABNORMAL HIGH (ref 70–99)
Glucose-Capillary: 179 mg/dL — ABNORMAL HIGH (ref 70–99)
Glucose-Capillary: 177 mg/dL — ABNORMAL HIGH (ref 70–99)

## 2014-03-06 LAB — CBC
HEMATOCRIT: 29.7 % — AB (ref 36.0–46.0)
Hemoglobin: 9.6 g/dL — ABNORMAL LOW (ref 12.0–15.0)
MCH: 31.1 pg (ref 26.0–34.0)
MCHC: 32.3 g/dL (ref 30.0–36.0)
MCV: 96.1 fL (ref 78.0–100.0)
Platelets: 205 10*3/uL (ref 150–400)
RBC: 3.09 MIL/uL — ABNORMAL LOW (ref 3.87–5.11)
RDW: 17.4 % — AB (ref 11.5–15.5)
WBC: 15.6 10*3/uL — AB (ref 4.0–10.5)

## 2014-03-06 LAB — TROPONIN I: Troponin I: <0.3 ng/mL (ref <0.30)

## 2014-03-06 NOTE — Progress Notes (Signed)
*  Preliminary Results* Bilateral lower extremity venous duplex completed. Bilateral lower extremities are negative for deep vein thrombosis. There is no evidence of Baker's cyst bilaterally.  03/06/2014  Maudry Mayhew, RVT, RDCS, RDMS

## 2014-03-06 NOTE — Progress Notes (Signed)
[facility] PROGRESS NOTE Assessment/Plan: Acute-on-chronic respiratory failure due to Acute on chronic Diastolic Heart failure: - Multifactorial as above - will start IV lasix, solumedrol, inhalers and antibiotics which were not started. - Home oxygen dependent on 3 L,  - V/q scan Low probability for acute pulmonary embolus.  Weakness , falls questionable lumbar spinal stenosis: - The patient will need an MRI of the lumbar spine.  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  - Hemoglobin stable, currently on plavix and ASA   CKD stage IV;  - Baseline Cr 1.6-2.0  CAD: - Continue aspirin Plavix  - Cardiac enzymes negative   Code Status: full  Family Communication: bedside  Disposition Plan: admit     Consultants:  none  Procedures:  cxr  Antibiotics:  levaquin  HPI/Subjective: SOB unchanged she relates is not improving.  Objective: Filed Vitals:   03/06/14 0806 03/06/14 1055 03/06/14 1141 03/06/14 1234  BP:  154/80    Pulse:  106    Temp:  98.3 F (36.8 C)    TempSrc:  Oral    Resp:  24    Height:      Weight:      SpO2: 94% 89% 92% 95%    Intake/Output Summary (Last 24 hours) at 03/06/14 1308 Last data filed at 03/06/14 1200  Gross per 24 hour  Intake   1020 ml  Output    602 ml  Net    418 ml   Filed Weights   03/05/14 1526 03/06/14 0648  Weight: 66.9 kg (147 lb 7.8 oz) 67.45 kg (148 lb 11.2 oz)    Exam:  General: Alert, awake, oriented x3, in no acute distress.  HEENT: No bruits, no goiter. +JVD Heart: Regular rate and rhythm. Lungs: poor air movement minimal wheezing Abdomen: Soft, nontender, nondistended, positive bowel sounds.   Data Reviewed: Basic Metabolic Panel:  Recent Labs Lab 03/05/14 1223 03/05/14 1443 03/06/14 0310  NA 138  --  134*  K 3.6*  --  3.8  CL 91*  --  89*  CO2 29  --  31  GLUCOSE 158*  --  145*  BUN 35*  --  40*  CREATININE 1.86* 2.03*  2.13*  CALCIUM 9.3  --  9.0   Liver Function Tests:  Recent Labs Lab 03/05/14 1223 03/05/14 1443 03/06/14 0310  AST 19 17 17   ALT 21 20 18   ALKPHOS 87 87 77  BILITOT 0.3 0.3 0.2*  PROT 7.0 7.1 6.2  ALBUMIN 3.5 3.7 3.3*   No results found for this basename: LIPASE, AMYLASE,  in the last 168 hours No results found for this basename: AMMONIA,  in the last 168 hours CBC:  Recent Labs Lab 03/05/14 1223 03/05/14 1443 03/06/14 0310  WBC 21.2* 18.7* 15.6*  NEUTROABS 20.4*  --   --   HGB 10.8* 11.0* 9.6*  HCT 34.0* 34.5* 29.7*  MCV 100.3* 100.3* 96.1  PLT 206 202 205   Cardiac Enzymes:  Recent Labs Lab 03/05/14 1443 03/05/14 2107 03/06/14 0310  TROPONINI <0.30 <0.30 <0.30   BNP (last 3 results)  Recent Labs  10/03/13 1143 03/05/14 1223  PROBNP 3256.0* 2128.0*   CBG:  Recent Labs Lab 03/06/14 0646 03/06/14 1203  GLUCAP 188* 179*    No results found for this or any previous visit (from the past 240 hour(s)).   Studies: Dg Chest 2 View  03/05/2014   CLINICAL DATA:  Shortness of breath,  history COPD, coronary artery disease, smoking, hypertension, endometrial cancer  EXAM: CHEST  2 VIEW  COMPARISON:  02/22/2014  FINDINGS: Lordotic positioning.  Normal heart size, mediastinal contours and pulmonary vascularity.  Emphysematous and bronchitic changes consistent with COPD.  Bibasilar atelectasis versus scarring.  No acute infiltrate, pleural effusion, or pneumothorax.  Bones unremarkable.  IMPRESSION: COPD changes with bibasilar atelectasis versus scarring.  No acute abnormalities.   Electronically Signed   By: Lavonia Dana M.D.   On: 03/05/2014 13:13   Nm Pulmonary Perf And Vent  03/05/2014   CLINICAL DATA:  Short of breath.  EXAM: NUCLEAR MEDICINE VENTILATION - PERFUSION LUNG SCAN  TECHNIQUE: Ventilation images were obtained in multiple projections using inhaled aerosol technetium 99 M DTPA. Perfusion images were obtained in multiple projections after intravenous  injection of Tc-37m MAA.  RADIOPHARMACEUTICALS:  40.0 mCi Tc-34m DTPA aerosol and 6.0 mCi Tc-43m MAA  COMPARISON:  Chest radiograph from 03/05/2014  FINDINGS: Chest x-ray: Chest radiograph shows hyperinflated lungs with interstitial changes suggestive of emphysema.  Ventilation: There is a a clumping of the aerosolized DTPA throughout the large airways of both lungs. This significantly diminishes exam detail. Areas of diminished ventilation are noted within the left upper lobe and right upper lobe as well as the lateral left lung base.  Perfusion: There is heterogeneous perfusion of both lungs. The distribution of the radiopharmaceutical however is much improved when compared with the ventilation images. No discrete wedge-shaped perfusion defects identified.  IMPRESSION: 1. Low probability for acute pulmonary embolus. 2. Findings are most likely due to obstructive pulmonary disease.   Electronically Signed   By: Kerby Moors M.D.   On: 03/05/2014 19:15    Scheduled Meds: . allopurinol  100 mg Oral BID  . arformoterol  15 mcg Nebulization Daily  . aspirin EC  81 mg Oral Daily  . atorvastatin  10 mg Oral q1800  . azithromycin  500 mg Intravenous Q24H  . cefTRIAXone (ROCEPHIN)  IV  1 g Intravenous Q24H  . clopidogrel  75 mg Oral Daily  . diltiazem  240 mg Oral Daily  . enoxaparin (LOVENOX) injection  30 mg Subcutaneous Q24H  . ferrous sulfate  325 mg Oral Q breakfast  . furosemide  160 mg Oral Q breakfast  . furosemide  80 mg Oral q1800  . ipratropium-albuterol  3 mL Nebulization QID  . lubiprostone  24 mcg Oral BID WC  . methylPREDNISolone (SOLU-MEDROL) injection  60 mg Intravenous Q12H  . nicotine  14 mg Transdermal Daily  . pantoprazole  40 mg Oral Daily  . sodium chloride  3 mL Intravenous Q12H  . sodium chloride  3 mL Intravenous Q12H  . tiotropium  18 mcg Inhalation Daily  . venlafaxine XR  75 mg Oral Daily   Continuous Infusions:    Charlynne Cousins  [facility] Pager  [phone]. If 8PM-8AM, please contact night-coverage at [email or web address], password [facility] 03/06/2014, 1:08 PM  LOS: 1 day

## 2014-03-06 NOTE — Progress Notes (Signed)
UR complete.  Keani Gotcher RN, MSN 

## 2014-03-07 ENCOUNTER — Other Ambulatory Visit: Payer: Federal, State, Local not specified - PPO

## 2014-03-07 DIAGNOSIS — J441 Chronic obstructive pulmonary disease with (acute) exacerbation: Secondary | ICD-10-CM

## 2014-03-07 DIAGNOSIS — I10 Essential (primary) hypertension: Secondary | ICD-10-CM

## 2014-03-07 DIAGNOSIS — E876 Hypokalemia: Secondary | ICD-10-CM

## 2014-03-07 LAB — BASIC METABOLIC PANEL
Anion gap: 18 — ABNORMAL HIGH (ref 5–15)
BUN: 45 mg/dL — ABNORMAL HIGH (ref 6–23)
CO2: 30 meq/L (ref 19–32)
Calcium: 9.3 mg/dL (ref 8.4–10.5)
Chloride: 88 mEq/L — ABNORMAL LOW (ref 96–112)
Creatinine, Ser: 2.47 mg/dL — ABNORMAL HIGH (ref 0.50–1.10)
GFR calc Af Amer: 21 mL/min — ABNORMAL LOW (ref >90)
GFR calc non Af Amer: 18 mL/min — ABNORMAL LOW (ref >90)
GLUCOSE: 170 mg/dL — AB (ref 70–99)
Potassium: 4.1 mEq/L (ref 3.7–5.3)
SODIUM: 136 meq/L — AB (ref 137–147)

## 2014-03-07 LAB — GLUCOSE, CAPILLARY
GLUCOSE-CAPILLARY: 203 mg/dL — AB (ref 70–99)
Glucose-Capillary: 169 mg/dL — ABNORMAL HIGH (ref 70–99)
Glucose-Capillary: 193 mg/dL — ABNORMAL HIGH (ref 70–99)

## 2014-03-07 MED ORDER — FUROSEMIDE 20 MG PO TABS
20.0000 mg | ORAL_TABLET | Freq: Every day | ORAL | Status: DC
  Administered 2014-03-07 – 2014-03-09 (×3): 20 mg via ORAL
  Filled 2014-03-07 (×4): qty 1

## 2014-03-07 MED ORDER — FUROSEMIDE 80 MG PO TABS
80.0000 mg | ORAL_TABLET | Freq: Every day | ORAL | Status: DC
  Administered 2014-03-08 – 2014-03-10 (×3): 80 mg via ORAL
  Filled 2014-03-07 (×4): qty 1

## 2014-03-07 MED ORDER — METHYLPREDNISOLONE SODIUM SUCC 125 MG IJ SOLR
60.0000 mg | Freq: Four times a day (QID) | INTRAMUSCULAR | Status: DC
  Administered 2014-03-07 – 2014-03-10 (×12): 60 mg via INTRAVENOUS
  Filled 2014-03-07 (×5): qty 0.96
  Filled 2014-03-07: qty 2
  Filled 2014-03-07 (×5): qty 0.96
  Filled 2014-03-07: qty 2
  Filled 2014-03-07 (×6): qty 0.96

## 2014-03-07 NOTE — Care Management (Signed)
Patient is active with [facility]. I will follow for discharge needs.  Thank you. Christa See RN BSN [place] [phone]

## 2014-03-07 NOTE — Progress Notes (Signed)
[facility] PROGRESS NOTE Assessment/Plan: Acute-on-chronic respiratory failure due to COPD exacerbation - Start IV lasix, but Cr increase d/c lasix - Cont IV solumedrol, inhalers and antibiotics. - Home oxygen dependent on 3 L,  - V/q scan Low probability for acute pulmonary embolus. - She desaturated with ambulation.  Weakness , falls questionable lumbar spinal stenosis: - Consult PT/OT.  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  - Hemoglobin stable, currently on plavix and ASA   CKD stage IV;  - Baseline Cr 1.8-2.0  CAD: - Continue Aspirin Plavix  - Cardiac Enzymes negative   Code Status: full  Family Communication: bedside  Disposition Plan: admit     Consultants:  none  Procedures:  cxr  Antibiotics:  levaquin  HPI/Subjective: SOB unchanged she relates is  improving.  Objective: Filed Vitals:   03/07/14 0636 03/07/14 0831 03/07/14 0839 03/07/14 0949  BP: 134/79   156/70  Pulse: 95     Temp: 98 F (36.7 C)     TempSrc: Oral     Resp: 20     Height:      Weight: 66.316 kg (146 lb 3.2 oz)     SpO2: 95% 88% 94%     Intake/Output Summary (Last 24 hours) at 03/07/14 1035 Last data filed at 03/07/14 0600  Gross per 24 hour  Intake    720 ml  Output   1276 ml  Net   -556 ml   Filed Weights   03/05/14 1526 03/06/14 0648 03/07/14 0636  Weight: 66.9 kg (147 lb 7.8 oz) 67.45 kg (148 lb 11.2 oz) 66.316 kg (146 lb 3.2 oz)    Exam:  General: Alert, awake, oriented x3, in no acute distress.  HEENT: No bruits, no goiter. - JVD Heart: Regular rate and rhythm. Lungs: poor air movement minimal wheezing Abdomen: Soft, nontender, nondistended, positive bowel sounds.   Data Reviewed: Basic Metabolic Panel:  Recent Labs Lab 03/05/14 1223 03/05/14 1443 03/06/14 0310 03/07/14 0340  NA 138  --  134* 136*  K 3.6*  --  3.8 4.1  CL 91*  --  89* 88*  CO2 29  --  31 30  GLUCOSE 158*  --   145* 170*  BUN 35*  --  40* 45*  CREATININE 1.86* 2.03* 2.13* 2.47*  CALCIUM 9.3  --  9.0 9.3   Liver Function Tests:  Recent Labs Lab 03/05/14 1223 03/05/14 1443 03/06/14 0310  AST 19 17 17   ALT 21 20 18   ALKPHOS 87 87 77  BILITOT 0.3 0.3 0.2*  PROT 7.0 7.1 6.2  ALBUMIN 3.5 3.7 3.3*   No results found for this basename: LIPASE, AMYLASE,  in the last 168 hours No results found for this basename: AMMONIA,  in the last 168 hours CBC:  Recent Labs Lab 03/05/14 1223 03/05/14 1443 03/06/14 0310  WBC 21.2* 18.7* 15.6*  NEUTROABS 20.4*  --   --   HGB 10.8* 11.0* 9.6*  HCT 34.0* 34.5* 29.7*  MCV 100.3* 100.3* 96.1  PLT 206 202 205   Cardiac Enzymes:  Recent Labs Lab 03/05/14 1443 03/05/14 2107 03/06/14 0310  TROPONINI <0.30 <0.30 <0.30   BNP (last 3 results)  Recent Labs  10/03/13 1143 03/05/14 1223  PROBNP 3256.0* 2128.0*   CBG:  Recent Labs Lab 03/06/14 0646 03/06/14 1203 03/06/14 1603 03/07/14 0558  GLUCAP 188* 179* 177* 169*    No results found for this or any previous visit (from  the past 240 hour(s)).   Studies: Dg Chest 2 View  03/05/2014   CLINICAL DATA:  Shortness of breath, history COPD, coronary artery disease, smoking, hypertension, endometrial cancer  EXAM: CHEST  2 VIEW  COMPARISON:  02/22/2014  FINDINGS: Lordotic positioning.  Normal heart size, mediastinal contours and pulmonary vascularity.  Emphysematous and bronchitic changes consistent with COPD.  Bibasilar atelectasis versus scarring.  No acute infiltrate, pleural effusion, or pneumothorax.  Bones unremarkable.  IMPRESSION: COPD changes with bibasilar atelectasis versus scarring.  No acute abnormalities.   Electronically Signed   By: Lavonia Dana M.D.   On: 03/05/2014 13:13   Nm Pulmonary Perf And Vent  03/05/2014   CLINICAL DATA:  Short of breath.  EXAM: NUCLEAR MEDICINE VENTILATION - PERFUSION LUNG SCAN  TECHNIQUE: Ventilation images were obtained in multiple projections using  inhaled aerosol technetium 99 M DTPA. Perfusion images were obtained in multiple projections after intravenous injection of Tc-73m MAA.  RADIOPHARMACEUTICALS:  40.0 mCi Tc-108m DTPA aerosol and 6.0 mCi Tc-65m MAA  COMPARISON:  Chest radiograph from 03/05/2014  FINDINGS: Chest x-ray: Chest radiograph shows hyperinflated lungs with interstitial changes suggestive of emphysema.  Ventilation: There is a a clumping of the aerosolized DTPA throughout the large airways of both lungs. This significantly diminishes exam detail. Areas of diminished ventilation are noted within the left upper lobe and right upper lobe as well as the lateral left lung base.  Perfusion: There is heterogeneous perfusion of both lungs. The distribution of the radiopharmaceutical however is much improved when compared with the ventilation images. No discrete wedge-shaped perfusion defects identified.  IMPRESSION: 1. Low probability for acute pulmonary embolus. 2. Findings are most likely due to obstructive pulmonary disease.   Electronically Signed   By: Kerby Moors M.D.   On: 03/05/2014 19:15    Scheduled Meds: . allopurinol  100 mg Oral BID  . arformoterol  15 mcg Nebulization Daily  . aspirin EC  81 mg Oral Daily  . atorvastatin  10 mg Oral q1800  . azithromycin  500 mg Intravenous Q24H  . cefTRIAXone (ROCEPHIN)  IV  1 g Intravenous Q24H  . clopidogrel  75 mg Oral Daily  . diltiazem  240 mg Oral Daily  . enoxaparin (LOVENOX) injection  30 mg Subcutaneous Q24H  . ferrous sulfate  325 mg Oral Q breakfast  . furosemide  20 mg Oral q1800  . [START ON 03/08/2014] furosemide  80 mg Oral Q breakfast  . ipratropium-albuterol  3 mL Nebulization QID  . lubiprostone  24 mcg Oral BID WC  . methylPREDNISolone (SOLU-MEDROL) injection  60 mg Intravenous Q12H  . nicotine  14 mg Transdermal Daily  . pantoprazole  40 mg Oral Daily  . sodium chloride  3 mL Intravenous Q12H  . sodium chloride  3 mL Intravenous Q12H  . tiotropium  18 mcg  Inhalation Daily  . venlafaxine XR  75 mg Oral Daily   Continuous Infusions:    Charlynne Cousins  [facility] Pager [phone]. If 8PM-8AM, please contact night-coverage at [email or web address], password [facility] 03/07/2014, 10:35 AM  LOS: 2 days

## 2014-03-07 NOTE — Progress Notes (Signed)
PT Cancellation Note  Patient Details Name: Wanda Yang MRN: [id number] DOB: January 09, 1941   Cancelled Treatment:    Reason Eval/Treat Not Completed: Medical issues which prohibited therapy.  Per RN, pt just transition to [facility] and has been desatting this am.  Will hold PT at this time and check back another time.     [name], Thornton Papas 03/07/2014, 10:02 AM

## 2014-03-08 ENCOUNTER — Inpatient Hospital Stay (HOSPITAL_COMMUNITY): Payer: Medicare (Managed Care)

## 2014-03-08 LAB — GLUCOSE, CAPILLARY
GLUCOSE-CAPILLARY: 262 mg/dL — AB (ref 70–99)
Glucose-Capillary: 159 mg/dL — ABNORMAL HIGH (ref 70–99)
Glucose-Capillary: 245 mg/dL — ABNORMAL HIGH (ref 70–99)

## 2014-03-08 LAB — BASIC METABOLIC PANEL
ANION GAP: 18 — AB (ref 5–15)
BUN: 49 mg/dL — ABNORMAL HIGH (ref 6–23)
CALCIUM: 9.4 mg/dL (ref 8.4–10.5)
CHLORIDE: 86 meq/L — AB (ref 96–112)
CO2: 28 mEq/L (ref 19–32)
CREATININE: 2.61 mg/dL — AB (ref 0.50–1.10)
GFR calc non Af Amer: 17 mL/min — ABNORMAL LOW (ref >90)
GFR, EST AFRICAN AMERICAN: 20 mL/min — AB (ref >90)
Glucose, Bld: 161 mg/dL — ABNORMAL HIGH (ref 70–99)
Potassium: 3.9 mEq/L (ref 3.7–5.3)
Sodium: 132 mEq/L — ABNORMAL LOW (ref 137–147)

## 2014-03-08 MED ORDER — ALPRAZOLAM 0.25 MG PO TABS
0.2500 mg | ORAL_TABLET | Freq: Once | ORAL | Status: AC
  Administered 2014-03-09: 0.25 mg via ORAL
  Filled 2014-03-08: qty 1

## 2014-03-08 MED ORDER — PHENOL 1.4 % MT LIQD
1.0000 | OROMUCOSAL | Status: DC | PRN
  Administered 2014-03-08: 1 via OROMUCOSAL
  Filled 2014-03-08 (×2): qty 177

## 2014-03-08 MED ORDER — AZITHROMYCIN 500 MG PO TABS
500.0000 mg | ORAL_TABLET | ORAL | Status: DC
  Administered 2014-03-08 – 2014-03-09 (×2): 500 mg via ORAL
  Filled 2014-03-08 (×3): qty 1

## 2014-03-08 NOTE — Progress Notes (Signed)
[facility] PROGRESS NOTE Assessment/Plan: Acute-on-chronic respiratory failure due to COPD exacerbation - Cont IV solumedrol, inhalers and antibiotics. - Home oxygen dependent on 3 L. - V/q scan Low probability for acute pulmonary embolus. - Check saturations with ambulation.  Weakness , falls questionable lumbar spinal stenosis: - Consult PT/OT.  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  - Hemoglobin stable, currently on plavix and ASA   CKD stage IV;  - Baseline Cr 1.8-2.0  CAD: - Continue Aspirin Plavix  - Cardiac Enzymes negative   Code Status: full  Family Communication: bedside  Disposition Plan: admit     Consultants:  none  Procedures:  cxr  Antibiotics:  levaquin  HPI/Subjective: SOB improved.  Objective: Filed Vitals:   03/07/14 2127 03/08/14 0300 03/08/14 0500 03/08/14 0722  BP:   169/73   Pulse: 97  81   Temp:   97.7 F (36.5 C)   TempSrc:   Oral   Resp: 20  20   Height:      Weight:  66 kg (145 lb 8.1 oz)    SpO2: 91%  96% 95%    Intake/Output Summary (Last 24 hours) at 03/08/14 1057 Last data filed at 03/08/14 0900  Gross per 24 hour  Intake   1123 ml  Output    750 ml  Net    373 ml   Filed Weights   03/06/14 0648 03/07/14 0636 03/08/14 0300  Weight: 67.45 kg (148 lb 11.2 oz) 66.316 kg (146 lb 3.2 oz) 66 kg (145 lb 8.1 oz)    Exam:  General: Alert, awake, oriented x3, in no acute distress.  HEENT: No bruits, no goiter. - JVD Heart: Regular rate and rhythm. Lungs: poor air movement minimal wheezing Abdomen: Soft, nontender, nondistended, positive bowel sounds.   Data Reviewed: Basic Metabolic Panel:  Recent Labs Lab 03/05/14 1223 03/05/14 1443 03/06/14 0310 03/07/14 0340 03/08/14 0349  NA 138  --  134* 136* 132*  K 3.6*  --  3.8 4.1 3.9  CL 91*  --  89* 88* 86*  CO2 29  --  31 30 28   GLUCOSE 158*  --  145* 170* 161*  BUN 35*  --  40* 45* 49*    CREATININE 1.86* 2.03* 2.13* 2.47* 2.61*  CALCIUM 9.3  --  9.0 9.3 9.4   Liver Function Tests:  Recent Labs Lab 03/05/14 1223 03/05/14 1443 03/06/14 0310  AST 19 17 17   ALT 21 20 18   ALKPHOS 87 87 77  BILITOT 0.3 0.3 0.2*  PROT 7.0 7.1 6.2  ALBUMIN 3.5 3.7 3.3*   No results found for this basename: LIPASE, AMYLASE,  in the last 168 hours No results found for this basename: AMMONIA,  in the last 168 hours CBC:  Recent Labs Lab 03/05/14 1223 03/05/14 1443 03/06/14 0310  WBC 21.2* 18.7* 15.6*  NEUTROABS 20.4*  --   --   HGB 10.8* 11.0* 9.6*  HCT 34.0* 34.5* 29.7*  MCV 100.3* 100.3* 96.1  PLT 206 202 205   Cardiac Enzymes:  Recent Labs Lab 03/05/14 1443 03/05/14 2107 03/06/14 0310  TROPONINI <0.30 <0.30 <0.30   BNP (last 3 results)  Recent Labs  10/03/13 1143 03/05/14 1223  PROBNP 3256.0* 2128.0*   CBG:  Recent Labs Lab 03/06/14 1603 03/07/14 0558 03/07/14 1055 03/07/14 1628 03/08/14 0654  GLUCAP 177* 169* 193* 203* 159*    No results found for this or any previous visit (from  the past 240 hour(s)).   Studies: No results found.  Scheduled Meds: . allopurinol  100 mg Oral BID  . arformoterol  15 mcg Nebulization Daily  . aspirin EC  81 mg Oral Daily  . atorvastatin  10 mg Oral q1800  . azithromycin  500 mg Intravenous Q24H  . cefTRIAXone (ROCEPHIN)  IV  1 g Intravenous Q24H  . clopidogrel  75 mg Oral Daily  . diltiazem  240 mg Oral Daily  . enoxaparin (LOVENOX) injection  30 mg Subcutaneous Q24H  . ferrous sulfate  325 mg Oral Q breakfast  . furosemide  20 mg Oral q1800  . furosemide  80 mg Oral Q breakfast  . ipratropium-albuterol  3 mL Nebulization QID  . lubiprostone  24 mcg Oral BID WC  . methylPREDNISolone (SOLU-MEDROL) injection  60 mg Intravenous Q6H  . nicotine  14 mg Transdermal Daily  . pantoprazole  40 mg Oral Daily  . sodium chloride  3 mL Intravenous Q12H  . sodium chloride  3 mL Intravenous Q12H  . tiotropium  18 mcg  Inhalation Daily  . venlafaxine XR  75 mg Oral Daily   Continuous Infusions:    Charlynne Cousins  [facility] Pager [phone]. If 8PM-8AM, please contact night-coverage at [email or web address], password [facility] 03/08/2014, 10:57 AM  LOS: 3 days

## 2014-03-08 NOTE — Evaluation (Signed)
Physical Therapy Evaluation Patient Details Name: Wanda Yang MRN: [id number] DOB: 02-01-1941 Today's Date: 03/08/2014   History of Present Illness  Pt is a 73 y.o. female with history of PVD, CAD, CKD, tobacco abuse with COPD and chronic hypoxic respiratory failure,O2 dependent 3L at home, presents to the ED with a fall, and worsening DOE. Symptoms started 3 days PTA. She complains of dyspnea with minimal activity and exertion. She has multiple bruises on her arms and legs and states that she had a fall 2 days ago. She has chronic back pain and her legs give out on her. She was supposed to have an MRI of the back day after admission at [facility].  Clinical Impression  Pt admitted with the above. Pt currently with functional limitations due to the deficits listed below (see PT Problem List). At the time of PT eval pt was limited by O2 status. On 4L/min at rest, sats were reading 78-79%. With 2-3 minutes of pursed-lip breathing sats increased to 88%. Per nursing, MD cleared sats to be between 88-90%. Pt required increased cueing for continued breathing techniques to maintain O2 sats. Pt will benefit from skilled PT to increase their independence and safety with mobility to allow discharge to the venue listed below. If pt cannot tolerate increased functional activity next session, may want to consider SNF for post-acute rehab.      Follow Up Recommendations Home health PT;Supervision/Assistance - 24 hour    Equipment Recommendations  Rolling walker with 5" wheels;3in1 (PT)    Recommendations for Other Services       Precautions / Restrictions Precautions Precautions: Fall Restrictions Weight Bearing Restrictions: No      Mobility  Bed Mobility Overal bed mobility: Needs Assistance Bed Mobility: Supine to Sit     Supine to sit: Min guard;HOB elevated     General bed mobility comments: Increased time and min guard to elevate trunk and scoot all the way to  EOB.  Transfers Overall transfer level: Needs assistance Equipment used: Rolling walker (2 wheeled) Transfers: Sit to/from Stand Sit to Stand: Min assist         General transfer comment: Cues for hand placement and technique. Assist to power-up to full standing and manage walker/lines when transferring to/from [facility].   Ambulation/Gait             General Gait Details: Transfers only due to O2 sats dropping with activity  Stairs            Wheelchair Mobility    Modified Rankin (Stroke Patients Only)       Balance Overall balance assessment: Needs assistance Sitting-balance support: Feet supported;No upper extremity supported Sitting balance-Leahy Scale: Fair     Standing balance support: Bilateral upper extremity supported;During functional activity Standing balance-Leahy Scale: Fair Standing balance comment: Pt can maintain standing without UE support, however for safety and energy conservation recommended the RW.                              Pertinent Vitals/Pain Pain Assessment: No/denies pain    Home Living Family/patient expects to be discharged to:: Private residence Living Arrangements: Spouse/significant other Available Help at Discharge: Family;Available 24 hours/day Type of Home: House Home Access: Stairs to enter Entrance Stairs-Rails: Left Entrance Stairs-Number of Steps: 4 Home Layout: Two level;Able to live on main level with bedroom/bathroom Home Equipment: Gilford Rile - 2 wheels;Cane - single point;Wheelchair - [employer];Shower seat - built in  Additional Comments: pt is 02 dependent at baseline    Prior Function Level of Independence: Needs assistance   Gait / Transfers Assistance Needed: Some days uses the RW all the time, some days don't use it at all  ADL's / Homemaking Assistance Needed: Occasionally needs husbands assist for bathing and dressing.         Hand Dominance   Dominant Hand: Right     Extremity/Trunk Assessment   Upper Extremity Assessment: Defer to OT evaluation           Lower Extremity Assessment: Generalized weakness      Cervical / Trunk Assessment: Normal  Communication   Communication: No difficulties  Cognition Arousal/Alertness: Lethargic;Suspect due to medications Behavior During Therapy: [facility] for tasks assessed/performed Overall Cognitive Status: Within Functional Limits for tasks assessed                      General Comments      Exercises        Assessment/Plan    PT Assessment Patient needs continued PT services  PT Diagnosis Difficulty walking   PT Problem List Decreased strength;Decreased range of motion;Decreased activity tolerance;Decreased balance;Decreased mobility;Decreased knowledge of use of DME;Decreased safety awareness;Decreased knowledge of precautions;Cardiopulmonary status limiting activity  PT Treatment Interventions DME instruction;Stair training;Gait training;Functional mobility training;Therapeutic activities;Therapeutic exercise;Neuromuscular re-education;Patient/family education   PT Goals (Current goals can be found in the Care Plan section) Acute Rehab PT Goals Patient Stated Goal: Home  PT Goal Formulation: With patient/family Time For Goal Achievement: 03/15/14 Potential to Achieve Goals: Good    Frequency Min 3X/week   Barriers to discharge        Co-evaluation               End of Session Equipment Utilized During Treatment: Gait belt;Oxygen Activity Tolerance: Patient limited by fatigue;Treatment limited secondary to medical complications (Comment) (dropping O2 sats) Patient left: in chair;with call bell/phone within reach;with family/visitor present Nurse Communication: Mobility status (O2 sats)         Time: 4268-3419 PT Time Calculation (min): 40 min   Charges:   PT Evaluation $Initial PT Evaluation Tier I: 1 Procedure PT Treatments $Therapeutic Activity: 23-37 mins    PT G Codes:          Rolinda Roan 03/08/2014, 3:26 PM  Rolinda Roan, PT, DPT Acute Rehabilitation Services Pager: [phone]

## 2014-03-08 NOTE — Progress Notes (Signed)
Hematoma on right hand assessed. Pressure dressing reapplied. No complaints of pain or numbness from patient. MD assessed hand, instructed to monitor.

## 2014-03-08 NOTE — Progress Notes (Signed)
Large hematoma noted across entire anterior area of right hand.  Site is a previous attempt to restart IV by Irfat RN.  Nurse instructed to place large pressure dressing to area.  Area is markedly bruised, dark purple in color.  Good capillary refill, able to make a fist.  Radial pulse palpable.  Multiple old bruising to right arm. IV site removed from left  posterior fore, pressure applied arm, and area dressed.  7  MD and charge nurse are aware of issue and have visualized the area.

## 2014-03-09 DIAGNOSIS — I5032 Chronic diastolic (congestive) heart failure: Secondary | ICD-10-CM

## 2014-03-09 DIAGNOSIS — E871 Hypo-osmolality and hyponatremia: Secondary | ICD-10-CM

## 2014-03-09 LAB — BLOOD GAS, ARTERIAL
Acid-Base Excess: 5 mmol/L — ABNORMAL HIGH (ref 0.0–2.0)
Bicarbonate: 29.3 mEq/L — ABNORMAL HIGH (ref 20.0–24.0)
Drawn by: 10552
O2 CONTENT: 4 L/min
O2 Saturation: 89.3 %
PATIENT TEMPERATURE: 98.6
PH ART: 7.426 (ref 7.350–7.450)
TCO2: 30.7 mmol/L (ref 0–100)
pCO2 arterial: 45.4 mmHg — ABNORMAL HIGH (ref 35.0–45.0)
pO2, Arterial: 60.3 mmHg — ABNORMAL LOW (ref 80.0–100.0)

## 2014-03-09 LAB — BASIC METABOLIC PANEL
ANION GAP: 17 — AB (ref 5–15)
BUN: 57 mg/dL — ABNORMAL HIGH (ref 6–23)
CHLORIDE: 83 meq/L — AB (ref 96–112)
CO2: 29 mEq/L (ref 19–32)
Calcium: 9.5 mg/dL (ref 8.4–10.5)
Creatinine, Ser: 2.8 mg/dL — ABNORMAL HIGH (ref 0.50–1.10)
GFR calc Af Amer: 18 mL/min — ABNORMAL LOW (ref >90)
GFR calc non Af Amer: 16 mL/min — ABNORMAL LOW (ref >90)
Glucose, Bld: 191 mg/dL — ABNORMAL HIGH (ref 70–99)
POTASSIUM: 4.1 meq/L (ref 3.7–5.3)
SODIUM: 129 meq/L — AB (ref 137–147)

## 2014-03-09 LAB — CBC
HEMATOCRIT: 30.4 % — AB (ref 36.0–46.0)
HEMOGLOBIN: 9.8 g/dL — AB (ref 12.0–15.0)
MCH: 30.4 pg (ref 26.0–34.0)
MCHC: 32.2 g/dL (ref 30.0–36.0)
MCV: 94.4 fL (ref 78.0–100.0)
Platelets: 210 10*3/uL (ref 150–400)
RBC: 3.22 MIL/uL — ABNORMAL LOW (ref 3.87–5.11)
RDW: 17.3 % — AB (ref 11.5–15.5)
WBC: 18.5 10*3/uL — AB (ref 4.0–10.5)

## 2014-03-09 LAB — GLUCOSE, CAPILLARY
GLUCOSE-CAPILLARY: 205 mg/dL — AB (ref 70–99)
GLUCOSE-CAPILLARY: 286 mg/dL — AB (ref 70–99)
Glucose-Capillary: 251 mg/dL — ABNORMAL HIGH (ref 70–99)
Glucose-Capillary: 204 mg/dL — ABNORMAL HIGH (ref 70–99)

## 2014-03-09 MED ORDER — LEVALBUTEROL HCL 0.63 MG/3ML IN NEBU
0.6300 mg | INHALATION_SOLUTION | Freq: Four times a day (QID) | RESPIRATORY_TRACT | Status: DC
  Administered 2014-03-09 – 2014-03-11 (×8): 0.63 mg via RESPIRATORY_TRACT
  Filled 2014-03-09 (×15): qty 3

## 2014-03-09 MED ORDER — LEVALBUTEROL HCL 0.63 MG/3ML IN NEBU: 0.6300 mg | INHALATION_SOLUTION | RESPIRATORY_TRACT | Status: DC | PRN

## 2014-03-09 MED ORDER — SODIUM CHLORIDE 0.9 % IV BOLUS (SEPSIS)
500.0000 mL | Freq: Once | INTRAVENOUS | Status: AC
  Administered 2014-03-09: 500 mL via INTRAVENOUS

## 2014-03-09 MED ORDER — SENNA 8.6 MG PO TABS: 2.0000 | ORAL_TABLET | Freq: Every day | ORAL | Status: DC | PRN

## 2014-03-09 MED ORDER — SODIUM CHLORIDE 0.9 % IV SOLN: 250.0000 mL | INTRAVENOUS | Status: DC | PRN

## 2014-03-09 NOTE — Progress Notes (Signed)
Patient unable to perform walking O2 sat test due to extreme SOB upon exertion on 4L O2. Current sat is 90% on 4L at rest. Patient is home O2 dependent on 3L.

## 2014-03-09 NOTE — Progress Notes (Signed)
[facility] PROGRESS NOTE Assessment/Plan: Acute-on-chronic respiratory failure due to COPD exacerbation - Cont IV solumedrol, inhalers and antibiotics. - Home oxygen dependent on 3 L. - V/q scan Low probability for acute pulmonary embolus. - pt will like to meet with PMT. - check ABG, repeat CXR and CBC.  Hyponatremia: - worsening due to decrease  intravascular vol. - start IV fluids check b-met in am.  Weakness , falls questionable lumbar spinal stenosis: - Consult PT/OT.  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  - Hemoglobin stable, currently on plavix and ASA. - no overt bleeding.  CKD stage IV;  - Baseline Cr 1.8-2.0  CAD: - Continue Aspirin Plavix  - Cardiac Enzymes negative  Back pain: - MRI as below, follow up with PCP, no intervention at this time due to her tenous pulmonary status.  Code Status: full  Family Communication: bedside  Disposition Plan: admit     Consultants:  none  Procedures:  cxr  Antibiotics:  levaquin  HPI/Subjective: SOB improved.  Objective: Filed Vitals:   03/08/14 1727 03/08/14 2028 03/08/14 2029 03/09/14 0543  BP: 167/63 158/71  139/61  Pulse: 91 98  81  Temp:  97.8 F (36.6 C)  97.6 F (36.4 C)  TempSrc:  Oral  Oral  Resp:  22  20  Height:      Weight:    69 kg (152 lb 1.9 oz)  SpO2:  90% 90% 93%    Intake/Output Summary (Last 24 hours) at 03/09/14 0820 Last data filed at 03/09/14 0700  Gross per 24 hour  Intake    480 ml  Output    800 ml  Net   -320 ml   Filed Weights   03/07/14 0636 03/08/14 0300 03/09/14 0543  Weight: 66.316 kg (146 lb 3.2 oz) 66 kg (145 lb 8.1 oz) 69 kg (152 lb 1.9 oz)    Exam:  General: Alert, awake, oriented x3, in no acute distress.  HEENT: No bruits, no goiter. - JVD Heart: Regular rate and rhythm. Lungs: poor air movement minimal wheezing Abdomen: Soft, nontender, nondistended, positive bowel sounds.    Data Reviewed: Basic Metabolic Panel:  Recent Labs Lab 03/05/14 1223 03/05/14 1443 03/06/14 0310 03/07/14 0340 03/08/14 0349 03/09/14 0416  NA 138  --  134* 136* 132* 129*  K 3.6*  --  3.8 4.1 3.9 4.1  CL 91*  --  89* 88* 86* 83*  CO2 29  --  $R'31 30 28 29  'Xo$ GLUCOSE 158*  --  145* 170* 161* 191*  BUN 35*  --  40* 45* 49* 57*  CREATININE 1.86* 2.03* 2.13* 2.47* 2.61* 2.80*  CALCIUM 9.3  --  9.0 9.3 9.4 9.5   Liver Function Tests:  Recent Labs Lab 03/05/14 1223 03/05/14 1443 03/06/14 0310  AST $Re'19 17 17  'Ixf$ ALT $R'21 20 18  'pr$ ALKPHOS 87 87 77  BILITOT 0.3 0.3 0.2*  PROT 7.0 7.1 6.2  ALBUMIN 3.5 3.7 3.3*   No results found for this basename: LIPASE, AMYLASE,  in the last 168 hours No results found for this basename: AMMONIA,  in the last 168 hours CBC:  Recent Labs Lab 03/05/14 1223 03/05/14 1443 03/06/14 0310  WBC 21.2* 18.7* 15.6*  NEUTROABS 20.4*  --   --   HGB 10.8* 11.0* 9.6*  HCT 34.0* 34.5* 29.7*  MCV 100.3* 100.3* 96.1  PLT 206 202 205   Cardiac Enzymes:  Recent Labs Lab 03/05/14 1443  03/05/14 2107 03/06/14 0310  TROPONINI <0.30 <0.30 <0.30   BNP (last 3 results)  Recent Labs  10/03/13 1143 03/05/14 1223  PROBNP 3256.0* 2128.0*   CBG:  Recent Labs Lab 03/07/14 1055 03/07/14 1628 03/08/14 0654 03/08/14 1239 03/08/14 1625  GLUCAP 193* 203* 159* 262* 245*    No results found for this or any previous visit (from the past 240 hour(s)).   Studies: Mr Lumbar Spine Wo Contrast  03/08/2014   CLINICAL DATA:  Status post fall.  Back pain.  Left-sided sciatica.  EXAM: MRI LUMBAR SPINE WITHOUT CONTRAST  TECHNIQUE: Multiplanar, multisequence MR imaging of the lumbar spine was performed. No intravenous contrast was administered.  COMPARISON:  CT abdomen and pelvis 02/22/2014.  FINDINGS: Mild convex right scoliosis is seen. No fracture. No worrisome marrow lesion with some scattered degenerative endplate signal change noted. 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 Atrophic and cystic right kidney is also seen.  The T11-12 level is imaged in sagittal plane only and negative.  T12-L1:  Negative.  L1-2:  Negative.  L2-3:  Facet degenerative disease.  Otherwise negative.  L3-4: There is a disc bulge with a superimposed down turning left lateral recess protrusion encroaching on the descending left L4 root. Foramina are open.  L4-5: Mild disc bulge without central canal or foraminal narrowing.  L5-S1: Small right paracentral and lateral recess protrusion encroaches on the descending right S1 root. Foramina are open.  IMPRESSION: Negative for fracture.  Down turning left lateral recess protrusion at L3-4 encroaches on the descending left L4 root.  Right lateral recess protrusion at L5-S1 mildly encroaches on descending right S1 root.  Descending abdominal aortic aneurysm seen on prior CT.   Electronically Signed   By: Inge Rise M.D.   On: 03/08/2014 14:54    Scheduled Meds: . allopurinol  100 mg Oral BID  . arformoterol  15 mcg Nebulization Daily  . aspirin EC  81 mg Oral Daily  . atorvastatin  10 mg Oral q1800  . azithromycin  500 mg Oral Q24H  . cefTRIAXone (ROCEPHIN)  IV  1 g Intravenous Q24H  . clopidogrel  75 mg Oral Daily  . diltiazem  240 mg Oral Daily  . enoxaparin (LOVENOX) injection  30 mg Subcutaneous Q24H  . ferrous sulfate  325 mg Oral Q breakfast  . furosemide  20 mg Oral q1800  . furosemide  80 mg Oral Q breakfast  . ipratropium-albuterol  3 mL Nebulization QID  . lubiprostone  24 mcg Oral BID WC  . methylPREDNISolone (SOLU-MEDROL) injection  60 mg Intravenous Q6H  . nicotine  14 mg Transdermal Daily  . pantoprazole  40 mg Oral Daily  . sodium chloride  3 mL Intravenous Q12H  . sodium chloride  3 mL Intravenous Q12H  . tiotropium  18 mcg Inhalation Daily  . venlafaxine XR  75 mg Oral Daily   Continuous Infusions:    Charlynne Cousins  [facility] Pager [phone]. If 8PM-8AM,  please contact night-coverage at [email or web address], password [facility] 03/09/2014, 8:20 AM  LOS: 4 days

## 2014-03-10 LAB — GLUCOSE, CAPILLARY
GLUCOSE-CAPILLARY: 282 mg/dL — AB (ref 70–99)
Glucose-Capillary: 192 mg/dL — ABNORMAL HIGH (ref 70–99)

## 2014-03-10 MED ORDER — MORPHINE SULFATE 4 MG/ML IJ SOLN
INTRAMUSCULAR | Status: AC
  Filled 2014-03-10: qty 1

## 2014-03-10 MED ORDER — MORPHINE SULFATE 2 MG/ML IJ SOLN
2.0000 mg | INTRAMUSCULAR | Status: DC | PRN
Start: 2014-03-10 — End: 2014-03-10
  Filled 2014-03-10: qty 1

## 2014-03-10 MED ORDER — SODIUM CHLORIDE 0.9 % IV SOLN
2.0000 mg/h | INTRAVENOUS | Status: DC
  Administered 2014-03-10: 2 mg/h via INTRAVENOUS
  Filled 2014-03-10: qty 10

## 2014-03-10 MED ORDER — FLUCONAZOLE IN SODIUM CHLORIDE 200-0.9 MG/100ML-% IV SOLN
200.0000 mg | Freq: Once | INTRAVENOUS | Status: AC
  Administered 2014-03-10: 200 mg via INTRAVENOUS
  Filled 2014-03-10: qty 100

## 2014-03-10 MED ORDER — LORAZEPAM 2 MG/ML IJ SOLN: 0.5000 mg | INTRAMUSCULAR | Status: DC | PRN

## 2014-03-10 MED ORDER — FLUCONAZOLE IN SODIUM CHLORIDE 200-0.9 MG/100ML-% IV SOLN
200.0000 mg | Freq: Once | INTRAVENOUS | Status: DC
  Filled 2014-03-10: qty 100

## 2014-03-10 MED ORDER — MORPHINE SULFATE 4 MG/ML IJ SOLN
4.0000 mg | INTRAMUSCULAR | Status: DC | PRN
  Administered 2014-03-10: 4 mg via INTRAVENOUS

## 2014-03-10 MED ORDER — FLUCONAZOLE 100MG IVPB: 100.0000 mg | INTRAVENOUS | Status: DC

## 2014-03-10 NOTE — Progress Notes (Addendum)
[facility] PROGRESS NOTE Assessment/Plan: Acute-on-chronic respiratory failure due to COPD exacerbation - OnIV solumedrol, inhalers and antibiotics. -  Pt respiratory status cont to be poor. - Pt relates she is tired of feeling like this, she would like to move toward comfort care. - Family was at bedside when she made this decision.  Hyponatremia: - see above.  Weakness , falls questionable lumbar spinal stenosis: Anemia of chronic disease: CKD stage IV;  CAD: Back pain:  Code Status: DNR Family Communication: bedside  Disposition Plan: admit     Consultants:  none  Procedures:  cxr  Antibiotics:  levaquin  HPI/Subjective: Pt had a rough night, not able to sleep and worsening SOB. Pt relate she want to stop treatment and move toward comfort care.  Objective: Filed Vitals:   03/09/14 2005 03/09/14 2124 03/10/14 0142 03/10/14 0537  BP:  165/52  160/58  Pulse:  93  90  Temp:  97.5 F (36.4 C)  98 F (36.7 C)  TempSrc:  Oral  Oral  Resp:  22  20  Height:      Weight:    68.887 kg (151 lb 13.9 oz)  SpO2: 94% 93% 91% 92%    Intake/Output Summary (Last 24 hours) at 03/10/14 0806 Last data filed at 03/10/14 0600  Gross per 24 hour  Intake    520 ml  Output   1100 ml  Net   -580 ml   Filed Weights   03/08/14 0300 03/09/14 0543 03/10/14 0537  Weight: 66 kg (145 lb 8.1 oz) 69 kg (152 lb 1.9 oz) 68.887 kg (151 lb 13.9 oz)    Exam:  General: Alert, awake, oriented x3, in no acute distress.  HEENT: No bruits, no goiter. - JVD Heart: Regular rate and rhythm. Lungs: poor air movement minimal wheezing   Data Reviewed: Basic Metabolic Panel:  Recent Labs Lab 03/05/14 1223 03/05/14 1443 03/06/14 0310 03/07/14 0340 03/08/14 0349 03/09/14 0416  NA 138  --  134* 136* 132* 129*  K 3.6*  --  3.8 4.1 3.9 4.1  CL 91*  --  89* 88* 86* 83*  CO2 29  --  31 30 28 29   GLUCOSE 158*  --  145* 170* 161* 191*  BUN 35*  --  40* 45* 49* 57*    CREATININE 1.86* 2.03* 2.13* 2.47* 2.61* 2.80*  CALCIUM 9.3  --  9.0 9.3 9.4 9.5   Liver Function Tests:  Recent Labs Lab 03/05/14 1223 03/05/14 1443 03/06/14 0310  AST 19 17 17   ALT 21 20 18   ALKPHOS 87 87 77  BILITOT 0.3 0.3 0.2*  PROT 7.0 7.1 6.2  ALBUMIN 3.5 3.7 3.3*   No results found for this basename: LIPASE, AMYLASE,  in the last 168 hours No results found for this basename: AMMONIA,  in the last 168 hours CBC:  Recent Labs Lab 03/05/14 1223 03/05/14 1443 03/06/14 0310 03/09/14 0906  WBC 21.2* 18.7* 15.6* 18.5*  NEUTROABS 20.4*  --   --   --   HGB 10.8* 11.0* 9.6* 9.8*  HCT 34.0* 34.5* 29.7* 30.4*  MCV 100.3* 100.3* 96.1 94.4  PLT 206 202 205 210   Cardiac Enzymes:  Recent Labs Lab 03/05/14 1443 03/05/14 2107 03/06/14 0310  TROPONINI <0.30 <0.30 <0.30   BNP (last 3 results)  Recent Labs  10/03/13 1143 03/05/14 1223  PROBNP 3256.0* 2128.0*   CBG:  Recent Labs Lab 03/09/14 0553 03/09/14 1136 03/09/14 1600 03/09/14 2122 03/10/14 0633  GLUCAP 205*  251* 204* 286* 192*    No results found for this or any previous visit (from the past 240 hour(s)).   Studies: Mr Lumbar Spine Wo Contrast  03/08/2014   CLINICAL DATA:  Status post fall.  Back pain.  Left-sided sciatica.  EXAM: MRI LUMBAR SPINE WITHOUT CONTRAST  TECHNIQUE: Multiplanar, multisequence MR imaging of the lumbar spine was performed. No intravenous contrast was administered.  COMPARISON:  CT abdomen and pelvis 02/22/2014.  FINDINGS: Mild convex right scoliosis is seen. No fracture. No worrisome marrow lesion with some scattered degenerative endplate signal change noted. Descending abdominal aortic aneurysm measuring 4 cm is identified as on CT. Atrophic and cystic right kidney is also seen.  The T11-12 level is imaged in sagittal plane only and negative.  T12-L1:  Negative.  L1-2:  Negative.  L2-3:  Facet degenerative disease.  Otherwise negative.  L3-4: There is a disc bulge with a  superimposed down turning left lateral recess protrusion encroaching on the descending left L4 root. Foramina are open.  L4-5: Mild disc bulge without central canal or foraminal narrowing.  L5-S1: Small right paracentral and lateral recess protrusion encroaches on the descending right S1 root. Foramina are open.  IMPRESSION: Negative for fracture.  Down turning left lateral recess protrusion at L3-4 encroaches on the descending left L4 root.  Right lateral recess protrusion at L5-S1 mildly encroaches on descending right S1 root.  Descending abdominal aortic aneurysm seen on prior CT.   Electronically Signed   By: Inge Rise M.D.   On: 03/08/2014 14:54    Scheduled Meds: . allopurinol  100 mg Oral BID  . arformoterol  15 mcg Nebulization Daily  . aspirin EC  81 mg Oral Daily  . atorvastatin  10 mg Oral q1800  . azithromycin  500 mg Oral Q24H  . cefTRIAXone (ROCEPHIN)  IV  1 g Intravenous Q24H  . clopidogrel  75 mg Oral Daily  . diltiazem  240 mg Oral Daily  . enoxaparin (LOVENOX) injection  30 mg Subcutaneous Q24H  . ferrous sulfate  325 mg Oral Q breakfast  . furosemide  20 mg Oral q1800  . furosemide  80 mg Oral Q breakfast  . levalbuterol  0.63 mg Nebulization Q6H  . lubiprostone  24 mcg Oral BID WC  . methylPREDNISolone (SOLU-MEDROL) injection  60 mg Intravenous Q6H  . nicotine  14 mg Transdermal Daily  . pantoprazole  40 mg Oral Daily  . sodium chloride  3 mL Intravenous Q12H  . sodium chloride  3 mL Intravenous Q12H  . tiotropium  18 mcg Inhalation Daily  . venlafaxine XR  75 mg Oral Daily   Continuous Infusions:    Charlynne Cousins  [facility] Pager [phone]. If 8PM-8AM, please contact night-coverage at [email or web address], password [facility] 03/10/2014, 8:06 AM  LOS: 5 days

## 2014-03-10 NOTE — Clinical Social Work Psychosocial (Signed)
     Clinical Social Work Department BRIEF PSYCHOSOCIAL ASSESSMENT 03/10/2014  Patient:  Wanda Yang, Wanda Yang     Account Number:  [id number]     Admit date:  03/05/2014  Clinical Social Worker:  Elam Dutch  Date/Time:  03/10/2014 02:30 PM  Referred by:  Physician  Date Referred:  03/10/2014 Referred for  Residential hospice placement   Other Referral:   Interview type:  Other - See comment Other interview type:   Patient, 2 daughters and 2 granddaughters    PSYCHOSOCIAL DATA Living Status:  ALONE Admitted from facility:   Level of care:   Primary support name:  Shaletha Humble  [phone] Primary support relationship to patient:  CHILD, ADULT Degree of support available:   Very strong support  Daughter:  Alona Bene  [phone]    CURRENT CONCERNS Current Concerns  Other - See comment   Other Concerns:   Residential Hospice Home placement    SOCIAL WORK ASSESSMENT / PLAN 73 year old female admitted from home due to acute exacerbation of COPD.  CSW notified by MD that patient has elected to seek comfort care and that residential hospice home placement is appropriate. CSW met with patient, 2 daughters and 2 granddaughters at bedside this afternoon. Patient is requesting [facility] as facility of choice. CSW notified Erling Conte- LCSW Liason for [facility].  She will meet with patient and famliy this afternoon to complete admission paperworker.  Dr. Aileen Fass notified of above.  Per Ms. Rosana Hoes- no bed available at [facility] today but anticipate a vacancy tomorrow.  Plan d/c tomorrow if ok per MD.   Assessment/plan status:  Psychosocial Support/Ongoing Assessment of Needs Other assessment/ plan:   Information/referral to community resources:   Residential Hospice Home referral    PATIENTS/FAMILYS RESPONSE TO PLAN OF CARE: Initially CSW thought that patient was sleeping at initiation of contact but she was awake and alert. She was involved in the discussion about  [facility] and stated that this was her preference. She was noted to be calm and denied any pain issues.  Her 2 daughters and 2 granddaughters were very tearful during the visit and asked many appropropriate questions about [facility].  One granddaughter asked about IV Morphine- "will this end things for her?"  CSW provided support and education as to how MD monitors meds closely and that at [facility] this would continue to happen and all efforts would be placed on pain control and confort.  CSW supported role of BP staff in end of life processes for patient and family's understanding.  CSW related that Erling Conte, SW Liaison would be available to support and and answer further questions.

## 2014-03-10 NOTE — Progress Notes (Signed)
Physical Therapy Discharge Patient Details Name: Wanda Yang MRN: [id number] DOB: April 09, 1941 Today's Date: 03/10/2014 Time:  -     Patient discharged from PT services secondary to medical decline - will need to re-order PT to resume therapy services.  Please see latest therapy progress note for current level of functioning and progress toward goals.    Progress and discharge plan discussed with patient and/or caregiver: Patient/Caregiver agrees with plan  GP     Rossie, [name] 03/10/2014, 12:08 PM

## 2014-03-11 LAB — GLUCOSE, CAPILLARY: Glucose-Capillary: 189 mg/dL — ABNORMAL HIGH (ref 70–99)

## 2014-03-11 MED ORDER — MORPHINE BOLUS VIA INFUSION
2.0000 mg | Freq: Once | INTRAVENOUS | Status: AC
  Administered 2014-03-11: 2 mg via INTRAVENOUS

## 2014-03-11 MED ORDER — SODIUM CHLORIDE 0.9 % IV SOLN: 3.0000 mg/h | INTRAVENOUS | Status: AC

## 2014-03-11 MED ORDER — LORAZEPAM 2 MG/ML IJ SOLN: 0.5000 mg | INTRAMUSCULAR | Status: AC | PRN

## 2014-03-11 MED ORDER — LEVALBUTEROL HCL 0.63 MG/3ML IN NEBU: 0.6300 mg | INHALATION_SOLUTION | Freq: Two times a day (BID) | RESPIRATORY_TRACT | Status: DC

## 2014-03-11 MED ORDER — MORPHINE BOLUS VIA INFUSION
2.0000 mg | Freq: Once | INTRAVENOUS | Status: DC
  Administered 2014-03-11: 2 mg via INTRAVENOUS

## 2014-03-11 NOTE — Progress Notes (Signed)
Pt DC via ambulance to [facility].  2 mg morphine bolus given prior to DC.  Pt resting comfortably.

## 2014-03-11 NOTE — Progress Notes (Addendum)
Discharge Packet has been made and discharge summary has been faxed to [facility]. SW talked to family and pt that the EMS transportation has been ordered for [facility]. Family did not have any further questions at this time pt and family are agreeable to discharge, nursing notified to call report. [place], [place] Intern, [phone]  I have reviewed and agree to above information.  Lorie Phenix. Murrell Redden [phone] SW Intern Supervisor Patient required 02 via ventimask; opens her eyes but not responsive per family.  Daughters at bedside- calm but tearful with distressed demeanors.  SW supportive provided prior to patient's d/c.  Lorie Phenix. [place], [place]

## 2014-03-11 NOTE — Discharge Summary (Signed)
Physician Discharge Summary  Wanda Yang IOM:[id number] DOB: 03-Dec-1940 DOA: 03/05/2014  PCP: Stephens Shire, MD  Admit date: 03/05/2014 Discharge date: 03/11/2014  Time spent: 35 minutes  Recommendations for Outpatient Follow-up:  1. Will go to [facility] at hospice with morphine GGT.  Discharge Diagnoses:  Active Problems:   Acute-on-chronic respiratory failure   CKD (chronic kidney disease) stage 3, GFR 30-59 ml/min   COPD with acute exacerbation   COPD exacerbation   Discharge Condition: stable  Diet recommendation: comfort feeds  Filed Weights   03/09/14 0543 03/10/14 0537 03/11/14 0625  Weight: 69 kg (152 lb 1.9 oz) 68.887 kg (151 lb 13.9 oz) 69 kg (152 lb 1.9 oz)    History of present illness:  73 y.o. female With history of peripheral vascular disease, coronary artery disease, chronic kidney disease, tobacco abuse with COPD and chronic hypoxic respiratory failure, on 3 L of oxygen at home, presents to the ED with a fall, worsening dyspnea on exertion. Symptoms started 3 days ago. She complains of dyspnea with minimal activity and exertion. Denies any chest pain but complains of "irregular heartbeat"on and off. She denies fever. She has had a nonproductive cough and was recently started on prednisone and Mucinex and azithromycin by her PCP. Currently she's taking prednisone 30 mg a day for suspected temporal arteritis.WBC 21, but CXR showed no obvious pneumonia. This is likely from steroid use. Trop mildly elevated, likely demand ischemia from hypoxia    Hospital Course:  Acute-on-chronic respiratory failure due to COPD exacerbation: - On IV solumedrol, inhalers and antibiotics. With slow improvement. - Pt respiratory status cont to be poor.  - after having a d/w family and pt's, she related she is tired of feeling like this, she would like to move toward comfort care.  - Family was at bedside when she made this decision.   Hyponatremia:  Weakness , falls questionable  lumbar spinal stenosis:  Anemia of chronic disease:  Procedures:  CXR  Consultations:  none  Discharge Exam: Filed Vitals:   03/11/14 0625  BP: 169/73  Pulse: 94  Temp: 97.5 F (36.4 C)  Resp: 18    General: A&O x3 Cardiovascular: RRR Respiratory: mod air movement  Discharge Instructions You were cared for by a hospitalist during your hospital stay. If you have any questions about your discharge medications or the care you received while you were in the hospital after you are discharged, you can call the unit and asked to speak with the hospitalist on call if the hospitalist that took care of you is not available. Once you are discharged, your primary care physician will handle any further medical issues. Please note that NO REFILLS for any discharge medications will be authorized once you are discharged, as it is imperative that you return to your primary care physician (or establish a relationship with a primary care physician if you do not have one) for your aftercare needs so that they can reassess your need for medications and monitor your lab values.  Discharge Instructions   Diet - low sodium heart healthy    Complete by:  As directed      Increase activity slowly    Complete by:  As directed           Current Discharge Medication List    START taking these medications   Details  LORazepam (ATIVAN) 2 MG/ML injection Inject 0.25 mLs (0.5 mg total) into the vein every 4 (four) hours as needed for anxiety or sedation. Qty:  1 mL, Refills: 0    morphine 100 mg in sodium chloride 0.9 % 90 mL Inject 3 mg/hr into the vein continuous. Qty: 1 Can, Refills: 0      STOP taking these medications     albuterol (PROAIR HFA) 108 (90 BASE) MCG/ACT inhaler      allopurinol (ZYLOPRIM) 100 MG tablet      arformoterol (BROVANA) 15 MCG/2ML NEBU      aspirin 81 MG tablet      budesonide (PULMICORT) 0.5 MG/2ML nebulizer solution      calcitRIOL (ROCALTROL) 0.25 MCG capsule       clopidogrel (PLAVIX) 75 MG tablet      diltiazem (DILACOR XR) 240 MG 24 hr capsule      famotidine (PEPCID) 20 MG tablet      ferrous sulfate 325 (65 FE) MG tablet      furosemide (LASIX) 80 MG tablet      lactose free nutrition (BOOST PLUS) LIQD      lubiprostone (AMITIZA) 24 MCG capsule      methocarbamol (ROBAXIN) 500 MG tablet      nicotine (NICODERM CQ - DOSED IN MG/24 HOURS) 14 mg/24hr patch      nitroGLYCERIN (NITROSTAT) 0.4 MG SL tablet      pantoprazole (PROTONIX) 40 MG tablet      polyethylene glycol (MIRALAX / GLYCOLAX) packet      predniSONE (DELTASONE) 20 MG tablet      Probiotic Product (ALIGN PO)      rosuvastatin (CRESTOR) 20 MG tablet      senna (SENOKOT) 8.6 MG TABS tablet      sodium phosphate (FLEET) 7-19 GM/118ML ENEM      tiotropium (SPIRIVA) 18 MCG inhalation capsule      traMADol (ULTRAM) 50 MG tablet      venlafaxine XR (EFFEXOR-XR) 75 MG 24 hr capsule      vitamin B-12 (CYANOCOBALAMIN) 1000 MCG tablet        Allergies  Allergen Reactions  . Uloric [Febuxostat] Other (See Comments)    Unknown reaction  . Zolpidem Tartrate Other (See Comments)    hallucinations  . Ciprofloxacin Anxiety and Other (See Comments)    Shakiness  . Contrast Media [Iodinated Diagnostic Agents] Rash  . Tetracycline Rash  . Vicodin [Hydrocodone-Acetaminophen] Anxiety      The results of significant diagnostics from this hospitalization (including imaging, microbiology, ancillary and laboratory) are listed below for reference.    Significant Diagnostic Studies: Ct Abdomen Pelvis Wo Contrast  02/22/2014   CLINICAL DATA:  Left lower quadrant abdominal pain. Renal insufficiency.  EXAM: CT ABDOMEN AND PELVIS WITHOUT CONTRAST  TECHNIQUE: Multidetector CT imaging of the abdomen and pelvis was performed following the standard protocol without IV contrast.  COMPARISON:  Radiographs obtained earlier today. CT colonoscopy dated 02/13/2012.  FINDINGS: Dilated,  fluid-filled colon containing loculated barium and stool. This becomes progressive lead less dilated extending into the sigmoid region width a transition to normal caliber sigmoid colon in the posterior aspect of the lower pelvis on the right. No small bowel dilatation. Normal passage oval contrast through the small bowel.  Bilateral renal cysts. Right renal atrophy. Distal abdominal aortic aneurysm with a maximum AP diameter of 3.9 cm on image number 32. No significant change in low density left adrenal masses. The largest measures 2.2 x 1.2 cm on image number 19 and 2 Hounsfield units in density on image 20. The next largest measures 1.3 x 1.2 cm on image number 16 and 10  Hounsfield units in density.  Unremarkable non contrasted appearance of the liver, spleen, pancreas, right adrenal gland an urinary bladder. Surgically absent uterus. No adnexal masses or enlarged lymph nodes. Distended gallbladder with no visible gallstones, wall thickening or pericholecystic fluid. Changes of COPD and chronic bronchitis at the lung bases with bibasilar linear atelectasis and scarring. Lumbar and lower thoracic spine degenerative changes.  IMPRESSION: 1. Colonic obstruction at the level of the distal sigmoid colon with prominent stool within that portion of the colon. This may be due to a stricture, not previously present. No visible mass. 2. 3.9 cm distal abdominal aortic aneurysm. 3. Left adrenal adenomas. 4. Bilateral renal cysts and right renal atrophy. 5. COPD and chronic bronchitis. 6. Distended gallbladder   Electronically Signed   By: Enrique Sack M.D.   On: 02/22/2014 09:17   Dg Chest 2 View  03/05/2014   CLINICAL DATA:  Shortness of breath, history COPD, coronary artery disease, smoking, hypertension, endometrial cancer  EXAM: CHEST  2 VIEW  COMPARISON:  02/22/2014  FINDINGS: Lordotic positioning.  Normal heart size, mediastinal contours and pulmonary vascularity.  Emphysematous and bronchitic changes consistent with  COPD.  Bibasilar atelectasis versus scarring.  No acute infiltrate, pleural effusion, or pneumothorax.  Bones unremarkable.  IMPRESSION: COPD changes with bibasilar atelectasis versus scarring.  No acute abnormalities.   Electronically Signed   By: Lavonia Dana M.D.   On: 03/05/2014 13:13   Dg Hip Complete Left  02/10/2014   CLINICAL DATA:  Left hip and leg pain.  EXAM: LEFT HIP - COMPLETE 2+ VIEW  COMPARISON:  None.  FINDINGS: No acute fracture or dislocation is identified. No significant arthropathy. The bony pelvis appears intact. No bony lesions or destruction. Bilateral iliac arterial stents identified. Iliac and femoral arteries are extensively calcified. No soft tissue abnormalities.  IMPRESSION: No acute fracture or significant arthropathy.   Electronically Signed   By: Aletta Edouard M.D.   On: 02/10/2014 13:25   Dg Femur Left  02/10/2014   CLINICAL DATA:  Left leg pain for 2 days, no trauma.  EXAM: LEFT FEMUR - 2 VIEW  COMPARISON:  None.  FINDINGS: There is no evidence of fracture or other focal bone lesions. Soft tissues are unremarkable. Vascular calcifications and left iliac stent are noted.  IMPRESSION: Negative.   Electronically Signed   By: Conchita Paris M.D.   On: 02/10/2014 13:24   Dg Abd 1 View  02/20/2014   CLINICAL DATA:  Decreased urine output. History of uterine cancer. Constipation.  EXAM: ABDOMEN - 1 VIEW  COMPARISON:  11/01/2012  FINDINGS: Right common and left external iliac artery stents. Atherosclerotic vascular calcifications.  Prominent stool throughout the colon favors constipation. No dilated small bowel observed. No compelling findings of renal or ureteral calculi.  IMPRESSION: 1.  Prominent stool throughout the colon favors constipation. 2. Atherosclerosis with iliac stents bilaterally.   Electronically Signed   By: Sherryl Barters M.D.   On: 02/20/2014 15:57   Mr Lumbar Spine Wo Contrast  03/08/2014   CLINICAL DATA:  Status post fall.  Back pain.  Left-sided sciatica.   EXAM: MRI LUMBAR SPINE WITHOUT CONTRAST  TECHNIQUE: Multiplanar, multisequence MR imaging of the lumbar spine was performed. No intravenous contrast was administered.  COMPARISON:  CT abdomen and pelvis 02/22/2014.  FINDINGS: Mild convex right scoliosis is seen. No fracture. No worrisome marrow lesion with some scattered degenerative endplate signal change noted. Descending abdominal aortic aneurysm measuring 4 cm is identified as on CT. Atrophic  and cystic right kidney is also seen.  The T11-12 level is imaged in sagittal plane only and negative.  T12-L1:  Negative.  L1-2:  Negative.  L2-3:  Facet degenerative disease.  Otherwise negative.  L3-4: There is a disc bulge with a superimposed down turning left lateral recess protrusion encroaching on the descending left L4 root. Foramina are open.  L4-5: Mild disc bulge without central canal or foraminal narrowing.  L5-S1: Small right paracentral and lateral recess protrusion encroaches on the descending right S1 root. Foramina are open.  IMPRESSION: Negative for fracture.  Down turning left lateral recess protrusion at L3-4 encroaches on the descending left L4 root.  Right lateral recess protrusion at L5-S1 mildly encroaches on descending right S1 root.  Descending abdominal aortic aneurysm seen on prior CT.   Electronically Signed   By: Inge Rise M.D.   On: 03/08/2014 14:54   Nm Pulmonary Perf And Vent  03/05/2014   CLINICAL DATA:  Short of breath.  EXAM: NUCLEAR MEDICINE VENTILATION - PERFUSION LUNG SCAN  TECHNIQUE: Ventilation images were obtained in multiple projections using inhaled aerosol technetium 99 M DTPA. Perfusion images were obtained in multiple projections after intravenous injection of Tc-38m MAA.  RADIOPHARMACEUTICALS:  40.0 mCi Tc-53m DTPA aerosol and 6.0 mCi Tc-38m MAA  COMPARISON:  Chest radiograph from 03/05/2014  FINDINGS: Chest x-ray: Chest radiograph shows hyperinflated lungs with interstitial changes suggestive of emphysema.   Ventilation: There is a a clumping of the aerosolized DTPA throughout the large airways of both lungs. This significantly diminishes exam detail. Areas of diminished ventilation are noted within the left upper lobe and right upper lobe as well as the lateral left lung base.  Perfusion: There is heterogeneous perfusion of both lungs. The distribution of the radiopharmaceutical however is much improved when compared with the ventilation images. No discrete wedge-shaped perfusion defects identified.  IMPRESSION: 1. Low probability for acute pulmonary embolus. 2. Findings are most likely due to obstructive pulmonary disease.   Electronically Signed   By: Kerby Moors M.D.   On: 03/05/2014 19:15   Dg Abd Acute W/chest  02/22/2014   CLINICAL DATA:  Evaluate for small bowel obstruction.  EXAM: ACUTE ABDOMEN SERIES (ABDOMEN 2 VIEW & CHEST 1 VIEW)  COMPARISON:  Abdominal radiograph February 20, 2014 and chest radiograph October 03, 2013  FINDINGS: The cardiac silhouette is mildly enlarged, similar. Similar moderate interstitial prominence, increased lung lung volumes with linear bibasilar densities. No pleural effusions or focal consolidations. No pneumothorax. Soft tissue planes and included osseous structures are nonsuspicious.  Paucity of bowel gas. A few scattered air-fluid levels on the lateral cubitus view, no intraperitoneal free air. RIGHT Common iliac and LEFT apparent external iliac artery stents. Moderate vascular calcifications. Bone mineral density is decreased without destructive bony lesions.  IMPRESSION: Mild cardiomegaly, stable COPD and bibasilar atelectasis versus scarring.  Paucity of bowel gas, nonspecific bowel gas pattern. Fluid filled small bowel obstruction not excluded, if clinically indicated CT of the abdomen and pelvis with contrast would be more sensitive.   Electronically Signed   By: Elon Alas   On: 02/22/2014 05:34   Dg Colon W/water Sol Cm  02/22/2014   CLINICAL DATA:   Constipation with last bowel movement 7 days ago  EXAM: COLON WITH WATER soluble contrast  COMPARISON:  CT scan of the abdomen and pelvis of today's date and supine film of the abdomen also of today's date.  FINDINGS: The scout film reveals CT contrast within small and large bowel  in the mid and lower abdomen. There are known loops of mildly distended fluid filled ascending, transverse, and descending colon and considerable stool in the rectosigmoid on the CT scan.  The rectum was catheterized and the balloon inflated under fluoroscopic guidance. Filling of the rectosigmoid immediately revealed floating stool balls within the normal calibered distal and mid sigmoid. More proximally a stool ball appeared impacted in the proximal sigmoid. Some contrast did flow around this. However, at that time the patient expelled the balloon. The sigmoid then decompressed and a stool ball was passed. The rectum was again catheterized and the balloon inflated to a higher volume. Refilling of the bowel was performed. Similar findings of a normal caliber sigmoid with multiple stool balls were again demonstrated. The patient then expelled the catheter once again. The study was then terminated.  IMPRESSION: Only the sigmoid could be visualized on this study. The anal sphincter was mildly patulous. Several large stool balls were noted to be in the sigmoid both fluoroscopically and on the earlier CT scan. The visualized portions of the lumen appeared normal. Some passage of stool occurred. It is hoped that the osmotic affects of the Gastrografin well help stimulate passage of additional stool.  The patient tolerated the procedure reasonably well.   Electronically Signed   By: David  Martinique   On: 02/22/2014 12:42    Microbiology: No results found for this or any previous visit (from the past 240 hour(s)).   Labs: Basic Metabolic Panel:  Recent Labs Lab 03/05/14 1223 03/05/14 1443 03/06/14 0310 03/07/14 0340 03/08/14 0349  03/09/14 0416  NA 138  --  134* 136* 132* 129*  K 3.6*  --  3.8 4.1 3.9 4.1  CL 91*  --  89* 88* 86* 83*  CO2 29  --  31 30 28 29   GLUCOSE 158*  --  145* 170* 161* 191*  BUN 35*  --  40* 45* 49* 57*  CREATININE 1.86* 2.03* 2.13* 2.47* 2.61* 2.80*  CALCIUM 9.3  --  9.0 9.3 9.4 9.5   Liver Function Tests:  Recent Labs Lab 03/05/14 1223 03/05/14 1443 03/06/14 0310  AST 19 17 17   ALT 21 20 18   ALKPHOS 87 87 77  BILITOT 0.3 0.3 0.2*  PROT 7.0 7.1 6.2  ALBUMIN 3.5 3.7 3.3*   No results found for this basename: LIPASE, AMYLASE,  in the last 168 hours No results found for this basename: AMMONIA,  in the last 168 hours CBC:  Recent Labs Lab 03/05/14 1223 03/05/14 1443 03/06/14 0310 03/09/14 0906  WBC 21.2* 18.7* 15.6* 18.5*  NEUTROABS 20.4*  --   --   --   HGB 10.8* 11.0* 9.6* 9.8*  HCT 34.0* 34.5* 29.7* 30.4*  MCV 100.3* 100.3* 96.1 94.4  PLT 206 202 205 210   Cardiac Enzymes:  Recent Labs Lab 03/05/14 1443 03/05/14 2107 03/06/14 0310  TROPONINI <0.30 <0.30 <0.30   BNP: BNP (last 3 results)  Recent Labs  10/03/13 1143 03/05/14 1223  PROBNP 3256.0* 2128.0*   CBG:  Recent Labs Lab 03/09/14 1600 03/09/14 2122 03/10/14 0633 03/10/14 2110 03/11/14 0622  GLUCAP 204* 286* 192* 282* 189*       Signed:  Charlynne Cousins  [facility] 03/11/2014, 9:33 AM

## 2014-03-11 NOTE — Consult Note (Signed)
[place] Liaison: Received request from [facility] 03/10/14 for family interest in [facility]. Chart reviewed, received report from bedside RN and charge RN. Met with patient's spouse, daughter Jenny Reichmann and grand Hinton Dyer in work room to answer questions, offer support and complete paper work. Offered to wait until this morning after patient assessed to complete paper work but spouse wanted to complete paper work at that time. Dr. Orpah Melter to assume care at [facility] per spouse's preference. [facility] room available today if transfer still makes sense. Please fax discharge summary to [phone] and RN please call report to [phone]. Please arrange transport for patient to arrive before noon. Thank you. Erling Conte LCSW [phone]

## 2014-03-12 ENCOUNTER — Inpatient Hospital Stay
Admission: RE | Admit: 2014-03-12 | Discharge: 2014-03-12 | Disposition: A | Discharge status: Home / Self Care | Payer: Federal, State, Local not specified - PPO | Source: Ambulatory Visit | Attending: Family Medicine | Admitting: Family Medicine

## 2014-03-21 ENCOUNTER — Other Ambulatory Visit: Payer: Self-pay

## 2014-04-06 DEATH — deceased

## 2016-01-23 IMAGING — CT CT ABD-PELV W/O CM
2 of 4 series · 12 of 46 positions shown, 14 images · non-contrast
Comparison: Radiographs obtained earlier today. CT colonoscopy
dated 02/13/2012.

CLINICAL DATA: Left lower quadrant abdominal pain. Renal
insufficiency.

EXAM:
CT ABDOMEN AND PELVIS WITHOUT CONTRAST
TECHNIQUE: Multidetector CT imaging of the abdomen and pelvis was performed
following the standard protocol without IV contrast.

[Series 201: routine, idose (2) · axial · 0.78mm/px · z∈[-462,-102]mm · 9 of 88 slices shown, 11 images]
[im 8/88  soft-tissue]
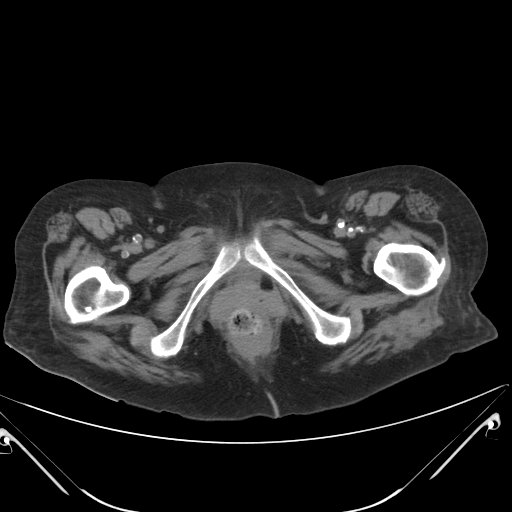
[im 8/88  bone]
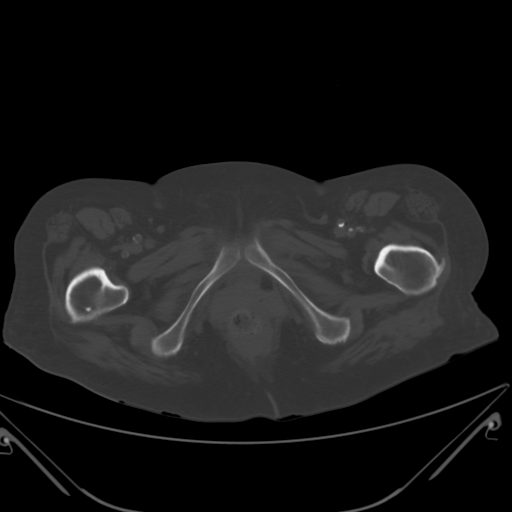
[im 15/88  soft-tissue]
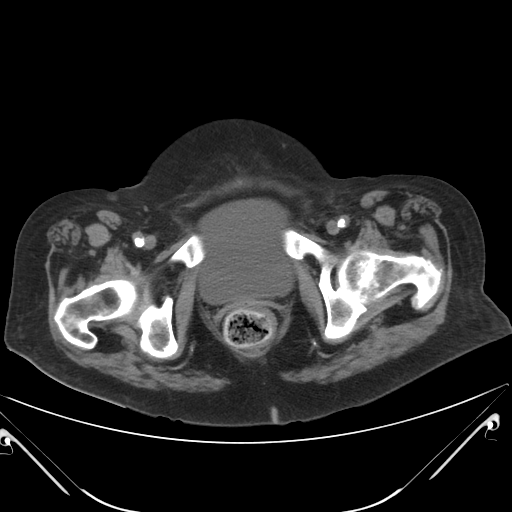
[im 26/88  soft-tissue]
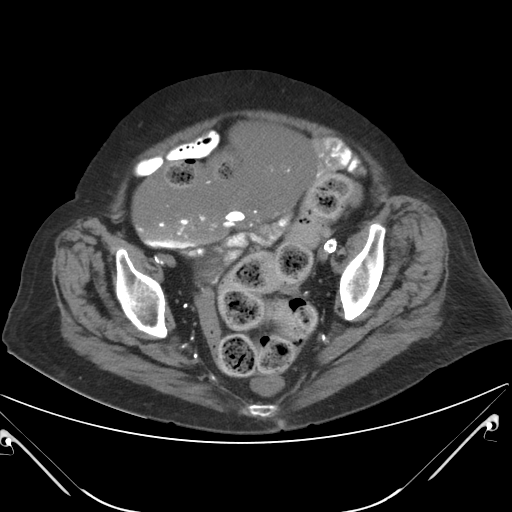
[im 33/88  soft-tissue]
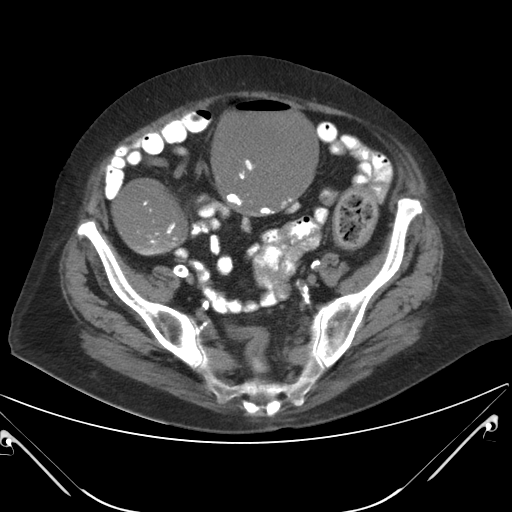
[im 44/88  soft-tissue]
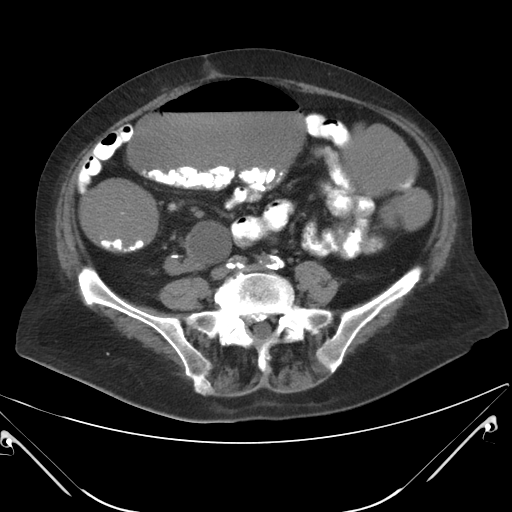
[im 55/88  soft-tissue]
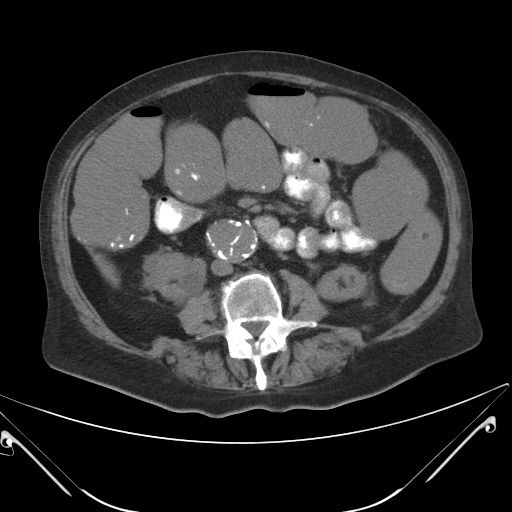
[im 62/88  soft-tissue]
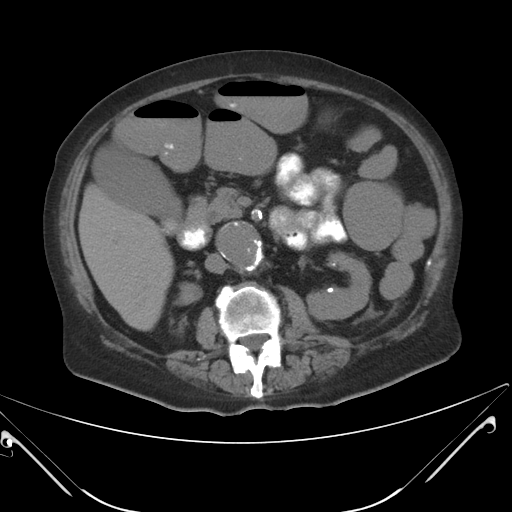
[im 73/88  soft-tissue]
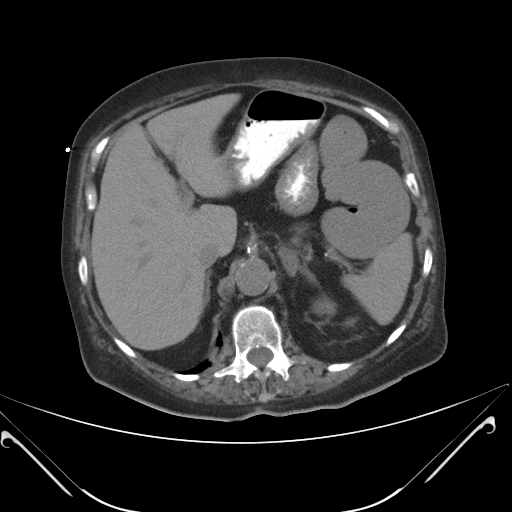
[im 80/88  soft-tissue]
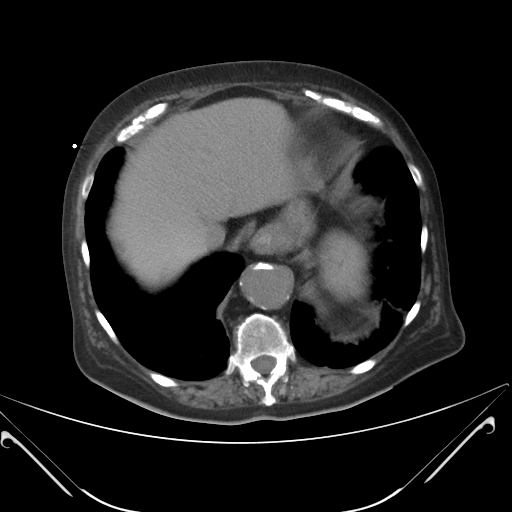
[im 80/88  bone]
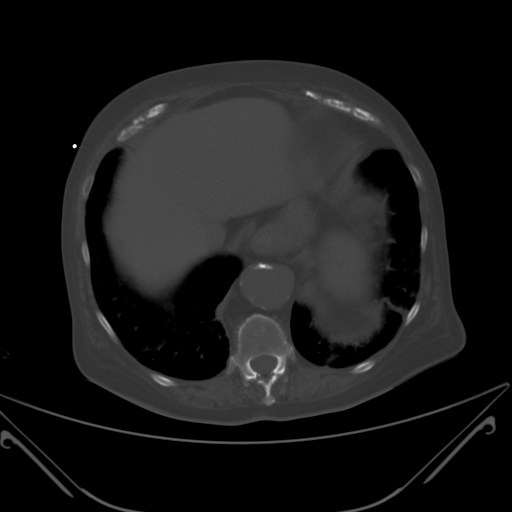

[Series 203: coronals, idose (2) · coronal · 0.50mm/px · 3 of 125 slices shown]
[im 42/125  soft-tissue]
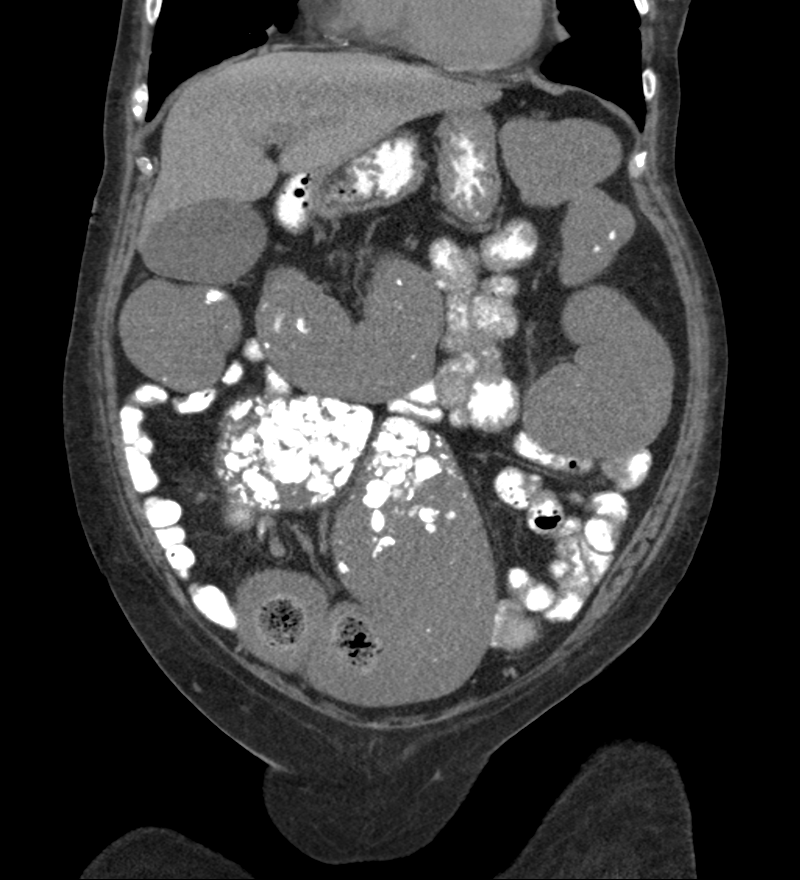
[im 56/125  soft-tissue]
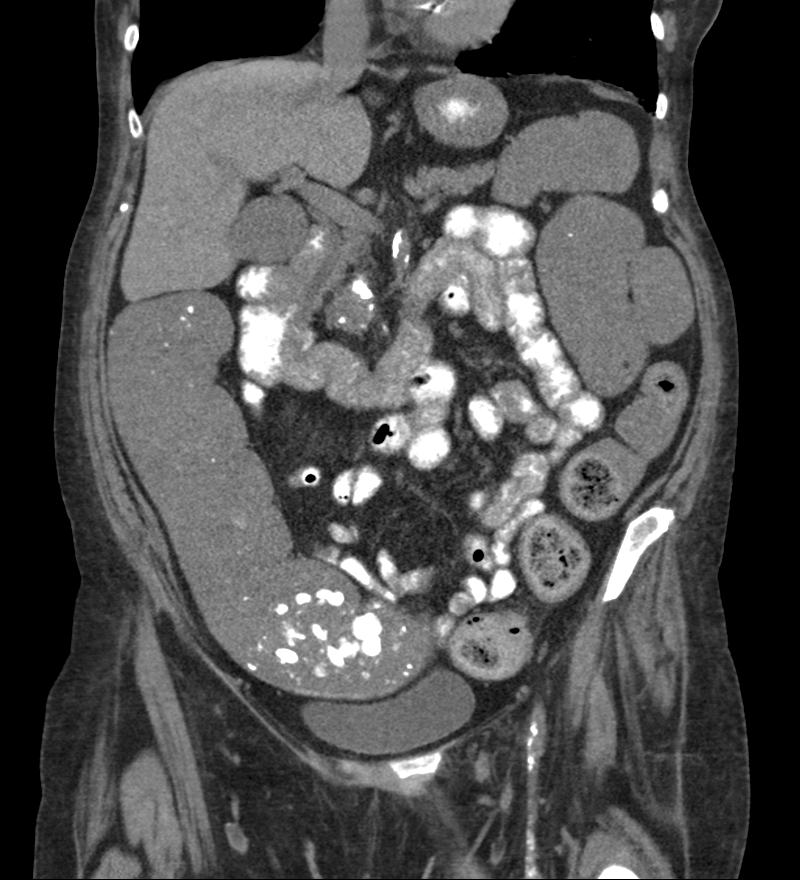
[im 69/125  soft-tissue]
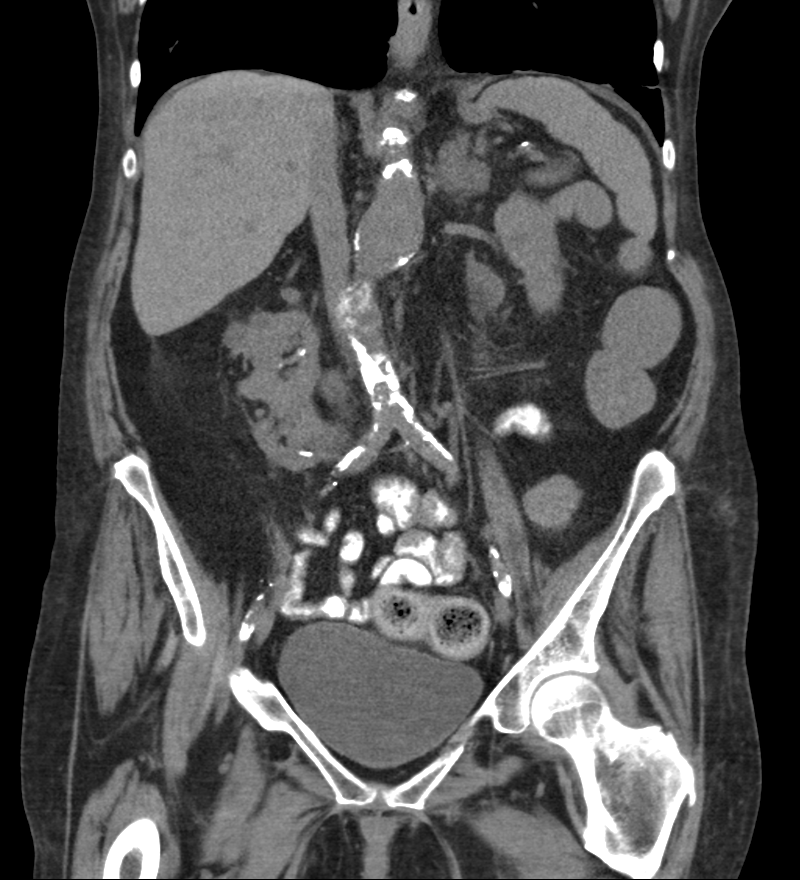

[12 of 46 positions shown; findings below may reference images not displayed]

FINDINGS: Dilated, fluid-filled colon containing loculated barium and stool.
This becomes progressive lead less dilated extending into the
sigmoid region width a transition to normal caliber sigmoid colon in
the posterior aspect of the lower pelvis on the right. No small
bowel dilatation. Normal passage oval contrast through the small
bowel.

Bilateral renal cysts. Right renal atrophy. Distal abdominal aortic
aneurysm with a maximum AP diameter of 3.9 cm on image number 32. No
significant change in low density left adrenal masses. The largest
measures 2.2 x 1.2 cm on image number 19 and 2 Hounsfield units in
density on image 20. The next largest measures 1.3 x 1.2 cm on image
number 16 and 10 Hounsfield units in density.

Unremarkable non contrasted appearance of the liver, spleen,
pancreas, right adrenal gland an urinary bladder. Surgically absent
uterus. No adnexal masses or enlarged lymph nodes. Distended
gallbladder with no visible gallstones, wall thickening or
pericholecystic fluid. Changes of COPD and chronic bronchitis at the
lung bases with bibasilar linear atelectasis and scarring. Lumbar
and lower thoracic spine degenerative changes.
IMPRESSION: 1. Colonic obstruction at the level of the distal sigmoid colon with
prominent stool within that portion of the colon. This may be due to
a stricture, not previously present. No visible mass.
2. 3.9 cm distal abdominal aortic aneurysm.
3. Left adrenal adenomas.
4. Bilateral renal cysts and right renal atrophy.
5. COPD and chronic bronchitis.
6. Distended gallbladder
